# Patient Record
Sex: Female | Born: 1945 | Race: White | Hispanic: No | Marital: Married | Smoking: Former smoker
Health system: Southern US, Community
[De-identification: ages and names within clinical notes are randomized; demographics above are authoritative.]

## PROBLEM LIST (undated history)

## (undated) DIAGNOSIS — C719 Malignant neoplasm of brain, unspecified: Secondary | ICD-10-CM

## (undated) DIAGNOSIS — I10 Essential (primary) hypertension: Secondary | ICD-10-CM

## (undated) HISTORY — DX: Malignant neoplasm of brain, unspecified: C71.9

---

## 2011-09-11 DIAGNOSIS — E785 Hyperlipidemia, unspecified: Secondary | ICD-10-CM | POA: Diagnosis not present

## 2011-09-11 DIAGNOSIS — I10 Essential (primary) hypertension: Secondary | ICD-10-CM | POA: Diagnosis not present

## 2012-03-11 DIAGNOSIS — F17200 Nicotine dependence, unspecified, uncomplicated: Secondary | ICD-10-CM | POA: Diagnosis not present

## 2012-03-11 DIAGNOSIS — I10 Essential (primary) hypertension: Secondary | ICD-10-CM | POA: Diagnosis not present

## 2012-03-11 DIAGNOSIS — E785 Hyperlipidemia, unspecified: Secondary | ICD-10-CM | POA: Diagnosis not present

## 2012-04-17 DIAGNOSIS — H52229 Regular astigmatism, unspecified eye: Secondary | ICD-10-CM | POA: Diagnosis not present

## 2012-04-17 DIAGNOSIS — H5200 Hypermetropia, unspecified eye: Secondary | ICD-10-CM | POA: Diagnosis not present

## 2012-04-17 DIAGNOSIS — H524 Presbyopia: Secondary | ICD-10-CM | POA: Diagnosis not present

## 2012-04-17 DIAGNOSIS — H40019 Open angle with borderline findings, low risk, unspecified eye: Secondary | ICD-10-CM | POA: Diagnosis not present

## 2012-04-28 DIAGNOSIS — H524 Presbyopia: Secondary | ICD-10-CM | POA: Diagnosis not present

## 2012-04-28 DIAGNOSIS — H40019 Open angle with borderline findings, low risk, unspecified eye: Secondary | ICD-10-CM | POA: Diagnosis not present

## 2014-04-08 DIAGNOSIS — E785 Hyperlipidemia, unspecified: Secondary | ICD-10-CM | POA: Diagnosis not present

## 2014-04-08 DIAGNOSIS — I10 Essential (primary) hypertension: Secondary | ICD-10-CM | POA: Diagnosis not present

## 2014-04-11 DIAGNOSIS — L299 Pruritus, unspecified: Secondary | ICD-10-CM | POA: Diagnosis not present

## 2014-04-11 DIAGNOSIS — E785 Hyperlipidemia, unspecified: Secondary | ICD-10-CM | POA: Diagnosis not present

## 2014-04-11 DIAGNOSIS — I10 Essential (primary) hypertension: Secondary | ICD-10-CM | POA: Diagnosis not present

## 2014-06-07 DIAGNOSIS — I10 Essential (primary) hypertension: Secondary | ICD-10-CM | POA: Diagnosis not present

## 2014-10-31 DIAGNOSIS — E782 Mixed hyperlipidemia: Secondary | ICD-10-CM | POA: Diagnosis not present

## 2014-10-31 DIAGNOSIS — R7301 Impaired fasting glucose: Secondary | ICD-10-CM | POA: Diagnosis not present

## 2014-10-31 DIAGNOSIS — I10 Essential (primary) hypertension: Secondary | ICD-10-CM | POA: Diagnosis not present

## 2014-11-02 DIAGNOSIS — I10 Essential (primary) hypertension: Secondary | ICD-10-CM | POA: Diagnosis not present

## 2014-11-02 DIAGNOSIS — E782 Mixed hyperlipidemia: Secondary | ICD-10-CM | POA: Diagnosis not present

## 2015-05-11 DIAGNOSIS — I10 Essential (primary) hypertension: Secondary | ICD-10-CM | POA: Diagnosis not present

## 2015-05-11 DIAGNOSIS — E782 Mixed hyperlipidemia: Secondary | ICD-10-CM | POA: Diagnosis not present

## 2015-05-16 DIAGNOSIS — E782 Mixed hyperlipidemia: Secondary | ICD-10-CM | POA: Diagnosis not present

## 2015-05-16 DIAGNOSIS — R7301 Impaired fasting glucose: Secondary | ICD-10-CM | POA: Diagnosis not present

## 2015-05-16 DIAGNOSIS — I10 Essential (primary) hypertension: Secondary | ICD-10-CM | POA: Diagnosis not present

## 2015-08-10 DIAGNOSIS — H02834 Dermatochalasis of left upper eyelid: Secondary | ICD-10-CM | POA: Diagnosis not present

## 2015-11-17 DIAGNOSIS — I10 Essential (primary) hypertension: Secondary | ICD-10-CM | POA: Diagnosis not present

## 2015-11-17 DIAGNOSIS — R7301 Impaired fasting glucose: Secondary | ICD-10-CM | POA: Diagnosis not present

## 2015-11-17 DIAGNOSIS — E782 Mixed hyperlipidemia: Secondary | ICD-10-CM | POA: Diagnosis not present

## 2015-11-21 DIAGNOSIS — E782 Mixed hyperlipidemia: Secondary | ICD-10-CM | POA: Diagnosis not present

## 2015-11-21 DIAGNOSIS — I10 Essential (primary) hypertension: Secondary | ICD-10-CM | POA: Diagnosis not present

## 2016-05-16 DIAGNOSIS — E782 Mixed hyperlipidemia: Secondary | ICD-10-CM | POA: Diagnosis not present

## 2016-05-16 DIAGNOSIS — I10 Essential (primary) hypertension: Secondary | ICD-10-CM | POA: Diagnosis not present

## 2016-05-16 DIAGNOSIS — R7301 Impaired fasting glucose: Secondary | ICD-10-CM | POA: Diagnosis not present

## 2016-05-20 DIAGNOSIS — E663 Overweight: Secondary | ICD-10-CM | POA: Diagnosis not present

## 2016-05-20 DIAGNOSIS — R7301 Impaired fasting glucose: Secondary | ICD-10-CM | POA: Diagnosis not present

## 2016-05-20 DIAGNOSIS — Z87891 Personal history of nicotine dependence: Secondary | ICD-10-CM | POA: Diagnosis not present

## 2016-05-20 DIAGNOSIS — E782 Mixed hyperlipidemia: Secondary | ICD-10-CM | POA: Diagnosis not present

## 2016-05-20 DIAGNOSIS — I10 Essential (primary) hypertension: Secondary | ICD-10-CM | POA: Diagnosis not present

## 2016-09-10 DIAGNOSIS — H02411 Mechanical ptosis of right eyelid: Secondary | ICD-10-CM | POA: Diagnosis not present

## 2016-11-04 DIAGNOSIS — H02831 Dermatochalasis of right upper eyelid: Secondary | ICD-10-CM | POA: Diagnosis not present

## 2016-11-04 DIAGNOSIS — H02411 Mechanical ptosis of right eyelid: Secondary | ICD-10-CM | POA: Diagnosis not present

## 2016-11-04 DIAGNOSIS — H02412 Mechanical ptosis of left eyelid: Secondary | ICD-10-CM | POA: Diagnosis not present

## 2016-11-04 DIAGNOSIS — H02413 Mechanical ptosis of bilateral eyelids: Secondary | ICD-10-CM | POA: Diagnosis not present

## 2016-11-04 DIAGNOSIS — H02834 Dermatochalasis of left upper eyelid: Secondary | ICD-10-CM | POA: Diagnosis not present

## 2016-11-14 DIAGNOSIS — R7301 Impaired fasting glucose: Secondary | ICD-10-CM | POA: Diagnosis not present

## 2016-11-14 DIAGNOSIS — E782 Mixed hyperlipidemia: Secondary | ICD-10-CM | POA: Diagnosis not present

## 2016-11-18 DIAGNOSIS — R7301 Impaired fasting glucose: Secondary | ICD-10-CM | POA: Diagnosis not present

## 2016-11-18 DIAGNOSIS — I10 Essential (primary) hypertension: Secondary | ICD-10-CM | POA: Diagnosis not present

## 2016-11-18 DIAGNOSIS — Z6824 Body mass index (BMI) 24.0-24.9, adult: Secondary | ICD-10-CM | POA: Diagnosis not present

## 2016-11-18 DIAGNOSIS — E663 Overweight: Secondary | ICD-10-CM | POA: Diagnosis not present

## 2016-11-18 DIAGNOSIS — E782 Mixed hyperlipidemia: Secondary | ICD-10-CM | POA: Diagnosis not present

## 2017-01-22 DIAGNOSIS — Z0000 Encounter for general adult medical examination without abnormal findings: Secondary | ICD-10-CM | POA: Diagnosis not present

## 2017-03-31 ENCOUNTER — Other Ambulatory Visit: Payer: Self-pay

## 2017-05-19 DIAGNOSIS — R7301 Impaired fasting glucose: Secondary | ICD-10-CM | POA: Diagnosis not present

## 2017-05-19 DIAGNOSIS — I10 Essential (primary) hypertension: Secondary | ICD-10-CM | POA: Diagnosis not present

## 2017-07-02 DIAGNOSIS — Z23 Encounter for immunization: Secondary | ICD-10-CM | POA: Diagnosis not present

## 2017-07-02 DIAGNOSIS — M545 Low back pain: Secondary | ICD-10-CM | POA: Diagnosis not present

## 2017-07-21 DIAGNOSIS — Z6828 Body mass index (BMI) 28.0-28.9, adult: Secondary | ICD-10-CM | POA: Diagnosis not present

## 2017-07-21 DIAGNOSIS — M545 Low back pain: Secondary | ICD-10-CM | POA: Diagnosis not present

## 2017-09-24 DIAGNOSIS — Z6828 Body mass index (BMI) 28.0-28.9, adult: Secondary | ICD-10-CM | POA: Diagnosis not present

## 2017-09-24 DIAGNOSIS — H9313 Tinnitus, bilateral: Secondary | ICD-10-CM | POA: Diagnosis not present

## 2017-10-03 DIAGNOSIS — H9312 Tinnitus, left ear: Secondary | ICD-10-CM | POA: Diagnosis not present

## 2017-10-03 DIAGNOSIS — H903 Sensorineural hearing loss, bilateral: Secondary | ICD-10-CM | POA: Diagnosis not present

## 2017-11-17 DIAGNOSIS — Z23 Encounter for immunization: Secondary | ICD-10-CM | POA: Diagnosis not present

## 2017-11-17 DIAGNOSIS — H9313 Tinnitus, bilateral: Secondary | ICD-10-CM | POA: Diagnosis not present

## 2017-11-17 DIAGNOSIS — M545 Low back pain: Secondary | ICD-10-CM | POA: Diagnosis not present

## 2017-11-17 DIAGNOSIS — R7301 Impaired fasting glucose: Secondary | ICD-10-CM | POA: Diagnosis not present

## 2017-11-17 DIAGNOSIS — Z6828 Body mass index (BMI) 28.0-28.9, adult: Secondary | ICD-10-CM | POA: Diagnosis not present

## 2017-11-17 DIAGNOSIS — E782 Mixed hyperlipidemia: Secondary | ICD-10-CM | POA: Diagnosis not present

## 2017-11-19 DIAGNOSIS — M545 Low back pain: Secondary | ICD-10-CM | POA: Diagnosis not present

## 2017-11-19 DIAGNOSIS — R7301 Impaired fasting glucose: Secondary | ICD-10-CM | POA: Diagnosis not present

## 2017-11-19 DIAGNOSIS — I10 Essential (primary) hypertension: Secondary | ICD-10-CM | POA: Diagnosis not present

## 2017-11-19 DIAGNOSIS — Z6828 Body mass index (BMI) 28.0-28.9, adult: Secondary | ICD-10-CM | POA: Diagnosis not present

## 2017-11-19 DIAGNOSIS — L2089 Other atopic dermatitis: Secondary | ICD-10-CM | POA: Diagnosis not present

## 2017-11-19 DIAGNOSIS — E782 Mixed hyperlipidemia: Secondary | ICD-10-CM | POA: Diagnosis not present

## 2017-11-19 DIAGNOSIS — H9313 Tinnitus, bilateral: Secondary | ICD-10-CM | POA: Diagnosis not present

## 2018-05-19 DIAGNOSIS — I10 Essential (primary) hypertension: Secondary | ICD-10-CM | POA: Diagnosis not present

## 2018-05-19 DIAGNOSIS — Z23 Encounter for immunization: Secondary | ICD-10-CM | POA: Diagnosis not present

## 2018-05-19 DIAGNOSIS — R7301 Impaired fasting glucose: Secondary | ICD-10-CM | POA: Diagnosis not present

## 2018-05-19 DIAGNOSIS — H9313 Tinnitus, bilateral: Secondary | ICD-10-CM | POA: Diagnosis not present

## 2018-05-19 DIAGNOSIS — L2089 Other atopic dermatitis: Secondary | ICD-10-CM | POA: Diagnosis not present

## 2018-05-19 DIAGNOSIS — E782 Mixed hyperlipidemia: Secondary | ICD-10-CM | POA: Diagnosis not present

## 2018-05-19 DIAGNOSIS — M545 Low back pain: Secondary | ICD-10-CM | POA: Diagnosis not present

## 2018-05-19 DIAGNOSIS — Z6828 Body mass index (BMI) 28.0-28.9, adult: Secondary | ICD-10-CM | POA: Diagnosis not present

## 2018-05-22 DIAGNOSIS — L408 Other psoriasis: Secondary | ICD-10-CM | POA: Diagnosis not present

## 2018-05-22 DIAGNOSIS — Z23 Encounter for immunization: Secondary | ICD-10-CM | POA: Diagnosis not present

## 2018-05-22 DIAGNOSIS — I10 Essential (primary) hypertension: Secondary | ICD-10-CM | POA: Diagnosis not present

## 2018-05-22 DIAGNOSIS — E782 Mixed hyperlipidemia: Secondary | ICD-10-CM | POA: Diagnosis not present

## 2018-05-22 DIAGNOSIS — Z6828 Body mass index (BMI) 28.0-28.9, adult: Secondary | ICD-10-CM | POA: Diagnosis not present

## 2018-05-22 DIAGNOSIS — R7301 Impaired fasting glucose: Secondary | ICD-10-CM | POA: Diagnosis not present

## 2018-05-22 DIAGNOSIS — H9313 Tinnitus, bilateral: Secondary | ICD-10-CM | POA: Diagnosis not present

## 2018-05-22 DIAGNOSIS — M545 Low back pain: Secondary | ICD-10-CM | POA: Diagnosis not present

## 2018-11-30 DIAGNOSIS — L259 Unspecified contact dermatitis, unspecified cause: Secondary | ICD-10-CM | POA: Diagnosis not present

## 2018-11-30 DIAGNOSIS — I10 Essential (primary) hypertension: Secondary | ICD-10-CM | POA: Diagnosis not present

## 2019-01-22 DIAGNOSIS — M5430 Sciatica, unspecified side: Secondary | ICD-10-CM | POA: Diagnosis not present

## 2019-01-28 DIAGNOSIS — Z0000 Encounter for general adult medical examination without abnormal findings: Secondary | ICD-10-CM | POA: Diagnosis not present

## 2019-02-23 DIAGNOSIS — E782 Mixed hyperlipidemia: Secondary | ICD-10-CM | POA: Diagnosis not present

## 2019-02-23 DIAGNOSIS — R7301 Impaired fasting glucose: Secondary | ICD-10-CM | POA: Diagnosis not present

## 2019-02-23 DIAGNOSIS — I10 Essential (primary) hypertension: Secondary | ICD-10-CM | POA: Diagnosis not present

## 2019-03-02 DIAGNOSIS — I10 Essential (primary) hypertension: Secondary | ICD-10-CM | POA: Diagnosis not present

## 2019-03-02 DIAGNOSIS — E663 Overweight: Secondary | ICD-10-CM | POA: Diagnosis not present

## 2019-03-02 DIAGNOSIS — Z6827 Body mass index (BMI) 27.0-27.9, adult: Secondary | ICD-10-CM | POA: Diagnosis not present

## 2019-03-02 DIAGNOSIS — R21 Rash and other nonspecific skin eruption: Secondary | ICD-10-CM | POA: Diagnosis not present

## 2019-03-02 DIAGNOSIS — R7303 Prediabetes: Secondary | ICD-10-CM | POA: Diagnosis not present

## 2019-03-02 DIAGNOSIS — E782 Mixed hyperlipidemia: Secondary | ICD-10-CM | POA: Diagnosis not present

## 2019-03-02 DIAGNOSIS — R7301 Impaired fasting glucose: Secondary | ICD-10-CM | POA: Diagnosis not present

## 2019-04-02 ENCOUNTER — Other Ambulatory Visit: Payer: Self-pay

## 2019-06-04 DIAGNOSIS — Z23 Encounter for immunization: Secondary | ICD-10-CM | POA: Diagnosis not present

## 2019-06-11 DIAGNOSIS — M5432 Sciatica, left side: Secondary | ICD-10-CM | POA: Diagnosis not present

## 2019-10-07 DIAGNOSIS — Z23 Encounter for immunization: Secondary | ICD-10-CM | POA: Diagnosis not present

## 2019-11-05 DIAGNOSIS — Z23 Encounter for immunization: Secondary | ICD-10-CM | POA: Diagnosis not present

## 2020-02-02 DIAGNOSIS — H52203 Unspecified astigmatism, bilateral: Secondary | ICD-10-CM | POA: Diagnosis not present

## 2020-02-02 DIAGNOSIS — H5203 Hypermetropia, bilateral: Secondary | ICD-10-CM | POA: Diagnosis not present

## 2020-02-02 DIAGNOSIS — H2513 Age-related nuclear cataract, bilateral: Secondary | ICD-10-CM | POA: Diagnosis not present

## 2020-02-24 ENCOUNTER — Other Ambulatory Visit (HOSPITAL_COMMUNITY): Payer: Self-pay | Admitting: Internal Medicine

## 2020-02-24 DIAGNOSIS — Z13820 Encounter for screening for osteoporosis: Secondary | ICD-10-CM

## 2020-03-08 ENCOUNTER — Encounter (HOSPITAL_COMMUNITY): Payer: Self-pay

## 2020-03-08 ENCOUNTER — Ambulatory Visit (HOSPITAL_COMMUNITY): Payer: Medicare Other

## 2020-06-08 DIAGNOSIS — Z23 Encounter for immunization: Secondary | ICD-10-CM | POA: Diagnosis not present

## 2020-06-23 DIAGNOSIS — Z23 Encounter for immunization: Secondary | ICD-10-CM | POA: Diagnosis not present

## 2020-08-22 DIAGNOSIS — R7301 Impaired fasting glucose: Secondary | ICD-10-CM | POA: Diagnosis not present

## 2020-08-22 DIAGNOSIS — Z6827 Body mass index (BMI) 27.0-27.9, adult: Secondary | ICD-10-CM | POA: Diagnosis not present

## 2020-08-22 DIAGNOSIS — R21 Rash and other nonspecific skin eruption: Secondary | ICD-10-CM | POA: Diagnosis not present

## 2020-08-22 DIAGNOSIS — M5432 Sciatica, left side: Secondary | ICD-10-CM | POA: Diagnosis not present

## 2020-08-22 DIAGNOSIS — Z6828 Body mass index (BMI) 28.0-28.9, adult: Secondary | ICD-10-CM | POA: Diagnosis not present

## 2020-08-22 DIAGNOSIS — I10 Essential (primary) hypertension: Secondary | ICD-10-CM | POA: Diagnosis not present

## 2020-08-22 DIAGNOSIS — E663 Overweight: Secondary | ICD-10-CM | POA: Diagnosis not present

## 2020-08-22 DIAGNOSIS — L408 Other psoriasis: Secondary | ICD-10-CM | POA: Diagnosis not present

## 2020-08-22 DIAGNOSIS — E782 Mixed hyperlipidemia: Secondary | ICD-10-CM | POA: Diagnosis not present

## 2020-08-22 DIAGNOSIS — R7303 Prediabetes: Secondary | ICD-10-CM | POA: Diagnosis not present

## 2020-08-22 DIAGNOSIS — Z6829 Body mass index (BMI) 29.0-29.9, adult: Secondary | ICD-10-CM | POA: Diagnosis not present

## 2020-08-22 DIAGNOSIS — R944 Abnormal results of kidney function studies: Secondary | ICD-10-CM | POA: Diagnosis not present

## 2020-08-29 DIAGNOSIS — R944 Abnormal results of kidney function studies: Secondary | ICD-10-CM | POA: Diagnosis not present

## 2020-08-29 DIAGNOSIS — E663 Overweight: Secondary | ICD-10-CM | POA: Diagnosis not present

## 2020-08-29 DIAGNOSIS — E559 Vitamin D deficiency, unspecified: Secondary | ICD-10-CM | POA: Diagnosis not present

## 2020-08-29 DIAGNOSIS — M549 Dorsalgia, unspecified: Secondary | ICD-10-CM | POA: Diagnosis not present

## 2020-08-29 DIAGNOSIS — R7303 Prediabetes: Secondary | ICD-10-CM | POA: Diagnosis not present

## 2020-08-29 DIAGNOSIS — E782 Mixed hyperlipidemia: Secondary | ICD-10-CM | POA: Diagnosis not present

## 2020-08-29 DIAGNOSIS — Z6829 Body mass index (BMI) 29.0-29.9, adult: Secondary | ICD-10-CM | POA: Diagnosis not present

## 2020-08-29 DIAGNOSIS — I10 Essential (primary) hypertension: Secondary | ICD-10-CM | POA: Diagnosis not present

## 2020-12-28 ENCOUNTER — Encounter (INDEPENDENT_AMBULATORY_CARE_PROVIDER_SITE_OTHER): Payer: Self-pay | Admitting: *Deleted

## 2020-12-29 DIAGNOSIS — Z23 Encounter for immunization: Secondary | ICD-10-CM | POA: Diagnosis not present

## 2021-02-22 DIAGNOSIS — E782 Mixed hyperlipidemia: Secondary | ICD-10-CM | POA: Diagnosis not present

## 2021-02-22 DIAGNOSIS — R7302 Impaired glucose tolerance (oral): Secondary | ICD-10-CM | POA: Diagnosis not present

## 2021-02-22 DIAGNOSIS — E559 Vitamin D deficiency, unspecified: Secondary | ICD-10-CM | POA: Diagnosis not present

## 2021-02-22 DIAGNOSIS — I10 Essential (primary) hypertension: Secondary | ICD-10-CM | POA: Diagnosis not present

## 2021-02-27 DIAGNOSIS — R110 Nausea: Secondary | ICD-10-CM | POA: Diagnosis not present

## 2021-02-27 DIAGNOSIS — E663 Overweight: Secondary | ICD-10-CM | POA: Diagnosis not present

## 2021-02-27 DIAGNOSIS — R7303 Prediabetes: Secondary | ICD-10-CM | POA: Diagnosis not present

## 2021-02-27 DIAGNOSIS — I10 Essential (primary) hypertension: Secondary | ICD-10-CM | POA: Diagnosis not present

## 2021-02-27 DIAGNOSIS — E782 Mixed hyperlipidemia: Secondary | ICD-10-CM | POA: Diagnosis not present

## 2021-02-27 DIAGNOSIS — E559 Vitamin D deficiency, unspecified: Secondary | ICD-10-CM | POA: Diagnosis not present

## 2021-02-27 DIAGNOSIS — M5451 Vertebrogenic low back pain: Secondary | ICD-10-CM | POA: Diagnosis not present

## 2021-02-27 DIAGNOSIS — F419 Anxiety disorder, unspecified: Secondary | ICD-10-CM | POA: Diagnosis not present

## 2021-02-27 DIAGNOSIS — R944 Abnormal results of kidney function studies: Secondary | ICD-10-CM | POA: Diagnosis not present

## 2021-03-06 ENCOUNTER — Other Ambulatory Visit (HOSPITAL_COMMUNITY): Payer: Self-pay

## 2021-03-06 ENCOUNTER — Emergency Department (HOSPITAL_COMMUNITY): Payer: Medicare Other

## 2021-03-06 ENCOUNTER — Inpatient Hospital Stay (HOSPITAL_COMMUNITY): Payer: Medicare Other

## 2021-03-06 ENCOUNTER — Inpatient Hospital Stay (HOSPITAL_COMMUNITY)
Admission: EM | Admit: 2021-03-06 | Discharge: 2021-03-17 | DRG: 023 | Disposition: A | Discharge status: Home Health | Payer: Medicare Other | Attending: Internal Medicine | Admitting: Internal Medicine

## 2021-03-06 DIAGNOSIS — R569 Unspecified convulsions: Secondary | ICD-10-CM | POA: Diagnosis not present

## 2021-03-06 DIAGNOSIS — Z87891 Personal history of nicotine dependence: Secondary | ICD-10-CM | POA: Diagnosis not present

## 2021-03-06 DIAGNOSIS — R404 Transient alteration of awareness: Secondary | ICD-10-CM | POA: Diagnosis not present

## 2021-03-06 DIAGNOSIS — R4189 Other symptoms and signs involving cognitive functions and awareness: Secondary | ICD-10-CM

## 2021-03-06 DIAGNOSIS — J9601 Acute respiratory failure with hypoxia: Secondary | ICD-10-CM | POA: Insufficient documentation

## 2021-03-06 DIAGNOSIS — R4020 Unspecified coma: Secondary | ICD-10-CM | POA: Diagnosis not present

## 2021-03-06 DIAGNOSIS — K7689 Other specified diseases of liver: Secondary | ICD-10-CM | POA: Diagnosis not present

## 2021-03-06 DIAGNOSIS — B9689 Other specified bacterial agents as the cause of diseases classified elsewhere: Secondary | ICD-10-CM | POA: Diagnosis present

## 2021-03-06 DIAGNOSIS — D496 Neoplasm of unspecified behavior of brain: Secondary | ICD-10-CM | POA: Diagnosis not present

## 2021-03-06 DIAGNOSIS — E876 Hypokalemia: Secondary | ICD-10-CM | POA: Diagnosis not present

## 2021-03-06 DIAGNOSIS — C719 Malignant neoplasm of brain, unspecified: Secondary | ICD-10-CM

## 2021-03-06 DIAGNOSIS — D72829 Elevated white blood cell count, unspecified: Secondary | ICD-10-CM | POA: Diagnosis not present

## 2021-03-06 DIAGNOSIS — I959 Hypotension, unspecified: Secondary | ICD-10-CM | POA: Diagnosis not present

## 2021-03-06 DIAGNOSIS — R930 Abnormal findings on diagnostic imaging of skull and head, not elsewhere classified: Secondary | ICD-10-CM | POA: Diagnosis not present

## 2021-03-06 DIAGNOSIS — R0689 Other abnormalities of breathing: Secondary | ICD-10-CM | POA: Diagnosis not present

## 2021-03-06 DIAGNOSIS — Z01818 Encounter for other preprocedural examination: Secondary | ICD-10-CM | POA: Diagnosis not present

## 2021-03-06 DIAGNOSIS — G40901 Epilepsy, unspecified, not intractable, with status epilepticus: Secondary | ICD-10-CM

## 2021-03-06 DIAGNOSIS — R739 Hyperglycemia, unspecified: Secondary | ICD-10-CM | POA: Diagnosis present

## 2021-03-06 DIAGNOSIS — J9811 Atelectasis: Secondary | ICD-10-CM | POA: Diagnosis not present

## 2021-03-06 DIAGNOSIS — M47812 Spondylosis without myelopathy or radiculopathy, cervical region: Secondary | ICD-10-CM | POA: Diagnosis present

## 2021-03-06 DIAGNOSIS — G9340 Encephalopathy, unspecified: Secondary | ICD-10-CM | POA: Diagnosis present

## 2021-03-06 DIAGNOSIS — J432 Centrilobular emphysema: Secondary | ICD-10-CM | POA: Diagnosis present

## 2021-03-06 DIAGNOSIS — H5347 Heteronymous bilateral field defects: Secondary | ICD-10-CM | POA: Diagnosis present

## 2021-03-06 DIAGNOSIS — Z20822 Contact with and (suspected) exposure to covid-19: Secondary | ICD-10-CM | POA: Diagnosis present

## 2021-03-06 DIAGNOSIS — G4089 Other seizures: Secondary | ICD-10-CM | POA: Diagnosis not present

## 2021-03-06 DIAGNOSIS — E042 Nontoxic multinodular goiter: Secondary | ICD-10-CM | POA: Diagnosis present

## 2021-03-06 DIAGNOSIS — R0902 Hypoxemia: Secondary | ICD-10-CM | POA: Diagnosis not present

## 2021-03-06 DIAGNOSIS — R937 Abnormal findings on diagnostic imaging of other parts of musculoskeletal system: Secondary | ICD-10-CM | POA: Diagnosis not present

## 2021-03-06 DIAGNOSIS — D432 Neoplasm of uncertain behavior of brain, unspecified: Secondary | ICD-10-CM | POA: Diagnosis not present

## 2021-03-06 DIAGNOSIS — R0603 Acute respiratory distress: Secondary | ICD-10-CM | POA: Diagnosis not present

## 2021-03-06 DIAGNOSIS — I6523 Occlusion and stenosis of bilateral carotid arteries: Secondary | ICD-10-CM | POA: Diagnosis not present

## 2021-03-06 DIAGNOSIS — G060 Intracranial abscess and granuloma: Secondary | ICD-10-CM | POA: Diagnosis not present

## 2021-03-06 DIAGNOSIS — R112 Nausea with vomiting, unspecified: Secondary | ICD-10-CM | POA: Diagnosis not present

## 2021-03-06 DIAGNOSIS — Z6829 Body mass index (BMI) 29.0-29.9, adult: Secondary | ICD-10-CM

## 2021-03-06 DIAGNOSIS — R7881 Bacteremia: Secondary | ICD-10-CM | POA: Diagnosis present

## 2021-03-06 DIAGNOSIS — I639 Cerebral infarction, unspecified: Secondary | ICD-10-CM | POA: Diagnosis not present

## 2021-03-06 DIAGNOSIS — I10 Essential (primary) hypertension: Secondary | ICD-10-CM | POA: Diagnosis present

## 2021-03-06 DIAGNOSIS — G9389 Other specified disorders of brain: Secondary | ICD-10-CM | POA: Diagnosis not present

## 2021-03-06 DIAGNOSIS — G936 Cerebral edema: Secondary | ICD-10-CM | POA: Diagnosis present

## 2021-03-06 DIAGNOSIS — I160 Hypertensive urgency: Secondary | ICD-10-CM | POA: Diagnosis present

## 2021-03-06 DIAGNOSIS — J9690 Respiratory failure, unspecified, unspecified whether with hypoxia or hypercapnia: Secondary | ICD-10-CM | POA: Diagnosis not present

## 2021-03-06 DIAGNOSIS — N281 Cyst of kidney, acquired: Secondary | ICD-10-CM | POA: Diagnosis not present

## 2021-03-06 DIAGNOSIS — R000 Tachycardia, unspecified: Secondary | ICD-10-CM | POA: Diagnosis not present

## 2021-03-06 DIAGNOSIS — J90 Pleural effusion, not elsewhere classified: Secondary | ICD-10-CM | POA: Diagnosis not present

## 2021-03-06 DIAGNOSIS — I517 Cardiomegaly: Secondary | ICD-10-CM | POA: Diagnosis not present

## 2021-03-06 DIAGNOSIS — G939 Disorder of brain, unspecified: Secondary | ICD-10-CM

## 2021-03-06 HISTORY — DX: Essential (primary) hypertension: I10

## 2021-03-06 LAB — CBG MONITORING, ED: Glucose-Capillary: 217 mg/dL — ABNORMAL HIGH (ref 70–99)

## 2021-03-06 LAB — CBC
HCT: 44.4 % (ref 36.0–46.0)
Hemoglobin: 13.9 g/dL (ref 12.0–15.0)
MCH: 32.1 pg (ref 26.0–34.0)
MCHC: 31.3 g/dL (ref 30.0–36.0)
MCV: 102.5 fL — ABNORMAL HIGH (ref 80.0–100.0)
Platelets: 380 10*3/uL (ref 150–400)
RBC: 4.33 MIL/uL (ref 3.87–5.11)
RDW: 13.2 % (ref 11.5–15.5)
WBC: 20.2 10*3/uL — ABNORMAL HIGH (ref 4.0–10.5)
nRBC: 0 % (ref 0.0–0.2)

## 2021-03-06 LAB — DIFFERENTIAL
Abs Immature Granulocytes: 0.12 10*3/uL — ABNORMAL HIGH (ref 0.00–0.07)
Basophils Absolute: 0.1 10*3/uL (ref 0.0–0.1)
Basophils Relative: 1 %
Eosinophils Absolute: 0.1 10*3/uL (ref 0.0–0.5)
Eosinophils Relative: 0 %
Immature Granulocytes: 1 %
Lymphocytes Relative: 32 %
Lymphs Abs: 6.4 10*3/uL — ABNORMAL HIGH (ref 0.7–4.0)
Monocytes Absolute: 1.1 10*3/uL — ABNORMAL HIGH (ref 0.1–1.0)
Monocytes Relative: 6 %
Neutro Abs: 12.5 10*3/uL — ABNORMAL HIGH (ref 1.7–7.7)
Neutrophils Relative %: 60 %

## 2021-03-06 LAB — I-STAT CHEM 8, ED
BUN: 26 mg/dL — ABNORMAL HIGH (ref 8–23)
Calcium, Ion: 1.1 mmol/L — ABNORMAL LOW (ref 1.15–1.40)
Chloride: 103 mmol/L (ref 98–111)
Creatinine, Ser: 0.9 mg/dL (ref 0.44–1.00)
Glucose, Bld: 213 mg/dL — ABNORMAL HIGH (ref 70–99)
HCT: 45 % (ref 36.0–46.0)
Hemoglobin: 15.3 g/dL — ABNORMAL HIGH (ref 12.0–15.0)
Potassium: 3 mmol/L — ABNORMAL LOW (ref 3.5–5.1)
Sodium: 137 mmol/L (ref 135–145)
TCO2: 14 mmol/L — ABNORMAL LOW (ref 22–32)

## 2021-03-06 LAB — COMPREHENSIVE METABOLIC PANEL
ALT: 22 U/L (ref 0–44)
AST: 37 U/L (ref 15–41)
Albumin: 3.9 g/dL (ref 3.5–5.0)
Alkaline Phosphatase: 59 U/L (ref 38–126)
Anion gap: 29 — ABNORMAL HIGH (ref 5–15)
BUN: 19 mg/dL (ref 8–23)
CO2: 9 mmol/L — ABNORMAL LOW (ref 22–32)
Calcium: 9.4 mg/dL (ref 8.9–10.3)
Chloride: 99 mmol/L (ref 98–111)
Creatinine, Ser: 1.22 mg/dL — ABNORMAL HIGH (ref 0.44–1.00)
GFR, Estimated: 46 mL/min — ABNORMAL LOW (ref >60)
Glucose, Bld: 219 mg/dL — ABNORMAL HIGH (ref 70–99)
Potassium: 2.9 mmol/L — ABNORMAL LOW (ref 3.5–5.1)
Sodium: 137 mmol/L (ref 135–145)
Total Bilirubin: 0.7 mg/dL (ref 0.3–1.2)
Total Protein: 7.3 g/dL (ref 6.5–8.1)

## 2021-03-06 LAB — I-STAT ARTERIAL BLOOD GAS, ED
Acid-base deficit: 6 mmol/L — ABNORMAL HIGH (ref 0.0–2.0)
Bicarbonate: 21.7 mmol/L (ref 20.0–28.0)
Calcium, Ion: 1.19 mmol/L (ref 1.15–1.40)
HCT: 39 % (ref 36.0–46.0)
Hemoglobin: 13.3 g/dL (ref 12.0–15.0)
O2 Saturation: 100 %
Patient temperature: 96.4
Potassium: 2.8 mmol/L — ABNORMAL LOW (ref 3.5–5.1)
Sodium: 135 mmol/L (ref 135–145)
TCO2: 23 mmol/L (ref 22–32)
pCO2 arterial: 46.9 mmHg (ref 32.0–48.0)
pH, Arterial: 7.268 — ABNORMAL LOW (ref 7.350–7.450)
pO2, Arterial: 477 mmHg — ABNORMAL HIGH (ref 83.0–108.0)

## 2021-03-06 LAB — HEMOGLOBIN A1C
Hgb A1c MFr Bld: 6 % — ABNORMAL HIGH (ref 4.8–5.6)
Mean Plasma Glucose: 125.5 mg/dL

## 2021-03-06 LAB — BASIC METABOLIC PANEL
Anion gap: 10 (ref 5–15)
BUN: 16 mg/dL (ref 8–23)
CO2: 22 mmol/L (ref 22–32)
Calcium: 8.5 mg/dL — ABNORMAL LOW (ref 8.9–10.3)
Chloride: 103 mmol/L (ref 98–111)
Creatinine, Ser: 0.99 mg/dL (ref 0.44–1.00)
GFR, Estimated: 59 mL/min — ABNORMAL LOW (ref >60)
Glucose, Bld: 156 mg/dL — ABNORMAL HIGH (ref 70–99)
Potassium: 2.9 mmol/L — ABNORMAL LOW (ref 3.5–5.1)
Sodium: 135 mmol/L (ref 135–145)

## 2021-03-06 LAB — PROTIME-INR
INR: 1.1 (ref 0.8–1.2)
Prothrombin Time: 14.1 seconds (ref 11.4–15.2)

## 2021-03-06 LAB — MAGNESIUM: Magnesium: 2.2 mg/dL (ref 1.7–2.4)

## 2021-03-06 LAB — APTT: aPTT: 22 seconds — ABNORMAL LOW (ref 24–36)

## 2021-03-06 LAB — GLUCOSE, CAPILLARY: Glucose-Capillary: 107 mg/dL — ABNORMAL HIGH (ref 70–99)

## 2021-03-06 IMAGING — CT CT HEAD CODE STROKE
4 series · 16 of 47 positions shown, 18 images · non-contrast
Comparison: No pertinent prior exams available for comparison.

CLINICAL DATA: Code stroke. Neuro deficit, acute, stroke suspected.
Additional history provided: Stroke-like symptoms.

EXAM:
CT HEAD WITHOUT CONTRAST
TECHNIQUE: Contiguous axial images were obtained from the base of the skull
through the vertex without intravenous contrast.

[Series 2: head wo · axial · 0.46mm/px · z∈[+1464,+1584]mm · 7 of 33 slices shown, 9 images]
[im 5/33  brain]
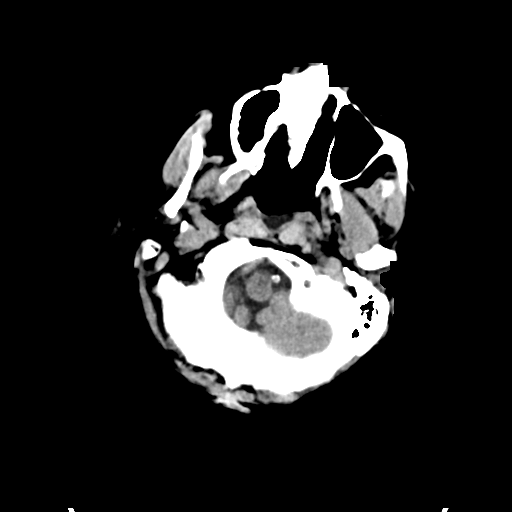
[im 5/33  bone]
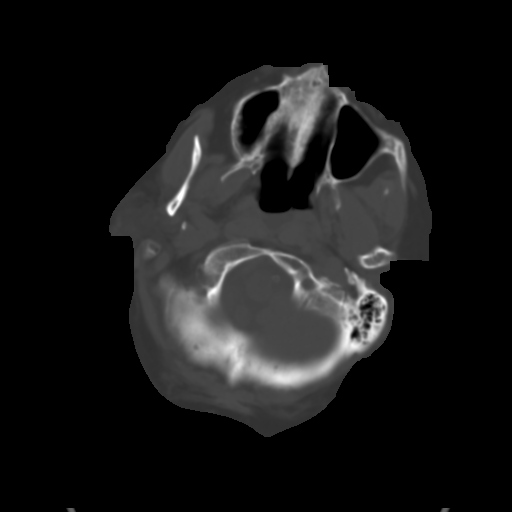
[im 9/33  brain]
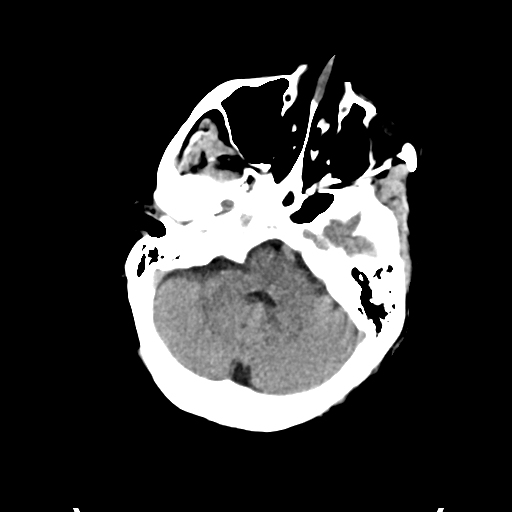
[im 13/33  brain]
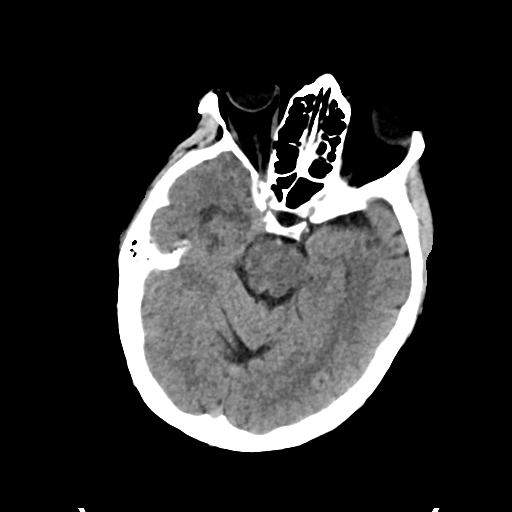
[im 17/33  brain]
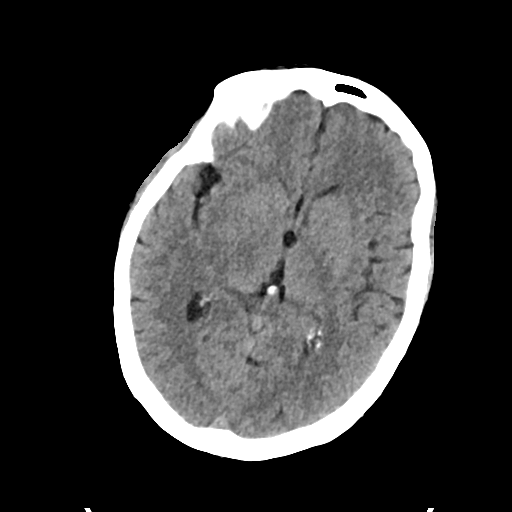
[im 21/33  brain]
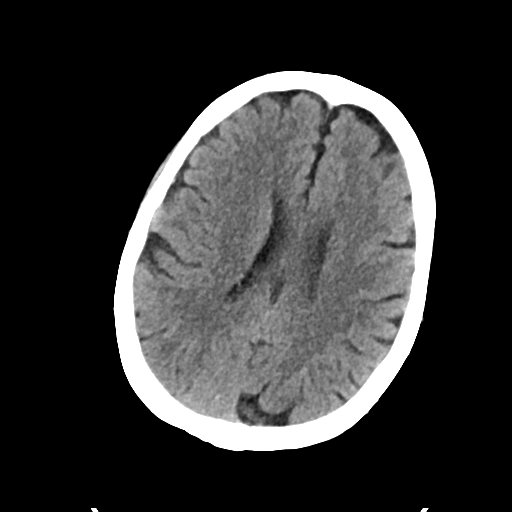
[im 21/33  bone]
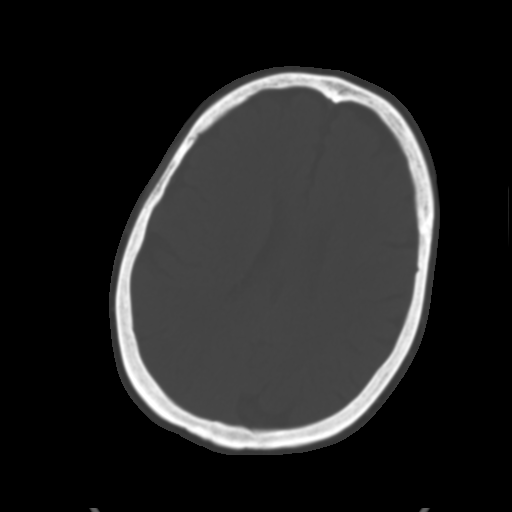
[im 25/33  brain]
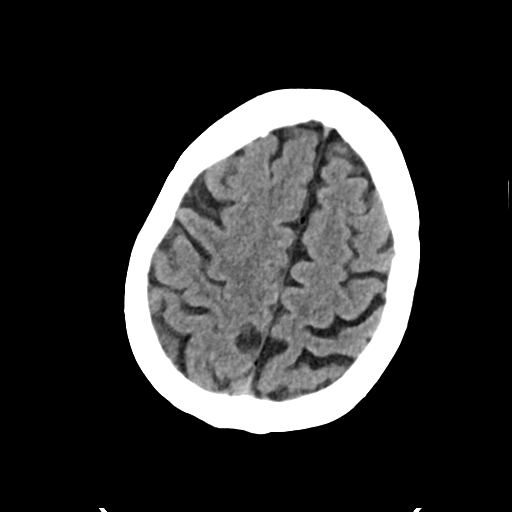
[im 29/33  brain]
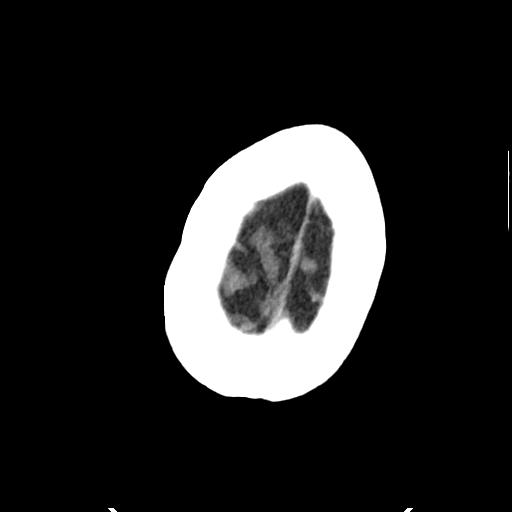

[Series 3: head bone · axial · 0.46mm/px · z∈[+1460,+1492]mm · 3 of 82 slices shown]
[im 9/82  bone]
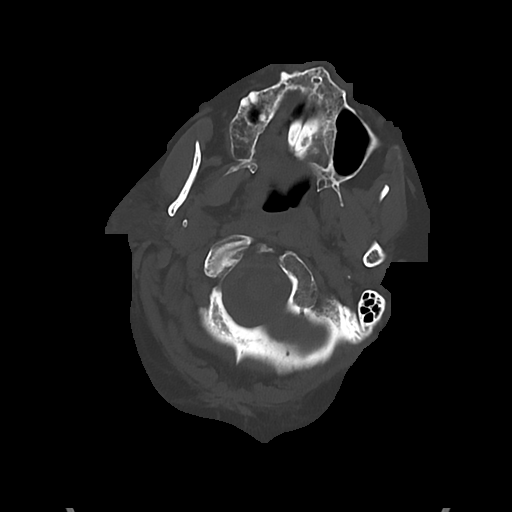
[im 17/82  bone]
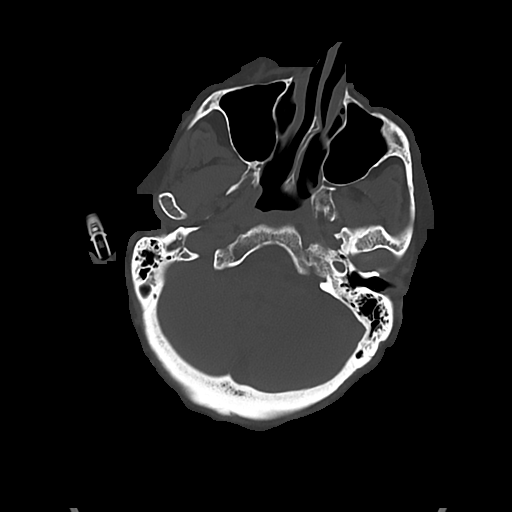
[im 25/82  bone]
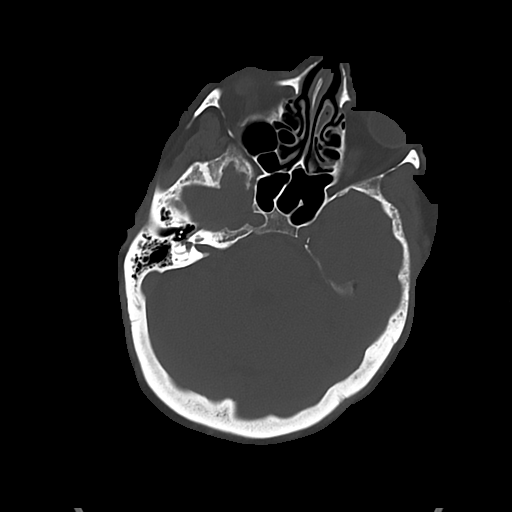

[Series 5: cor soft · coronal · 0.34mm/px · 3 of 70 slices shown]
[im 24/70  brain]
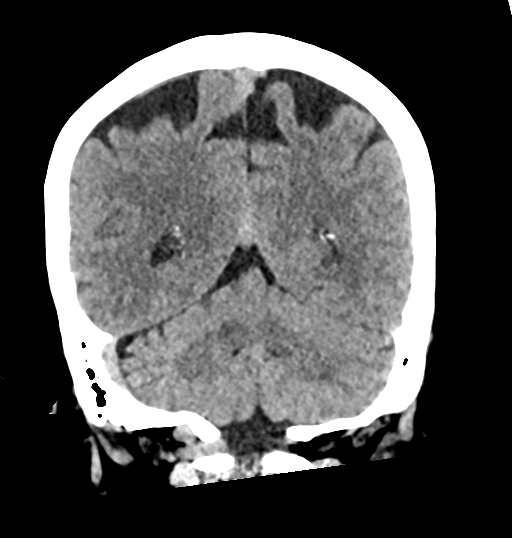
[im 31/70  brain]
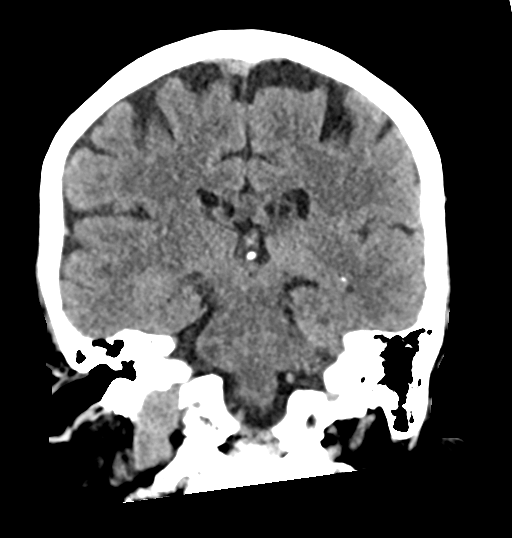
[im 39/70  brain]
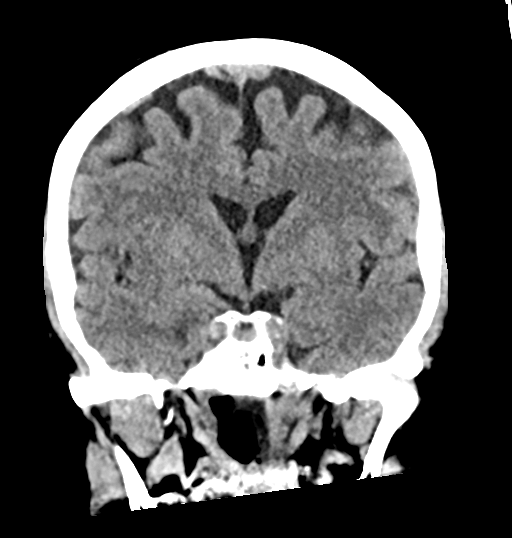

[Series 6: sag soft · sagittal · 0.37mm/px · 3 of 59 slices shown]
[im 22/59  brain]
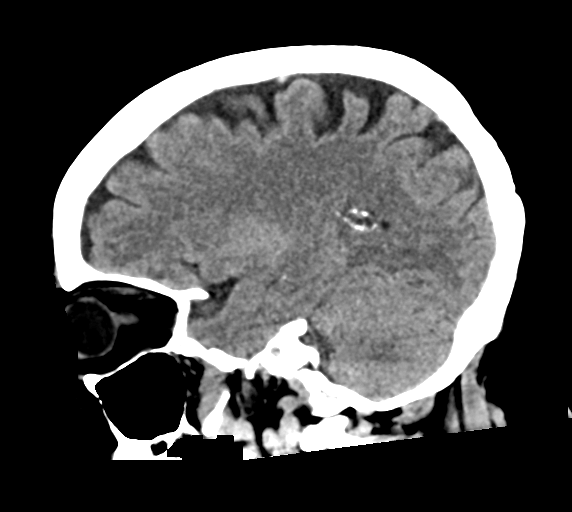
[im 30/59  brain]
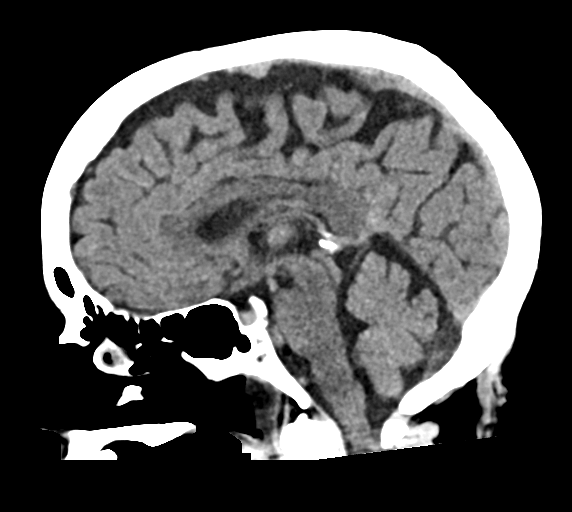
[im 38/59  brain]
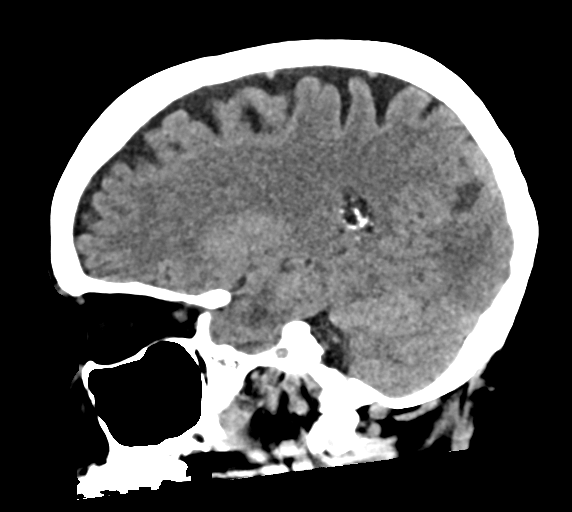

[16 of 47 positions shown; findings below may reference images not displayed]

FINDINGS: Brain:

Mild generalized cerebral atrophy.

Apparent abnormal hypodensity within the anteromedial right temporal
lobe/right hippocampus (for instance as seen on series 5, image 33).
This may reflect an acute right PCA territory infarct. However,
alternative considerations such as seizure related edema,
encephalitis or an underlying mass cannot be excluded.

An additional small focus of cortical hypodensity is questioned
within the lateral right occipital lobe (series 2, image 15). This
may reflect a small right MCA/PCA watershed territory infarct.

No evidence of acute intracranial hemorrhage.

No extra-axial fluid collection.

No evidence of an intracranial mass.

No midline shift.

Vascular: No hyperdense vessel.

Skull: Normal. Negative for fracture or focal lesion.

Sinuses/Orbits: Visualized orbits show no acute finding. Trace
bilateral ethmoid sinus mucosal thickening.

ASPECTS (Alberta Stroke Program Early CT Score)

- Ganglionic level infarction (caudate, lentiform nuclei, internal
capsule, insula, M1-M3 cortex): Five

- Supraganglionic infarction (M4-M6 cortex): 3

Total score (0-10 with 10 being normal): 9

These results were called by telephone at the time of interpretation
on [DATE] at [DATE] to provider PREISNER, who verbally
acknowledged these results.
IMPRESSION: Apparent abnormal hypodensity within the anteromedial right temporal
lobe/right hippocampus. This may reflect an acute right PCA
territory infarct. However, alternative considerations such as
seizure related edema, encephalitis or an underlying mass cannot be
excluded. A contrast-enhanced brain MRI is recommended for further
evaluation.

An additional small focus of cortical hypodensity is questioned
within the lateral right occipital lobe, which may reflect an acute
right MCA/PCA territory watershed infarct.

ASPECTS is 8.

Mild generalized cerebral atrophy.

## 2021-03-06 IMAGING — DX DG CHEST 1V PORT
1 series · 1 of 1 positions shown · non-contrast
Comparison: Portable exam [OJ] hours without priors for comparison

CLINICAL DATA: Post intubation, code stroke

EXAM:
PORTABLE CHEST 1 VIEW

[chest ap]
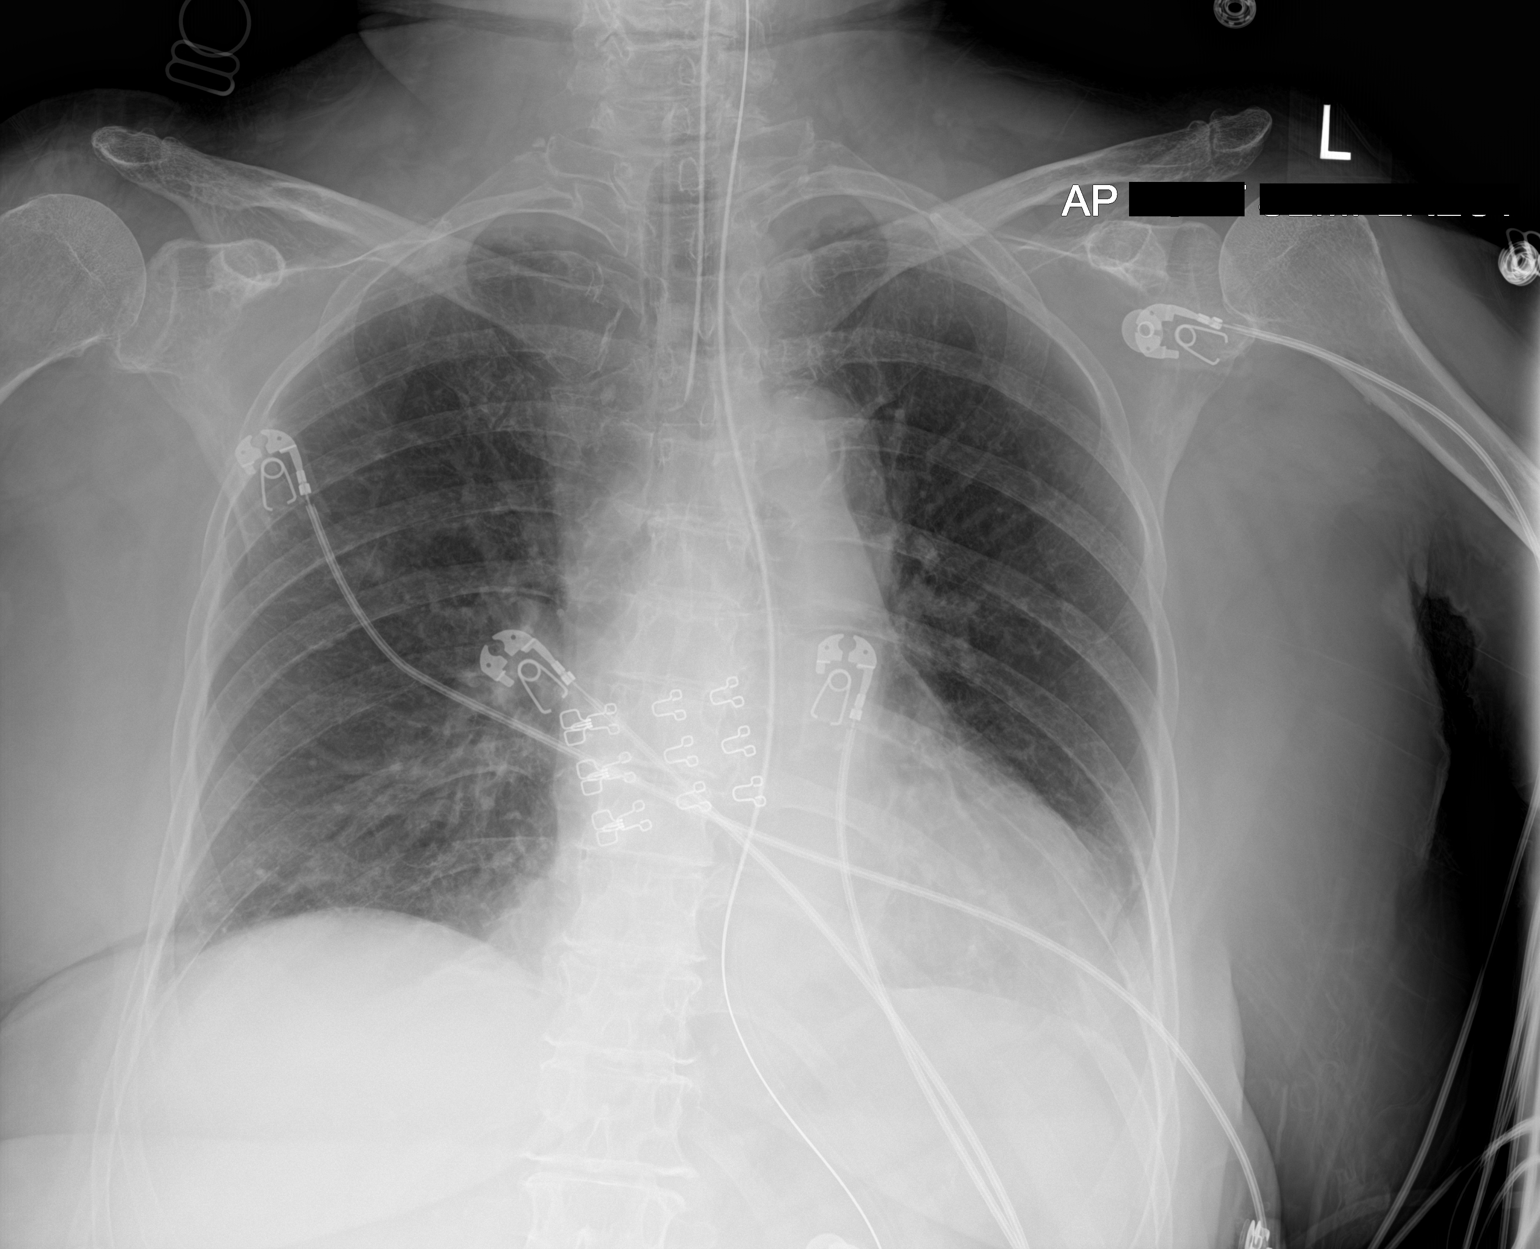

[1 of 1 positions shown; findings below may reference images not displayed]

FINDINGS: Tip of endotracheal tube projects 3.7 cm above carina.

Nasogastric tube extends into stomach.

Normal heart size, mediastinal contours, and pulmonary vascularity.

Atherosclerotic calcification aorta.

Minimal bibasilar atelectasis.

Lungs otherwise clear.

No pulmonary infiltrate, pleural effusion, or pneumothorax.

Bones demineralized.
IMPRESSION: Minimal bibasilar atelectasis.

Aortic Atherosclerosis ([OJ]-[OJ]).

## 2021-03-06 MED ORDER — LEVETIRACETAM IN NACL 1000 MG/100ML IV SOLN
1000.0000 mg | Freq: Two times a day (BID) | INTRAVENOUS | Status: DC
  Administered 2021-03-06 – 2021-03-08 (×4): 1000 mg via INTRAVENOUS
  Filled 2021-03-06 (×4): qty 100

## 2021-03-06 MED ORDER — INSULIN ASPART 100 UNIT/ML IJ SOLN
0.0000 [IU] | INTRAMUSCULAR | Status: DC
  Administered 2021-03-06: 5 [IU] via SUBCUTANEOUS
  Administered 2021-03-08 – 2021-03-09 (×2): 3 [IU] via SUBCUTANEOUS
  Administered 2021-03-09: 2 [IU] via SUBCUTANEOUS
  Administered 2021-03-10 – 2021-03-11 (×4): 3 [IU] via SUBCUTANEOUS

## 2021-03-06 MED ORDER — PROPOFOL 1000 MG/100ML IV EMUL
5.0000 ug/kg/min | INTRAVENOUS | Status: DC
Start: 2021-03-06 — End: 2021-03-08
  Administered 2021-03-06: 5 ug/kg/min via INTRAVENOUS
  Administered 2021-03-07: 15 ug/kg/min via INTRAVENOUS
  Filled 2021-03-06 (×2): qty 100

## 2021-03-06 MED ORDER — DEXTROSE 5 % IV SOLN
10.0000 mg/kg | Freq: Two times a day (BID) | INTRAVENOUS | Status: DC
  Administered 2021-03-06 – 2021-03-07 (×2): 840 mg via INTRAVENOUS
  Filled 2021-03-06 (×3): qty 16.8

## 2021-03-06 MED ORDER — ROCURONIUM BROMIDE 50 MG/5ML IV SOLN
INTRAVENOUS | Status: AC | PRN
  Administered 2021-03-06: 80 mg via INTRAVENOUS

## 2021-03-06 MED ORDER — ETOMIDATE 2 MG/ML IV SOLN
INTRAVENOUS | Status: AC | PRN
  Administered 2021-03-06: 20 mg via INTRAVENOUS

## 2021-03-06 MED ORDER — FENTANYL CITRATE (PF) 100 MCG/2ML IJ SOLN
25.0000 ug | INTRAMUSCULAR | Status: DC | PRN
Start: 2021-03-06 — End: 2021-03-08

## 2021-03-06 MED ORDER — SODIUM CHLORIDE 0.9 % IV SOLN: INTRAVENOUS | Status: DC

## 2021-03-06 MED ORDER — IOHEXOL 350 MG/ML SOLN
100.0000 mL | Freq: Once | INTRAVENOUS | Status: AC | PRN
  Administered 2021-03-06: 100 mL via INTRAVENOUS

## 2021-03-06 MED ORDER — DOCUSATE SODIUM 100 MG PO CAPS
100.0000 mg | ORAL_CAPSULE | Freq: Two times a day (BID) | ORAL | Status: DC | PRN
  Administered 2021-03-08: 100 mg via ORAL
  Filled 2021-03-06: qty 1

## 2021-03-06 MED ORDER — SODIUM CHLORIDE 0.9% FLUSH
3.0000 mL | Freq: Once | INTRAVENOUS | Status: DC
Start: 2021-03-06 — End: 2021-03-17

## 2021-03-06 MED ORDER — PROPOFOL 1000 MG/100ML IV EMUL: 0.0000 ug/kg/min | INTRAVENOUS | Status: DC

## 2021-03-06 MED ORDER — LEVETIRACETAM IN NACL 1000 MG/100ML IV SOLN
1000.0000 mg | Freq: Once | INTRAVENOUS | Status: AC
  Administered 2021-03-06: 1000 mg via INTRAVENOUS

## 2021-03-06 MED ORDER — ORAL CARE MOUTH RINSE
15.0000 mL | OROMUCOSAL | Status: DC
  Administered 2021-03-06 – 2021-03-07 (×6): 15 mL via OROMUCOSAL

## 2021-03-06 MED ORDER — POTASSIUM CHLORIDE 20 MEQ PO PACK
80.0000 meq | PACK | Freq: Every day | ORAL | Status: DC
  Administered 2021-03-06 – 2021-03-07 (×2): 80 meq
  Filled 2021-03-06: qty 4

## 2021-03-06 MED ORDER — POLYETHYLENE GLYCOL 3350 17 G PO PACK
17.0000 g | PACK | Freq: Every day | ORAL | Status: DC
  Administered 2021-03-07 – 2021-03-10 (×2): 17 g
  Filled 2021-03-06 (×4): qty 1

## 2021-03-06 MED ORDER — NICARDIPINE HCL IN NACL 20-0.86 MG/200ML-% IV SOLN
3.0000 mg/h | INTRAVENOUS | Status: DC
  Administered 2021-03-06: 5 mg/h via INTRAVENOUS
  Filled 2021-03-06: qty 200

## 2021-03-06 MED ORDER — SODIUM CHLORIDE 0.9 % IV SOLN: 2000.0000 mg | INTRAVENOUS | Status: DC

## 2021-03-06 MED ORDER — PROPOFOL 1000 MG/100ML IV EMUL
5.0000 ug/kg/min | INTRAVENOUS | Status: DC
  Administered 2021-03-06: 20 ug/kg/min via INTRAVENOUS

## 2021-03-06 MED ORDER — CHLORHEXIDINE GLUCONATE CLOTH 2 % EX PADS
6.0000 | MEDICATED_PAD | Freq: Every day | CUTANEOUS | Status: DC
  Administered 2021-03-07 – 2021-03-17 (×10): 6 via TOPICAL

## 2021-03-06 MED ORDER — DOCUSATE SODIUM 50 MG/5ML PO LIQD
100.0000 mg | Freq: Two times a day (BID) | ORAL | Status: DC
  Administered 2021-03-06 – 2021-03-07 (×3): 100 mg
  Filled 2021-03-06 (×3): qty 10

## 2021-03-06 MED ORDER — PANTOPRAZOLE SODIUM 40 MG IV SOLR
40.0000 mg | Freq: Every day | INTRAVENOUS | Status: DC
  Administered 2021-03-06: 40 mg via INTRAVENOUS
  Filled 2021-03-06: qty 40

## 2021-03-06 MED ORDER — SODIUM CHLORIDE 0.9 % IV BOLUS
500.0000 mL | Freq: Once | INTRAVENOUS | Status: AC
  Administered 2021-03-06: 500 mL via INTRAVENOUS

## 2021-03-06 MED ORDER — HEPARIN SODIUM (PORCINE) 5000 UNIT/ML IJ SOLN
5000.0000 [IU] | Freq: Three times a day (TID) | INTRAMUSCULAR | Status: DC
  Administered 2021-03-06 – 2021-03-07 (×2): 5000 [IU] via SUBCUTANEOUS
  Filled 2021-03-06 (×2): qty 1

## 2021-03-06 MED ORDER — POLYETHYLENE GLYCOL 3350 17 G PO PACK
17.0000 g | PACK | Freq: Every day | ORAL | Status: DC | PRN
  Administered 2021-03-14: 17 g via ORAL

## 2021-03-06 MED ORDER — FENTANYL CITRATE (PF) 100 MCG/2ML IJ SOLN
25.0000 ug | INTRAMUSCULAR | Status: DC | PRN
Start: 2021-03-06 — End: 2021-03-08
  Administered 2021-03-06 – 2021-03-07 (×6): 100 ug via INTRAVENOUS
  Administered 2021-03-07: 50 ug via INTRAVENOUS
  Filled 2021-03-06 (×8): qty 2

## 2021-03-06 MED ORDER — CHLORHEXIDINE GLUCONATE 0.12% ORAL RINSE (MEDLINE KIT)
15.0000 mL | Freq: Two times a day (BID) | OROMUCOSAL | Status: DC
  Administered 2021-03-06 – 2021-03-16 (×18): 15 mL via OROMUCOSAL

## 2021-03-06 NOTE — ED Notes (Signed)
Pt becoming agitated and trying to pull at her lines. Pt placed in soft restraints and provider contacted to put the order in.

## 2021-03-06 NOTE — ED Notes (Signed)
Patient transported to CT 

## 2021-03-06 NOTE — ED Notes (Signed)
Report called to RN receiving pt in [id number].

## 2021-03-06 NOTE — H&P (Addendum)
NAME:  Andrea [name], MRN:  [id number], DOB:  14-Oct-1945, LOS: 0 ADMISSION DATE:  03/06/2021, CONSULTATION DATE:  03/06/21 REFERRING MD:  Dina Rich CHIEF COMPLAINT:  AMS, Seizures   History of Present Illness:  Andrea [name] is a 75 y.o. female who [place]  Highway patrol was called and they witnessed 3 seizures on arrival.  She received 5mg  Versed en route to hospital by EMS. Upon arrival to [facility] ED, she was sonorous and minimally responsive and was therefore intubated.  CT head showed abnormal hypodensity within the anteromedial right temporal lobe/right hippocampus felt to be 2/2 acute right PCA territory infarct vs seizure related edema vs encephalitis vs underlying mass.  Brain MRI is pending. Neurology is on board and has started pt on Acyclovir. She was also started on Cardene gtt due to hypertension and tachycardia.  PCCM asked to admit to neuro ICU.  Per daughter, pt has a long standing smoking hx and quit 4 - 5 years ago (at least some 50 pack years or so history).  Recently she has been having mild memory issues but nothing too concerning.  No prior hx of seizures.  No hx malignancy.  She is UTD on mammograms.   Pertinent  Medical History:  does not have a problem list on file.  Significant Hospital Events: Including procedures, antibiotic start and stop dates in addition to other pertinent events   7/5 > admit.  Interim History / Subjective:  On vent, opens eyes to voice.  Objective:  Blood pressure (!) 214/120, pulse (!) 135, temperature (!) 96.4 F (35.8 C), temperature source Temporal, resp. rate 18, height 5' 6.5" (1.689 m), weight 83.9 kg, SpO2 100 %.    Vent Mode: PRVC FiO2 (%):  [100 %] 100 % Set Rate:  [18 bmp] 18 bmp Vt Set:  [480 mL] 480 mL PEEP:  [5 cmH20] 5 cmH20  No intake or output data in the 24 hours ending 03/06/21 1906 Filed Weights   03/06/21 1834  Weight: 83.9 kg    Examination: General: Adult female, resting  in bed, in NAD. Neuro: Sedated but opens eyes to voice, moves all extremities. HEENT: [place]/AT. Sclerae anicteric. ETT In place. Cardiovascular: Tachy, regular, no M/R/G.  Lungs: Respirations even and unlabored.  CTA bilaterally, No W/R/R.  Abdomen: BS x 4, soft, NT/ND.  Musculoskeletal: No gross deformities, no edema.  Skin: Intact, warm, no rashes.  Labs/imaging personally reviewed:  CT head 7/5 > abnormal hypodensity within the anteromedial right temporal lobe/right hippocampus felt to be 2/2 acute right PCA territory infarct vs seizure related edema vs encephalitis vs underlying mass.   MRI brain 7/5 >  EEG 7/5 >    Resolved Hospital Problem list:   Assessment & Plan:   Respiratory insufficiency - due to inability to protect the airway in the setting of seizures. - Full vent support. - Assess ABG. - Wean as able. - Daily SBT. - Bronchial hygiene. - Follow CXR.  Seizures. - AED's per neuro. - EEG per neuro. - F/u on MRI brain.  Abnormal hypodensity anteromedial right temporal lobe. - F/u on brain MRI. - Empiric Acyclovir per neuro for now. - Might need LP pending MRI. - If MRI shows mass, would need chest imaging given her hx of smoking and concern for primary lung CA.  Hypertensive urgency. - Continue Cardene gtt, goal SBP 160.  Hypokalemia. - 80 mEq K per tube. - Follow BMP.  Hyperglycemia. - SSI. -  Assess Hgb A1c.   Best practice (evaluated daily):  Diet/type: NPO DVT prophylaxis: prophylactic heparin  GI prophylaxis: PPI Lines: N/A Foley:  N/A Code Status:  full code Last date of multidisciplinary goals of care discussion: None yet.  Labs   CBC: Recent Labs  Lab 03/06/21 1802 03/06/21 1806 03/06/21 1851  WBC 20.2*  --   --   NEUTROABS 12.5*  --   --   HGB 13.9 15.3* 13.3  HCT 44.4 45.0 39.0  MCV 102.5*  --   --   PLT 380  --   --     Basic Metabolic Panel: Recent Labs  Lab 03/06/21 1802 03/06/21 1806 03/06/21 1851  NA PENDING 137 135   K PENDING 3.0* 2.8*  CL PENDING 103  --   CO2 PENDING  --   --   GLUCOSE 219* 213*  --   BUN 19 26*  --   CREATININE 1.22* 0.90  --   CALCIUM 9.4  --   --    GFR: Estimated Creatinine Clearance: 59.6 mL/min (by C-G formula based on SCr of 0.9 mg/dL). Recent Labs  Lab 03/06/21 1802  WBC 20.2*    Liver Function Tests: Recent Labs  Lab 03/06/21 1802  AST 37  ALT PENDING  ALKPHOS 59  BILITOT 0.7  PROT 7.3  ALBUMIN 3.9   No results for input(s): LIPASE, AMYLASE in the last 168 hours. No results for input(s): AMMONIA in the last 168 hours.  ABG    Component Value Date/Time   PHART 7.268 (L) 03/06/2021 1851   PCO2ART 46.9 03/06/2021 1851   PO2ART 477 (H) 03/06/2021 1851   HCO3 21.7 03/06/2021 1851   TCO2 23 03/06/2021 1851   ACIDBASEDEF 6.0 (H) 03/06/2021 1851   O2SAT 100.0 03/06/2021 1851     Coagulation Profile: Recent Labs  Lab 03/06/21 1802  INR 1.1    Cardiac Enzymes: No results for input(s): CKTOTAL, CKMB, CKMBINDEX, TROPONINI in the last 168 hours.  HbA1C: No results found for: HGBA1C  CBG: No results for input(s): GLUCAP in the last 168 hours.  Review of Systems:   Unable to obtain as pt is encephalopathic.  Past Medical History:  She,  has no past medical history on file.   Surgical History:  Not available.  Social History:      Family History:  Her family history is not on file.   Allergies Not on File   Home Medications  Prior to Admission medications   Not on File     Critical care time: 45 min.   Montey Hora, [facility] For pager details, please see AMION or use Epic chat  After 1900, please call [facility] for cross coverage needs 03/06/2021, 7:06 PM   Pulmonary critical care attending:  This is a 75 year old female, past medical history of tobacco abuse, 50+ years, hypertension.  Patient was found unresponsive in her vehicle.  There was 3 witnessed seizures upon arrival.  She was  given Versed in route.  Patient was intubated placed on ventilator.  Patient's initial head CT showed anterior medial right temporal lobe and right hippocampus defect concerning for a possible edema, encephalitis, or underlying mass.  BP 106/79   Pulse 100   Temp (!) 96.4 F (35.8 C) (Temporal)   Resp 19   Ht 5' 6.5" (1.689 m)   Wt 83.9 kg   SpO2 100%   BMI 29.41 kg/m   Gen: elderly FM, HENT: tracking appropriately,  ETT in place, NCAT  Lungs: BL vented breaths  Heart: RRR, s1 s2  Abd: soft nt nd  Labs reviewed  A: AHRF on MV  Acute Seizure  Abnormal hypodensity anteromedial right temporal lobe  - if concern for mass, but if Hypertensive urgency  Hypokalemia  P: Full vent support  AEDS per neuro EEG pending  MRI pending  Acyclovir per neuro  Pending MRI results if mass would consider CT chest to rule out primary  Replete K   This patient is critically ill with multiple organ system failure; which, requires frequent high complexity decision making, assessment, support, evaluation, and titration of therapies. This was completed through the application of advanced monitoring technologies and extensive interpretation of multiple databases. During this encounter critical care time was devoted to patient care services described in this note for 48 minutes.  Garner Nash, DO [facility] 03/06/2021 9:10 PM

## 2021-03-06 NOTE — ED Provider Notes (Signed)
[facility] EMERGENCY DEPARTMENT Provider Note   CSN: [id number] Arrival date & time: 03/06/21  1752     History No chief complaint on file.   Brihanna Devenport is a 75 y.o. female.  HPI  75 year old female with unknown past medical history presents the emergency department unresponsive.  Report from EMS is that the patient was found unresponsive sitting in her car in the middle of the highway.  When EMS got there she had 2 witnessed seizures, given Versed in route.  Had concern for not moving the left side of the body.  On arrival patient is unresponsive, sonorous respirations, tachycardic and hypertensive.  Intubated on arrival, neurology team at bedside for potential code stroke.  No past medical history on file.  There are no problems to display for this patient.   The histories are not reviewed yet. Please review them in the "History" navigator section and refresh this [place].   OB History   No obstetric history on file.     No family history on file.     Home Medications Prior to Admission medications   Not on File    Allergies    Patient has no allergy information on record.  Review of Systems   Review of Systems  Unable to perform ROS: Intubated   Physical Exam Updated Vital Signs BP (!) 202/106   Pulse (!) 130   Temp (!) 96.4 F (35.8 C) (Temporal)   Resp 17   Ht 5' 6.5" (1.689 m)   SpO2 100%   Physical Exam Vitals and nursing note reviewed.  Constitutional:      Comments: Unresponsive  HENT:     Head: Normocephalic and atraumatic.     Mouth/Throat:     Mouth: Mucous membranes are moist.  Eyes:     Pupils: Pupils are equal, round, and reactive to light.  Cardiovascular:     Rate and Rhythm: Normal rate.  Pulmonary:     Comments: Sonorous respirations, not protecting her airway Abdominal:     Palpations: Abdomen is soft.  Musculoskeletal:        General: No deformity.  Skin:    General: Skin is warm.  Neurological:      Comments: Unresponsive, at times appears to be posturing    ED Results / Procedures / Treatments   Labs (all labs ordered are listed, but only abnormal results are displayed) Labs Reviewed  CBC - Abnormal; Notable for the following components:      Result Value   WBC 20.2 (*)    MCV 102.5 (*)    All other components within normal limits  DIFFERENTIAL - Abnormal; Notable for the following components:   Neutro Abs 12.5 (*)    Lymphs Abs 6.4 (*)    Monocytes Absolute 1.1 (*)    Abs Immature Granulocytes 0.12 (*)    All other components within normal limits  I-STAT CHEM 8, ED - Abnormal; Notable for the following components:   Potassium 3.0 (*)    BUN 26 (*)    Glucose, Bld 213 (*)    Calcium, Ion 1.10 (*)    TCO2 14 (*)    Hemoglobin 15.3 (*)    All other components within normal limits  PROTIME-INR  APTT  COMPREHENSIVE METABOLIC PANEL  CBG MONITORING, ED    EKG None  Radiology No results found.  Procedures Procedure Name: Intubation Date/Time: 03/06/2021 6:24 PM Performed by: Lorelle Gibbs, DO Pre-anesthesia Checklist: Patient identified, Patient being monitored, Emergency Drugs  available, Timeout performed and Suction available Oxygen Delivery Method: Non-rebreather mask Preoxygenation: Pre-oxygenation with 100% oxygen Induction Type: Rapid sequence Ventilation: Mask ventilation without difficulty Laryngoscope Size: Glidescope and 4 Grade View: Grade IV Tube size: 7.5 mm Number of attempts: 1 Airway Equipment and Method: Video-laryngoscopy Placement Confirmation: ETT inserted through vocal cords under direct vision, CO2 detector, Breath sounds checked- equal and bilateral and Positive ETCO2 Tube secured with: ETT holder Difficulty Due To: Difficult Airway- due to anterior larynx    .Critical Care  Date/Time: 03/06/2021 7:45 PM Performed by: Lorelle Gibbs, DO Authorized by: Lorelle Gibbs, DO   Critical care provider statement:    Critical care  time (minutes):  45   Critical care was necessary to treat or prevent imminent or life-threatening deterioration of the following conditions:  CNS failure or compromise and respiratory failure   Critical care was time spent personally by me on the following activities:  Discussions with consultants, evaluation of patient's response to treatment, examination of patient, ordering and performing treatments and interventions, ordering and review of laboratory studies, ordering and review of radiographic studies, pulse oximetry, re-evaluation of patient's condition, obtaining history from patient or surrogate and review of old charts   I assumed direction of critical care for this patient from another provider in my specialty: no     Care discussed with: admitting provider     Medications Ordered in ED Medications  sodium chloride flush (NS) 0.9 % injection 3 mL (has no administration in time range)  propofol (DIPRIVAN) 1000 MG/100ML infusion (20 mcg/kg/min Intravenous New Bag/Given 03/06/21 1808)  etomidate (AMIDATE) injection (20 mg Intravenous Given 03/06/21 1800)  rocuronium (ZEMURON) injection (80 mg Intravenous Given 03/06/21 1800)    ED Course  I have reviewed the triage vital signs and the nursing notes.  Pertinent labs & imaging results that were available during my care of the patient were reviewed by me and considered in my medical decision making (see chart for details).    MDM Rules/Calculators/A&P                          75 year old female presents the emergency department after being found unresponsive.  Seizure activity reported prior to arrival with a dose of Versed.  She is unresponsive on arrival, sonorous respirations.  Intubated for airway protection.  She is noted to be tachycardic and hypertensive.  Initial CT imaging shows no bleed.  Perfusion study does not show an acute stroke.  There is concern from this imaging of a possible brain mass versus herpes findings.  Plan for ICU  admission, MRI to rule out stroke, EEG for seizure activity, will cover with acyclovir in the meantime.  Decision to perform lumbar puncture is pending the other studies.  Patient continued to be tachycardic and hypertensive, placed on a Cardene drip for further control.  Admitted to the ICU, husband at bedside has been made aware.  EEG is currently in session, patients evaluation and results requires admission for further treatment and care. Patient agrees with admission plan, offers no new complaints and is stable/unchanged at time of admit.  Final Clinical Impression(s) / ED Diagnoses Final diagnoses:  None    Rx / DC Orders ED Discharge Orders     None        Lorelle Gibbs, DO 03/06/21 1946

## 2021-03-06 NOTE — Progress Notes (Signed)
EEG complete - results pending 

## 2021-03-06 NOTE — Progress Notes (Signed)
Pharmacy Antibiotic Note  Andrea [name] is a 75 y.o. female admitted on 03/06/2021 presented as code stroke, seizures reported by EMS, concern for meningitis.  Pharmacy has been consulted for acyclovir dosing.  SCr 1.22, I-stat 0.9, will use CMP as more accurate for renal fxn eval, likely CrCl < 50  Plan: Acyclovir 10mg /kg (840mg ) IV q 12h Add IVF NS @125  Monitor renal function, HSV encephalitis workup and LOT  Height: 5' 6.5" (168.9 cm) Weight: 83.9 kg (185 lb) IBW/kg (Calculated) : 60.45  Temp (24hrs), Avg:96.4 F (35.8 C), Min:96.4 F (35.8 C), Max:96.4 F (35.8 C)  Recent Labs  Lab 03/06/21 1802 03/06/21 1806  WBC 20.2*  --   CREATININE  --  0.90    Estimated Creatinine Clearance: 59.6 mL/min (by C-G formula based on SCr of 0.9 mg/dL).    Not on File  Bertis Ruddy, PharmD Clinical Pharmacist ED Pharmacist Phone # [phone] 03/06/2021 6:47 PM

## 2021-03-06 NOTE — ED Triage Notes (Addendum)
Patient arrived by [facility] after being found unresponsive sitting in car [place] of [place]. Patient has had 3 seizures and received versed 5 pta. Patient arrived on NRBM with snoring respirations. EMS reports initially flaccid on left side but patient moving left side on arrival, appears some posturing with right hand

## 2021-03-06 NOTE — Consult Note (Addendum)
Neurology Consultation Reason for Consult: Seizure Referring Physician: Horton, C  CC: Seizures  History is obtained from:EMS  HPI: Cornesha Radziewicz is a 75 y.o. female with a history of hypertension, though other history is unknown who presents with new onset seizures.  She was driving erratically and therefore was pulled over by [employer] who witnessed seizures.  She was given Versed by EMS after further seizure activity was seen en route.  After this, it was noted that she was not moving her left side and therefore code stroke was activated.  On [place] the bridge, she was hemiplegic on the left with purposeful movements on the right.  She was not protecting her airway and therefore was intubated prior to going to CT.  CT/CTA/CTP did not reveal any evidence of a lesion that would be amenable to intervention.  There was some hypodensity in the mesial temporal lobe as well as right occipital lobe, of unclear etiology.   LKW: Noon tpa given?: no, outside of window   ROS:  Unable to obtain due to altered mental status.   PMH: limited by AMS Hypertension   FHx: Unable to assess secondary to patient's altered mental status.    Social History: Unable to assess secondary to patient's altered mental status.   Exam: Current vital signs: BP (!) 202/106   Pulse (!) 130   Temp (!) 96.4 F (35.8 C) (Temporal)   Resp 17   Ht 5' 6.5" (1.689 m)   SpO2 100%  Vital signs in last 24 hours: Temp:  [96.4 F (35.8 C)] 96.4 F (35.8 C) (07/05 1811) Pulse Rate:  [128-130] 130 (07/05 1806) Resp:  [17-25] 17 (07/05 1806) BP: (200-202)/(97-106) 202/106 (07/05 1806) SpO2:  [100 %] 100 % (07/05 1806)   Physical Exam  Constitutional: Appears well-developed and well-nourished.  Psych: Affect appropriate to situation Eyes: No scleral injection HENT: No OP obstruction MSK: no joint deformities.  Cardiovascular: Normal rate and regular rhythm.  Respiratory: Effort normal, non-labored  breathing GI: Soft.  No distension. There is no tenderness.  Skin: WDI  Neuro: Mental Status: Patient is obtunded, she does not follow commands, eyes are partially open. Cranial Nerves: II: Unclear blink to threat. Pupils are  reactive, right is 5 mm, left is 3 mm III,IV, VI: She crosses midline in both directions V: She blinks to eyelid stimulation bilaterally VII: Facial movement with mild left facial weakness Motor: Tone is normal. Bulk is normal. 5/5 strength was present on the right, on the left she has minimal movement of her left hand, no movement of her left leg. Sensory: She response to noxious stimulation on the right but not on the left Cerebellar: She does not perform  I have reviewed labs in epic and the results pertinent to this consultation are: Sodium 137 Creatinine 1.22 Glucose 213 Calcium 9.4 WBC 20.2  I have reviewed the images obtained: CT head-hypodensity in the mesial temporal lobe on the right, no vascular lesions  Impression: 75 year old female with new onset seizures in the setting of abnormal head CT.  The multiple frequent seizures without return to baseline are consistent with status epilepticus. Though my suspicion for an infectious etiology is actually low, given the findings on CT I think that starting empiric acyclovir pending an MRI, or LP would be prudent.  More urgent, however, would be ruling out ongoing status epilepticus with an EEG and therefore I think this takes a top priority.  I would favor holding off on LP until after imaging,  and we can use empiric acyclovir until this is done.  I have very low suspicion for bacterial etiologies.  Other possibilities include pres, tumor, subacute infarct.  Recommendations: 1) stat EEG 2) MRI brain with and without contrast 3) Keppra 2 g x 1 followed by 1 g twice daily 4) Acyclovir pending MRI and/or LP 5) neurology will continue to follow  This patient is critically ill and at significant risk of  neurological worsening, death and care requires constant monitoring of vital signs, hemodynamics,respiratory and cardiac monitoring, neurological assessment, discussion with family, other specialists and medical decision making of high complexity. I spent 65 minutes of neurocritical care time  in the care of  this patient. This was time spent independent of any time provided by nurse practitioner or PA.  Roland Rack, MD [facility] [phone]  If 7pm- 7am, please page neurology on call as listed in [place]. 03/06/2021  7:16 PM

## 2021-03-06 NOTE — Progress Notes (Signed)
LTM EEG hooked up and running - no initial skin breakdown - push button tested - neuro notified. [facility] monitoring.  

## 2021-03-06 NOTE — Code Documentation (Signed)
Stroke Response Nurse Documentation Code Documentation  Andrea [name] is a 75 y.o. female arriving to [place] ED via [facility] on 03/06/2021 with past medical hx of HTN.. Code stroke was activated by EMS. Patient from home where she was LKW at noon. She was found in her car on the highway, highway patrol was called.  She had 3 witness seizures by highway patrol and EMS,  EMS gave 5mg  Versed enroute to hospital . On No antithrombotic. Stroke team at the bedside on patient arrival. Labs drawn and patient cleared for CT by Dr. Dina Rich.  Patient arrived with snoring respirations and was urgently intubated prior to CT.  Patient to CT with team. NIHSS 20, see documentation for details and code stroke times. Patient with decreased LOC, disoriented, not following commands, left facial droop, left arm weakness, left leg weakness, left decreased sensation, Global aphasia , and dysarthria  on exam. The following imaging was completed: CT, CTA head and neck, CTP. Patient is not a candidate for tPA due to LKW outside TPA window  RSI for intubation Propofol at 80mcg Keppra 2 gm given IV Cardene gtt started  Care/Plan. Bedside handoff with ED RN Kathlee Nations.    Raliegh Ip  Stroke Response RN

## 2021-03-06 NOTE — Progress Notes (Signed)
eLink Physician-Brief Progress Note Patient Name: Anisa Leanos DOB: 1946-01-19 MRN: [id number]   Date of Service  03/06/2021  HPI/Events of Note  Patient with hypotension after Propofol gtt was started.  eICU Interventions  Normal Saline 500 ml iv bolus x 1 ordered.        Kerry Kass Clotilda Hafer 03/06/2021, 11:25 PM

## 2021-03-07 ENCOUNTER — Inpatient Hospital Stay (HOSPITAL_COMMUNITY): Payer: Medicare Other

## 2021-03-07 DIAGNOSIS — R937 Abnormal findings on diagnostic imaging of other parts of musculoskeletal system: Secondary | ICD-10-CM

## 2021-03-07 DIAGNOSIS — E876 Hypokalemia: Secondary | ICD-10-CM

## 2021-03-07 DIAGNOSIS — R930 Abnormal findings on diagnostic imaging of skull and head, not elsewhere classified: Secondary | ICD-10-CM

## 2021-03-07 DIAGNOSIS — R4189 Other symptoms and signs involving cognitive functions and awareness: Secondary | ICD-10-CM

## 2021-03-07 DIAGNOSIS — R569 Unspecified convulsions: Secondary | ICD-10-CM | POA: Diagnosis not present

## 2021-03-07 LAB — BASIC METABOLIC PANEL
Anion gap: 7 (ref 5–15)
BUN: 15 mg/dL (ref 8–23)
CO2: 22 mmol/L (ref 22–32)
Calcium: 8 mg/dL — ABNORMAL LOW (ref 8.9–10.3)
Chloride: 109 mmol/L (ref 98–111)
Creatinine, Ser: 0.77 mg/dL (ref 0.44–1.00)
GFR, Estimated: >60 mL/min (ref >60)
Glucose, Bld: 81 mg/dL (ref 70–99)
Potassium: 3.3 mmol/L — ABNORMAL LOW (ref 3.5–5.1)
Sodium: 138 mmol/L (ref 135–145)

## 2021-03-07 LAB — POCT I-STAT 7, (LYTES, BLD GAS, ICA,H+H)
Acid-base deficit: 2 mmol/L (ref 0.0–2.0)
Bicarbonate: 23.6 mmol/L (ref 20.0–28.0)
Calcium, Ion: 1.21 mmol/L (ref 1.15–1.40)
HCT: 31 % — ABNORMAL LOW (ref 36.0–46.0)
Hemoglobin: 10.5 g/dL — ABNORMAL LOW (ref 12.0–15.0)
O2 Saturation: 99 %
Patient temperature: 37.1
Potassium: 3.2 mmol/L — ABNORMAL LOW (ref 3.5–5.1)
Sodium: 140 mmol/L (ref 135–145)
TCO2: 25 mmol/L (ref 22–32)
pCO2 arterial: 42.3 mmHg (ref 32.0–48.0)
pH, Arterial: 7.355 (ref 7.350–7.450)
pO2, Arterial: 129 mmHg — ABNORMAL HIGH (ref 83.0–108.0)

## 2021-03-07 LAB — GLUCOSE, CAPILLARY
Glucose-Capillary: 85 mg/dL (ref 70–99)
Glucose-Capillary: 84 mg/dL (ref 70–99)
Glucose-Capillary: 89 mg/dL (ref 70–99)
Glucose-Capillary: 109 mg/dL — ABNORMAL HIGH (ref 70–99)
Glucose-Capillary: 107 mg/dL — ABNORMAL HIGH (ref 70–99)
Glucose-Capillary: 107 mg/dL — ABNORMAL HIGH (ref 70–99)

## 2021-03-07 LAB — CSF CELL COUNT WITH DIFFERENTIAL
RBC Count, CSF: 23 /mm3 — ABNORMAL HIGH
RBC Count, CSF: 27 /mm3 — ABNORMAL HIGH
Tube #: 1
Tube #: 4
WBC, CSF: 1 /mm3 (ref 0–5)
WBC, CSF: 1 /mm3 (ref 0–5)

## 2021-03-07 LAB — CBC WITH DIFFERENTIAL/PLATELET
Abs Immature Granulocytes: 0.04 10*3/uL (ref 0.00–0.07)
Basophils Absolute: 0 10*3/uL (ref 0.0–0.1)
Basophils Relative: 0 %
Eosinophils Absolute: 0 10*3/uL (ref 0.0–0.5)
Eosinophils Relative: 0 %
HCT: 37.2 % (ref 36.0–46.0)
Hemoglobin: 12 g/dL (ref 12.0–15.0)
Immature Granulocytes: 0 %
Lymphocytes Relative: 17 %
Lymphs Abs: 2.4 10*3/uL (ref 0.7–4.0)
MCH: 31.8 pg (ref 26.0–34.0)
MCHC: 32.3 g/dL (ref 30.0–36.0)
MCV: 98.7 fL (ref 80.0–100.0)
Monocytes Absolute: 0.9 10*3/uL (ref 0.1–1.0)
Monocytes Relative: 6 %
Neutro Abs: 10.6 10*3/uL — ABNORMAL HIGH (ref 1.7–7.7)
Neutrophils Relative %: 77 %
Platelets: 206 10*3/uL (ref 150–400)
RBC: 3.77 MIL/uL — ABNORMAL LOW (ref 3.87–5.11)
RDW: 13.8 % (ref 11.5–15.5)
WBC: 14 10*3/uL — ABNORMAL HIGH (ref 4.0–10.5)
nRBC: 0 % (ref 0.0–0.2)

## 2021-03-07 LAB — CBC
HCT: 34 % — ABNORMAL LOW (ref 36.0–46.0)
Hemoglobin: 11.3 g/dL — ABNORMAL LOW (ref 12.0–15.0)
MCH: 32.3 pg (ref 26.0–34.0)
MCHC: 33.2 g/dL (ref 30.0–36.0)
MCV: 97.1 fL (ref 80.0–100.0)
Platelets: 259 10*3/uL (ref 150–400)
RBC: 3.5 MIL/uL — ABNORMAL LOW (ref 3.87–5.11)
RDW: 13.8 % (ref 11.5–15.5)
WBC: 16.7 10*3/uL — ABNORMAL HIGH (ref 4.0–10.5)
nRBC: 0 % (ref 0.0–0.2)

## 2021-03-07 LAB — COMPREHENSIVE METABOLIC PANEL
ALT: 24 U/L (ref 0–44)
AST: 35 U/L (ref 15–41)
Albumin: 3.3 g/dL — ABNORMAL LOW (ref 3.5–5.0)
Alkaline Phosphatase: 57 U/L (ref 38–126)
Anion gap: 7 (ref 5–15)
BUN: 12 mg/dL (ref 8–23)
CO2: 21 mmol/L — ABNORMAL LOW (ref 22–32)
Calcium: 8.4 mg/dL — ABNORMAL LOW (ref 8.9–10.3)
Chloride: 108 mmol/L (ref 98–111)
Creatinine, Ser: 0.67 mg/dL (ref 0.44–1.00)
GFR, Estimated: >60 mL/min (ref >60)
Glucose, Bld: 97 mg/dL (ref 70–99)
Potassium: 4.3 mmol/L (ref 3.5–5.1)
Sodium: 136 mmol/L (ref 135–145)
Total Bilirubin: 0.9 mg/dL (ref 0.3–1.2)
Total Protein: 6.4 g/dL — ABNORMAL LOW (ref 6.5–8.1)

## 2021-03-07 LAB — PROTEIN AND GLUCOSE, CSF
Glucose, CSF: 65 mg/dL (ref 40–70)
Total  Protein, CSF: 58 mg/dL — ABNORMAL HIGH (ref 15–45)

## 2021-03-07 LAB — HIV ANTIBODY (ROUTINE TESTING W REFLEX): HIV Screen 4th Generation wRfx: NONREACTIVE

## 2021-03-07 LAB — PHOSPHORUS: Phosphorus: 2.1 mg/dL — ABNORMAL LOW (ref 2.5–4.6)

## 2021-03-07 LAB — MRSA NEXT GEN BY PCR, NASAL: MRSA by PCR Next Gen: NOT DETECTED

## 2021-03-07 LAB — MAGNESIUM: Magnesium: 2.4 mg/dL (ref 1.7–2.4)

## 2021-03-07 LAB — TRIGLYCERIDES: Triglycerides: 151 mg/dL — ABNORMAL HIGH (ref <150)

## 2021-03-07 LAB — SARS CORONAVIRUS 2 (TAT 6-24 HRS): SARS Coronavirus 2: NEGATIVE

## 2021-03-07 IMAGING — MR MR HEAD WO/W CM
18 of 20 series · 42 of 48 positions shown · IV contrast (gadavist)
Comparison: None.

CLINICAL DATA: Seizure

EXAM:
MRI HEAD WITHOUT AND WITH CONTRAST
TECHNIQUE: Multiplanar, multiecho pulse sequences of the brain and surrounding
structures were obtained without and with intravenous contrast.
CONTRAST:  8mL GADAVIST GADOBUTROL 1 MMOL/ML IV SOLN

[Series 5: DWI · axial · 3.0mm · 0.88mm/px · z∈[-104,+52]mm · 5 of 106 slices shown (1 of 4)]
[im 1/106]
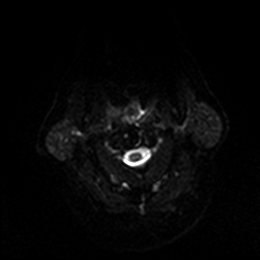
[im 27/106]
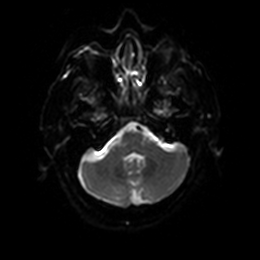
[im 53/106]
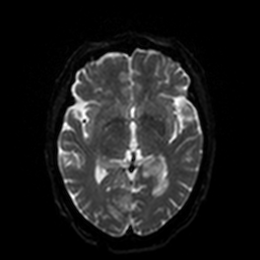
[im 79/106]
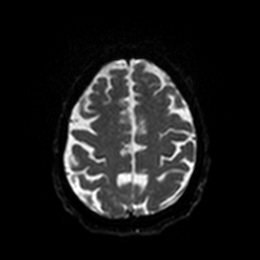
[im 106/106]
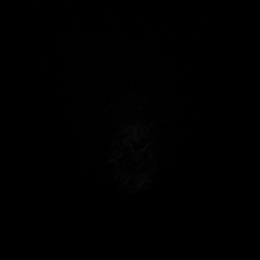

[Series 6: DWI · axial · 3.0mm · 0.88mm/px · z∈[-104,+52]mm · 3 of 53 slices shown (2 of 4)]
[im 1/53]
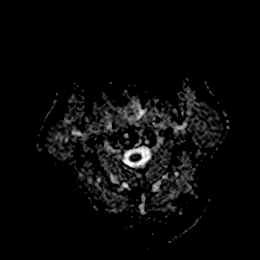
[im 27/53]
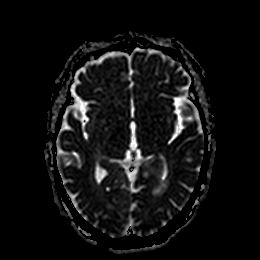
[im 53/53]
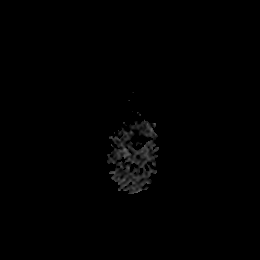

[Series 7: DWI · coronal · 4.0mm · 0.88mm/px · 4 of 70 slices shown (3 of 4)]
[im 1/70]
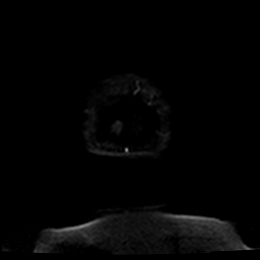
[im 24/70]
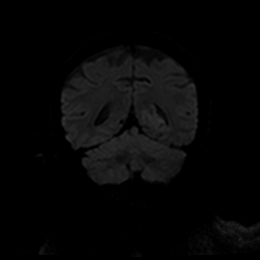
[im 47/70]
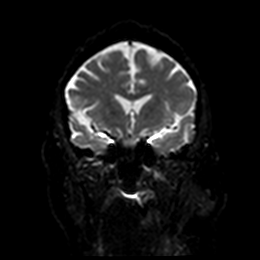
[im 70/70]
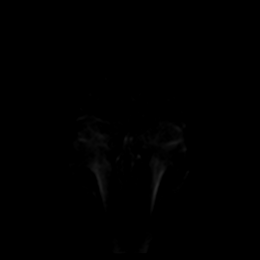

[Series 8: DWI · coronal · 4.0mm · 0.88mm/px · 2 of 35 slices shown (4 of 4)]
[im 1/35]
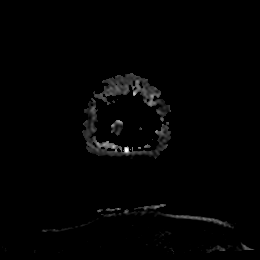
[im 35/35]
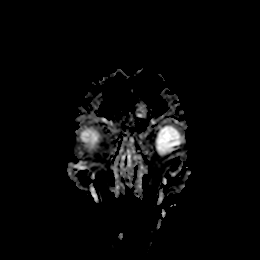

[Series 9: T1 · sagittal · 5.0mm · 0.75mm/px · 1 of 25 slices shown]
[im 1/25]
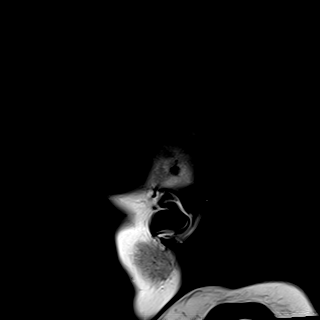

[Series 10: T2 · axial · 5.0mm · 0.72mm/px · 1 of 26 slices shown (1 of 2)]
[im 1/26]
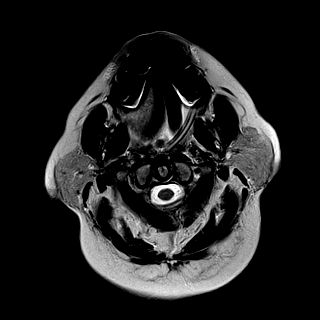

[Series 11: FLAIR · axial · 5.0mm · 0.45mm/px · 1 of 26 slices shown (1 of 2)]
[im 1/26]
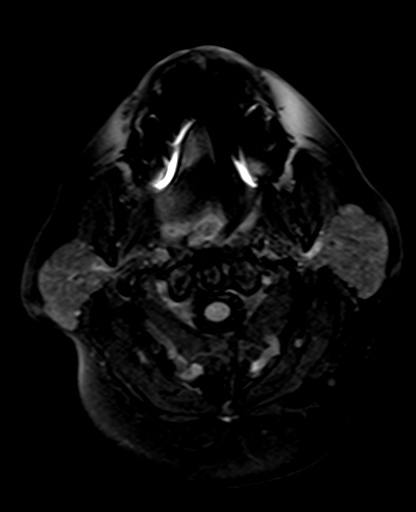

[Series 12: mag_images · axial · 3.0mm · 0.90mm/px · z∈[-109,+55]mm · 2 of 56 slices shown]
[im 1/56]
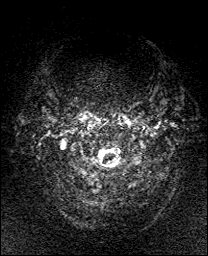
[im 56/56]
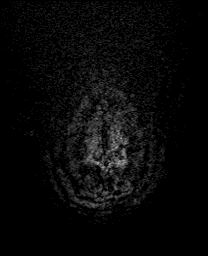

[Series 13: pha_images · axial · 3.0mm · 0.90mm/px · z∈[-109,+55]mm · 2 of 56 slices shown]
[im 1/56]
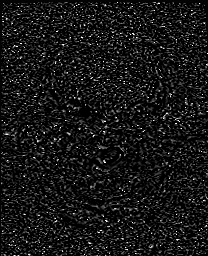
[im 56/56]
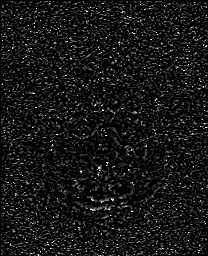

[Series 14: swi_images · axial · 3.0mm · 0.90mm/px · z∈[-109,+55]mm · 2 of 56 slices shown]
[im 1/56]
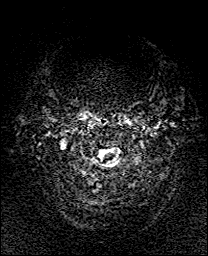
[im 56/56]
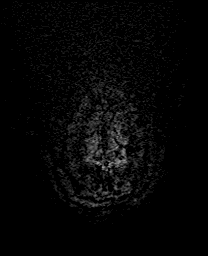

[Series 15: mip_images(sw) · axial · 24.0mm · 0.90mm/px · z∈[-98,+45]mm · 2 of 49 slices shown]
[im 1/49]
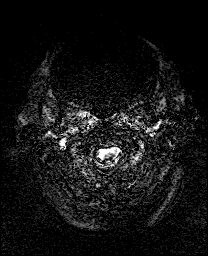
[im 49/49]
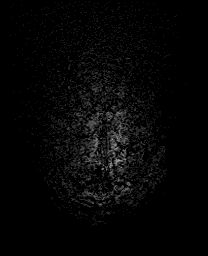

[Series 17: t1_mprage_tra_p2_iso · axial · 1.0mm · 0.98mm/px · z∈[-122,+53]mm · 7 of 176 slices shown]
[im 1/176]
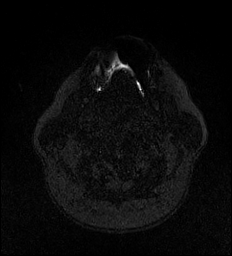
[im 30/176]
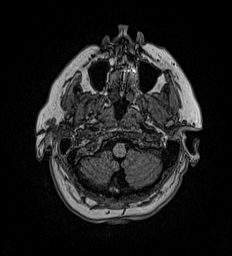
[im 59/176]
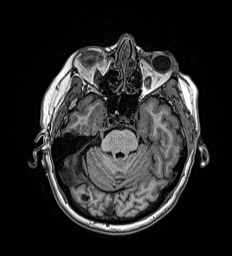
[im 88/176]
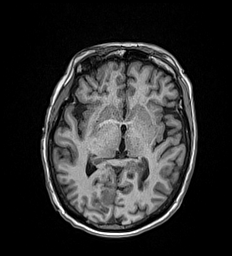
[im 117/176]
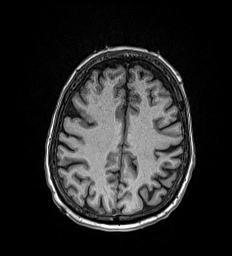
[im 146/176]
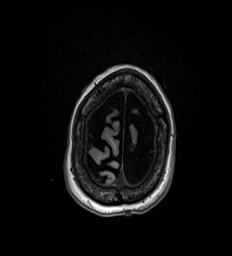
[im 176/176]
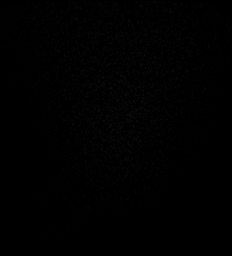

[Series 18: t1_mprage_tra_p2_iso_mpr_coronal · coronal · 1.0mm · 0.45mm/px · 5 of 172 slices shown]
[im 1/172]
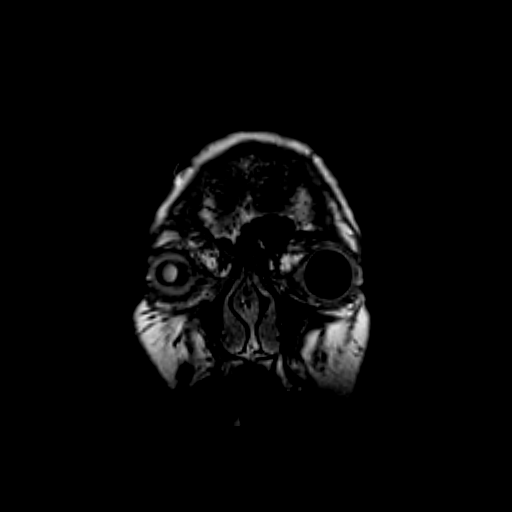
[im 29/172]
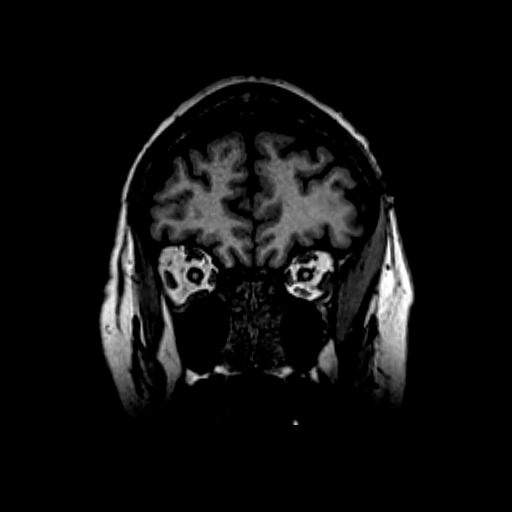
[im 58/172]
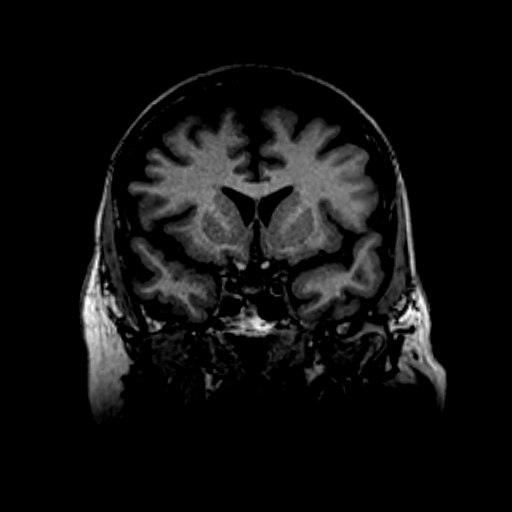
[im 86/172]
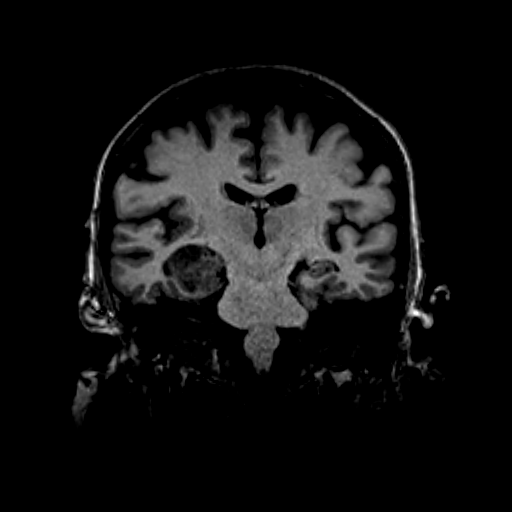
[im 115/172]
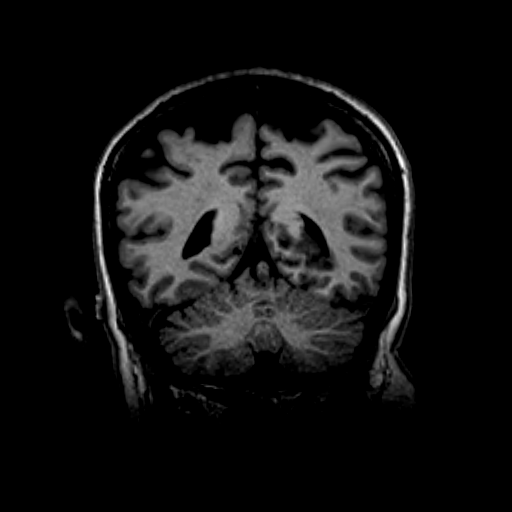

[Series 19: T2 · coronal · 3.0mm · 0.27mm/px · 1 of 32 slices shown (2 of 2)]
[im 1/32]
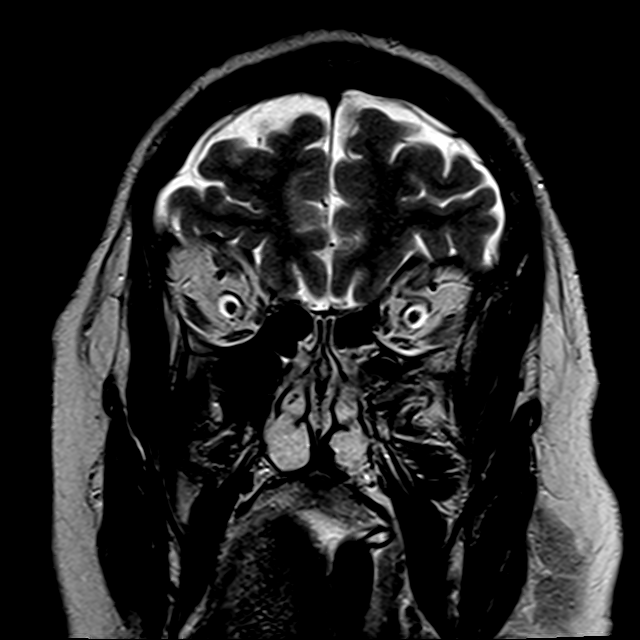

[Series 20: FLAIR · coronal · 3.0mm · 0.56mm/px · 1 of 31 slices shown (2 of 2)]
[im 1/31]
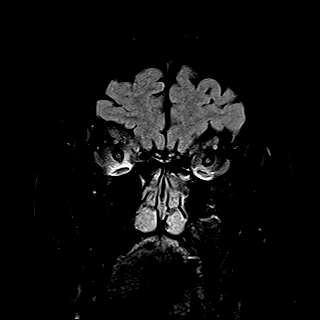

[Series 21: T2 post-contrast · coronal · 5.0mm · 0.72mm/px · 1 of 28 slices shown]
[im 1/28]
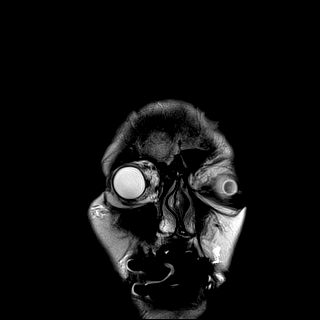

[Series 23: T1 post-contrast · coronal · 5.0mm · 0.34mm/px · 1 of 31 slices shown (1 of 2)]
[im 1/31]
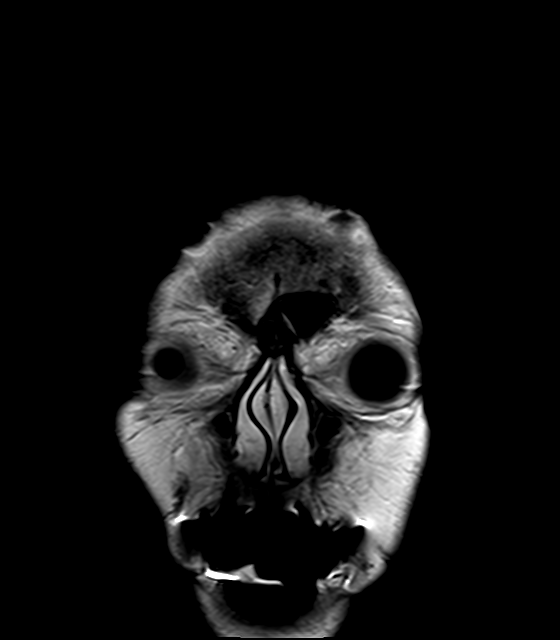

[Series 24: T1 post-contrast · sagittal · 5.0mm · 0.72mm/px · 1 of 25 slices shown (2 of 2)]
[im 1/25]
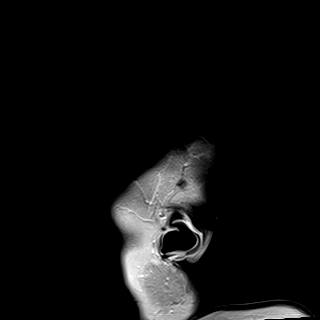

[42 of 48 positions shown; findings below may reference images not displayed]

FINDINGS: Brain: There are numerous contrast-enhancing lesions bilaterally
within the temporal and occipital lobes. The lesions are worst
within both medial temporal lobes. The most confluent area of
contrast enhancement measures 3.8 x 1.4 cm. Many of the lesions are
ring-enhancing. The ring-enhancing lesions do not demonstrate
central diffusion restriction. There is petechial hemorrhage
associated with the right occipital lesion. There is mild vasogenic
edema at the lesion sites without herniation or other significant
mass effect.

Vascular: Major flow voids are preserved.

Skull and upper cervical spine: Normal calvarium and skull base.
Visualized upper cervical spine and soft tissues are normal.

Sinuses/Orbits:No paranasal sinus fluid levels or advanced mucosal
thickening. No mastoid or middle ear effusion. Normal orbits.
IMPRESSION: 1. Numerous contrast-enhancing lesions bilaterally within the
temporal and occipital lobes. Primary considerations include severe
cerebritis versus a neoplastic process, such as multifocal glioma or
metastatic disease. However, the distribution of lesions is atypical
for metastatic disease. CSF sampling may be helpful.

## 2021-03-07 IMAGING — DX DG CHEST 1V PORT
1 series · 1 of 1 positions shown · non-contrast
Comparison: Chest x-ray [DATE].

CLINICAL DATA: Respiratory failure.

EXAM:
PORTABLE CHEST 1 VIEW

[chest ap]
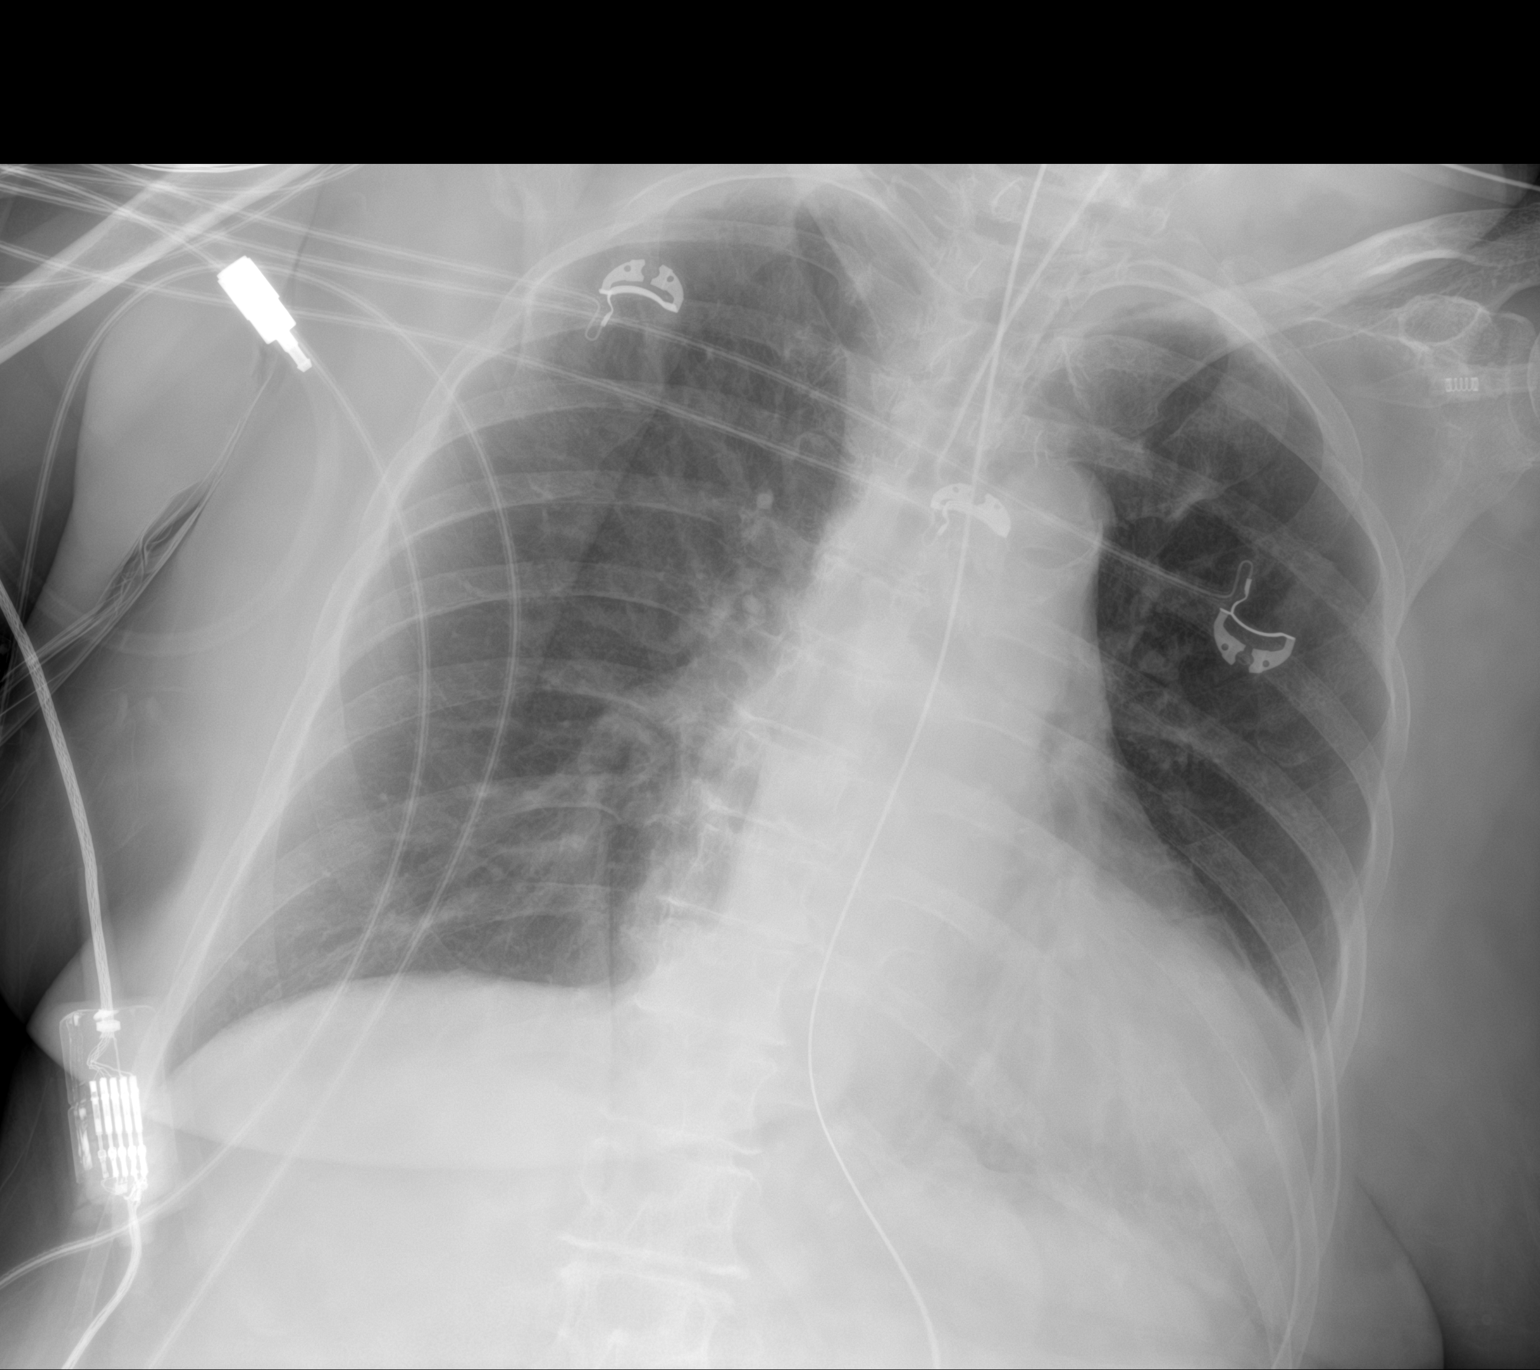

[1 of 1 positions shown; findings below may reference images not displayed]

FINDINGS: Patient rotated to the left. Endotracheal tube NG tube in stable
position. Stable cardiomegaly. Mild left base
atelectasis/infiltrate. Small left pleural effusion. No
pneumothorax.
IMPRESSION: 1. Endotracheal tube and NG tube in stable position. 2. Stable
cardiomegaly. 3. Mild left base atelectasis/infiltrate. Small left
pleural effusion.

## 2021-03-07 MED ORDER — GADOBUTROL 1 MMOL/ML IV SOLN
8.0000 mL | Freq: Once | INTRAVENOUS | Status: AC | PRN
  Administered 2021-03-07: 8 mL via INTRAVENOUS

## 2021-03-07 MED ORDER — ONDANSETRON HCL 4 MG/2ML IJ SOLN
4.0000 mg | Freq: Four times a day (QID) | INTRAMUSCULAR | Status: DC | PRN
  Administered 2021-03-07: 4 mg via INTRAVENOUS

## 2021-03-07 MED ORDER — ORAL CARE MOUTH RINSE
15.0000 mL | Freq: Two times a day (BID) | OROMUCOSAL | Status: DC
  Administered 2021-03-07 – 2021-03-17 (×20): 15 mL via OROMUCOSAL

## 2021-03-07 MED ORDER — ONDANSETRON HCL 4 MG/2ML IJ SOLN
INTRAMUSCULAR | Status: AC
  Filled 2021-03-07: qty 2

## 2021-03-07 MED ORDER — POTASSIUM CHLORIDE 10 MEQ/100ML IV SOLN
10.0000 meq | INTRAVENOUS | Status: AC
  Administered 2021-03-07 (×4): 10 meq via INTRAVENOUS
  Filled 2021-03-07 (×4): qty 100

## 2021-03-07 MED ORDER — HEPARIN SODIUM (PORCINE) 5000 UNIT/ML IJ SOLN
5000.0000 [IU] | Freq: Three times a day (TID) | INTRAMUSCULAR | Status: DC
  Administered 2021-03-07 – 2021-03-12 (×15): 5000 [IU] via SUBCUTANEOUS
  Filled 2021-03-07 (×14): qty 1

## 2021-03-07 MED ORDER — LACTATED RINGERS IV SOLN: INTRAVENOUS | Status: DC

## 2021-03-07 MED ORDER — POTASSIUM CHLORIDE 10 MEQ/100ML IV SOLN: 10.0000 meq | INTRAVENOUS | Status: DC

## 2021-03-07 MED ORDER — POTASSIUM CHLORIDE 20 MEQ PO PACK: 40.0000 meq | PACK | Freq: Once | ORAL | Status: DC

## 2021-03-07 MED ORDER — PANTOPRAZOLE SODIUM 40 MG PO PACK
40.0000 mg | PACK | Freq: Every day | ORAL | Status: DC
  Administered 2021-03-07: 40 mg
  Filled 2021-03-07: qty 20

## 2021-03-07 NOTE — Procedures (Addendum)
Patient Name: Andrea [name]  MRN: [id number]  Epilepsy Attending: Lora Havens  Referring Physician/Provider: Dr Roland Rack Duration: 03/06/2021 2146 to 03/07/2021 1412   Patient history: 75 year old female with new onset seizures.  EEG to evaluate for seizures.   Level of alertness:  lethargic   AEDs during EEG study: Keppra, Propofol   Technical aspects: This EEG study was done with scalp electrodes positioned according to the 10-20 International system of electrode placement. Electrical activity was acquired at a sampling rate of 500Hz  and reviewed with a high frequency filter of 70Hz  and a low frequency filter of 1Hz . EEG data were recorded continuously and digitally stored.   Description: EEG initially showed continuous generalized and lateralized right hemisphere 3 to 5 Hz theta -delta slowing admixed with overriding 15 to 18 Hz beta activity. After propofol was discontinued, EEG gradually improved and showed posterior dominant rhythm of 8-9 Hz activity of moderate voltage (25-35 uV) seen predominantly in posterior head regions, symmetric and reactive to eye opening and eye closing. Sleep was characterized by vertex waves, sleep spindles (12 to 14 Hz), maximal frontocentral region. EEG continued to show right hemispheric 3-5hz  theta-delta slowing. Hyperventilation and photic stimulation were not performed.      ABNORMALITY - Continuous slow, generalized and lateralized right hemisphere - Excessive beta, generalized   IMPRESSION: This study is suggestive of cortical dysfunction in right hemisphere likely secondary to underlying structural abnormality as well as moderate diffuse encephalopathy, nonspecific etiology but could be secondary to sedation. After propofol was discontinued, encephalopathy improved. No seizures or epileptiform discharges were seen throughout the recording.   Andrea [name]

## 2021-03-07 NOTE — Progress Notes (Signed)
Subjective: She is much more interactive, not moving the left side well  Exam: Vitals:   03/07/21 0930 03/07/21 0945  BP: 130/68 114/62  Pulse: 79 73  Resp: 18 18  Temp:    SpO2: 97% 98%   Gen: In bed, intubated Resp: non-labored breathing, no acute distress Abd: soft, nt  Neuro: MS: Awake, alert, able to follow commands readily CN: Pupils reactive bilaterally, EOMI, I suspect that she may have some left field cut, but difficult to be certain. Motor: She moves all extremities well and relatively symmetrically Sensory: She endorses sensation to light touch bilaterally  Pertinent Labs: Sodium 138 creatinine 0.77 Calcium 8.0   Impression: 75 year old female with new brain lesions of unclear etiology.  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  Her imaging is not consistent with herpes encephalitis and therefore we can discontinue acyclovir.  Recommendations: 1) agree with LP for cell count, protein, glucose, culture, cytology.  If there is a significant pleocytosis, could add additional studies. 2) discontinue acyclovir 3) continue Keppra 1 g twice daily 4) can discontinue EEG monitoring  Roland Rack, MD [facility] [phone]  If 7pm- 7am, please page neurology on call as listed in [place].

## 2021-03-07 NOTE — Progress Notes (Signed)
Transported pt to MRI and back to 4N Rm 17. No complications noted

## 2021-03-07 NOTE — Progress Notes (Signed)
EEG complete. No skin breakdown seen

## 2021-03-07 NOTE — Progress Notes (Signed)
[place] Progress Note Patient Name: Andrea [name] DOB: 1946/09/02 MRN: [id number]   Date of Service  03/07/2021  HPI/Events of Note  K+ 2.9.  eICU Interventions  K+ replaced per E-Link adult electrolyte replacement protocol.        Kerry Kass Darris Staiger 03/07/2021, 12:07 AM

## 2021-03-07 NOTE — Progress Notes (Signed)
[place] Progress Note Patient Name: Andrea [name] DOB: 16-Mar-1946 MRN: [id number]   Date of Service  03/07/2021  HPI/Events of Note  K+ 3.2  eICU Interventions  KCL 10 meq iv Q 1 hour x 4 hours ordered.        Kerry Kass Etienne Millward 03/07/2021, 4:57 AM

## 2021-03-07 NOTE — Procedures (Signed)
Lumbar Puncture Procedure Note  Andrea [name]  [id number]  [birth year]  Date:03/07/21  Time:10:22 AM   Provider Performing:Lyndi Holbein   Procedure: Lumbar Puncture (22583)  Indication(s) Rule out meningitis  Consent Risks of the procedure as well as the alternatives and risks of each were explained to the patient and/or caregiver.  Consent for the procedure was obtained and is signed in the bedside chart  Anesthesia Topical only with 1% lidocaine    Time Out Verified patient identification, verified procedure, site/side was marked, verified correct patient position, special equipment/implants available, medications/allergies/relevant history reviewed, required imaging and test results available.   Sterile Technique Maximal sterile technique including sterile barrier drape, hand hygiene, sterile gown, sterile gloves, mask, hair covering.    Procedure Description Using palpation, approximate location of L3-L4 space identified.   Lidocaine used to anesthetize skin and subcutaneous tissue overlying this area.  A 20g spinal needle was then used to access the subarachnoid space. Opening pressure: 15 cm H2O. Closing pressure:Not obtained. 15 cc clear CSF obtained.  Complications/Tolerance None; patient tolerated the procedure well.   EBL Minimal   Specimen(s) CSF

## 2021-03-07 NOTE — Procedures (Signed)
Patient Name: Destin Kittler  MRN: [id number]  Epilepsy Attending: Lora Havens  Referring Physician/Provider: Dr Roland Rack Date: 03/07/2021 Duration: 21.59 mins  Patient history: 75 year old female with new onset seizures.  EEG to evaluate for seizures.  Level of alertness:  lethargic   AEDs during EEG study: Keppra, Propofol  Technical aspects: This EEG study was done with scalp electrodes positioned according to the 10-20 International system of electrode placement. Electrical activity was acquired at a sampling rate of 500Hz  and reviewed with a high frequency filter of 70Hz  and a low frequency filter of 1Hz . EEG data were recorded continuously and digitally stored.   Description: EEG showed continuous generalized 3 to 5 Hz theta -delta slowing admixed with overriding 15 to 18 Hz beta activity. Hyperventilation and photic stimulation were not performed.     ABNORMALITY - Continuous slow, generalized - Excessive beta, generalized  IMPRESSION: This study is suggestive of moderate diffuse encephalopathy, nonspecific etiology but could be secondary to sedation. No seizures or epileptiform discharges were seen throughout the recording.   Nova Evett Barbra Sarks

## 2021-03-07 NOTE — Progress Notes (Signed)
EEG maintenance complete, No skin breakdown seen at electrode site Fp1 Ground and O1. Continue to monitor

## 2021-03-07 NOTE — Progress Notes (Signed)
eLink Physician-Brief Progress Note Patient Name: Andrea [name] DOB: 25-Jun-1946 MRN: [id number]   Date of Service  03/07/2021  HPI/Events of Note  Bedside RN called to inform me that patient received 80 meq of K+ via NG tube at 10 PM last night.  eICU Interventions  K+ replacement order  placed by me canceled and a BMP ordered for 2 AM.        Kerry Kass Karston Hyland 03/07/2021, 12:33 AM

## 2021-03-07 NOTE — H&P (Signed)
NAME:  Andrea [name], MRN:  [id number], DOB:  1946/02/26, LOS: 1 ADMISSION DATE:  03/06/2021, CONSULTATION DATE:  03/06/21 REFERRING MD:  Dina Rich CHIEF COMPLAINT:  AMS, Seizures   History of Present Illness:  Andrea [name] is a 75 y.o. female who [place] the middle [place].  Highway patrol was called and they witnessed 3 seizures on arrival.  She received 5mg  Versed en route to hospital by EMS. Upon arrival to [facility] ED, she was sonorous and minimally responsive and was therefore intubated.  CT head showed abnormal hypodensity within the anteromedial right temporal lobe/right hippocampus felt to be 2/2 acute right PCA territory infarct vs seizure related edema vs encephalitis vs underlying mass.  Brain MRI is pending. Neurology is on board and has started pt on Acyclovir. She was also started on Cardene gtt due to hypertension and tachycardia.  PCCM asked to admit to neuro ICU.  Per daughter, pt has a long standing smoking hx and quit 4 - 5 years ago (at least some 50 pack years or so history).  Recently she has been having mild memory issues but nothing too concerning.  No prior hx of seizures.  No hx malignancy.  She is UTD on mammograms.  Significant Hospital Events: Including procedures, antibiotic start and stop dates in addition to other pertinent events   7/5 > admit.  Interim History / Subjective:  MRI was done which showed multifocal bilateral ring-enhancing brain lesions Patient is awake, following commands on ventilator, moving all 4 extremities  Objective:  Blood pressure (!) 152/102, pulse 87, temperature 99 F (37.2 C), temperature source Axillary, resp. rate 16, height 5\' 6"  (1.676 m), weight 83.5 kg, SpO2 100 %.    Vent Mode: PRVC FiO2 (%):  [30 %-100 %] 30 % Set Rate:  [18 bmp] 18 bmp Vt Set:  [480 mL] 480 mL PEEP:  [5 cmH20] 5 cmH20 Plateau Pressure:  [14 cmH20-18 cmH20] 14 cmH20   Intake/Output Summary (Last 24 hours) at 03/07/2021 0914 Last data  filed at 03/07/2021 0800 Gross per 24 hour  Intake 1880.57 ml  Output --  Net 1880.57 ml   Filed Weights   03/06/21 1834 03/07/21 0403  Weight: 83.9 kg 83.5 kg    Examination: General: Acutely ill looking elderly Caucasian female, lying in the bed, orally intubated. Neuro: Awake, following commands, moving all 4 extremities HEENT: [place]/AT. Sclerae anicteric. ETT In place. Cardiovascular: Regular rate and rhythm, no murmur appreciated Lungs: Respirations even and unlabored.  CTA bilaterally, No W/R/R.  Abdomen: BS x 4, soft, NT/ND.  Musculoskeletal: No gross deformities, no edema.  Skin: Intact, warm, no rashes.  Labs/imaging personally reviewed:  CT head 7/5 > abnormal hypodensity within the anteromedial right temporal lobe/right hippocampus felt to be 2/2 acute right PCA territory infarct vs seizure related edema vs encephalitis vs underlying mass.   MRI brain 7/5 > 1. Numerous contrast-enhancing lesions bilaterally within the temporal and occipital lobes. Primary considerations include severe cerebritis versus a neoplastic process, such as multifocal glioma or metastatic disease. However, the distribution of lesions is atypical for metastatic disease EEG 7/5 > showing diffuse encephalopathy, no active seizures   Resolved Hospital Problem list:    Assessment & Plan:   Acute respiratory insufficiency - due to inability to protect the airway in the setting of seizures. Continue lung protective ventilation Post LP we will try to keep her on spontaneous breathing trial and see if she can tolerate and extubated Aggressive Bronchial hygiene.  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 Blood pressure is improved Closely monitor  Hypokalemia. Continue supplement electrolyte and monitor   Best practice (evaluated daily):  Diet/type: NPO DVT prophylaxis: prophylactic heparin  GI prophylaxis: PPI Lines: N/A Foley:  N/A Code Status:  full code Last date of multidisciplinary goals of care discussion: Patient's husband and her daughter were updated at bedside on 7/6  Labs   CBC: Recent Labs  Lab 03/06/21 1802 03/06/21 1806 03/06/21 1851 03/07/21 0328 03/07/21 0435  WBC 20.2*  --   --  16.7*  --   NEUTROABS 12.5*  --   --   --   --   HGB 13.9 15.3* 13.3 11.3* 10.5*  HCT 44.4 45.0 39.0 34.0* 31.0*  MCV 102.5*  --   --  97.1  --   PLT 380  --   --  259  --     Basic Metabolic Panel: Recent Labs  Lab 03/06/21 1802 03/06/21 1806 03/06/21 1851 03/06/21 2237 03/07/21 0328 03/07/21 0435  NA 137 137 135 135 138 140  K 2.9* 3.0* 2.8* 2.9* 3.3* 3.2*  CL 99 103  --  103 109  --   CO2 9*  --   --  22 22  --   GLUCOSE 219* 213*  --  156* 81  --   BUN 19 26*  --  16 15  --   CREATININE 1.22* 0.90  --  0.99 0.77  --   CALCIUM 9.4  --   --  8.5* 8.0*  --   MG  --   --   --  2.2 2.4  --   PHOS  --   --   --   --  2.1*  --    GFR: Estimated Creatinine Clearance: 66.2 mL/min (by C-G formula based on SCr of 0.77 mg/dL). Recent Labs  Lab 03/06/21 1802 03/07/21 0328  WBC 20.2* 16.7*    Liver Function Tests: Recent Labs  Lab 03/06/21 1802  AST 37  ALT 22  ALKPHOS 59  BILITOT 0.7  PROT 7.3  ALBUMIN 3.9   No results for input(s): LIPASE, AMYLASE in the last 168 hours. No results for input(s): AMMONIA in the last 168 hours.  ABG    Component Value Date/Time   PHART 7.355 03/07/2021 0435   PCO2ART 42.3 03/07/2021 0435   PO2ART 129 (H) 03/07/2021 0435   HCO3 23.6 03/07/2021 0435   TCO2 25 03/07/2021 0435   ACIDBASEDEF 2.0 03/07/2021 0435   O2SAT 99.0 03/07/2021 0435      Coagulation Profile: Recent Labs  Lab 03/06/21 1802  INR 1.1    Cardiac Enzymes: No results for input(s): CKTOTAL, CKMB, CKMBINDEX, TROPONINI in the last 168 hours.  HbA1C: Hgb A1c MFr Bld  Date/Time Value Ref Range Status  03/06/2021 10:36 PM 6.0 (H) 4.8 - 5.6 % Final    Comment:    (NOTE) Pre diabetes:          5.7%-6.4%  Diabetes:              >6.4%  Glycemic control for   <7.0% adults with diabetes     CBG: Recent Labs  Lab 03/06/21  1921 03/06/21 2316 03/07/21 0331 03/07/21 0742  GLUCAP 217* 107* 85 84    Total critical care time: 42 minutes  Performed by: [place] care time was exclusive of separately billable procedures and treating other patients.   Critical care was necessary to treat or prevent imminent or life-threatening deterioration.   Critical care was time spent personally by me on the following activities: development of treatment plan with patient and/or surrogate as well as nursing, discussions with consultants, evaluation of patient's response to treatment, examination of patient, obtaining history from patient or surrogate, ordering and performing treatments and interventions, ordering and review of laboratory studies, ordering and review of radiographic studies, pulse oximetry and re-evaluation of patient's condition.   Jacky Kindle MD [facility] See Amion for pager If no response to pager, please call [phone] until 7pm After 7pm, Please call E-link [phone]

## 2021-03-07 NOTE — Procedures (Signed)
Extubation Procedure Note  Patient Details:   Name: Andrea [name] DOB: 04-Dec-1945 MRN: [id number]   Airway Documentation:    Vent end date: 03/07/21 Vent end time: 1239   Evaluation  O2 sats: stable throughout Complications: No apparent complications Patient did tolerate procedure well. Bilateral Breath Sounds: Clear, Diminished   Yes Placed on 4L/min [place] Incentive spirometer  Revonda Standard 03/07/2021, 12:39 PM

## 2021-03-08 ENCOUNTER — Inpatient Hospital Stay (HOSPITAL_COMMUNITY): Payer: Medicare Other

## 2021-03-08 DIAGNOSIS — R569 Unspecified convulsions: Secondary | ICD-10-CM | POA: Diagnosis not present

## 2021-03-08 LAB — GLUCOSE, CAPILLARY
Glucose-Capillary: 107 mg/dL — ABNORMAL HIGH (ref 70–99)
Glucose-Capillary: 104 mg/dL — ABNORMAL HIGH (ref 70–99)
Glucose-Capillary: 107 mg/dL — ABNORMAL HIGH (ref 70–99)
Glucose-Capillary: 168 mg/dL — ABNORMAL HIGH (ref 70–99)

## 2021-03-08 LAB — URINE CULTURE: Culture: NO GROWTH

## 2021-03-08 LAB — CYTOLOGY - NON PAP

## 2021-03-08 IMAGING — US US ABDOMEN COMPLETE
1 series · 13 of 25 positions shown · non-contrast
Comparison: No prior.

CLINICAL DATA: Nausea vomiting.

EXAM:
ABDOMEN ULTRASOUND COMPLETE

[Series 1: us abdomen complete · 13 of 122 slices shown]
[im 1/122]
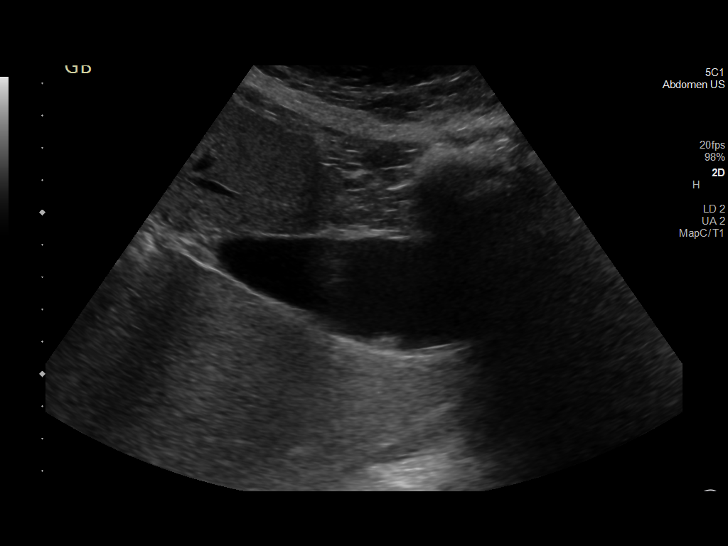
[im 11/122]
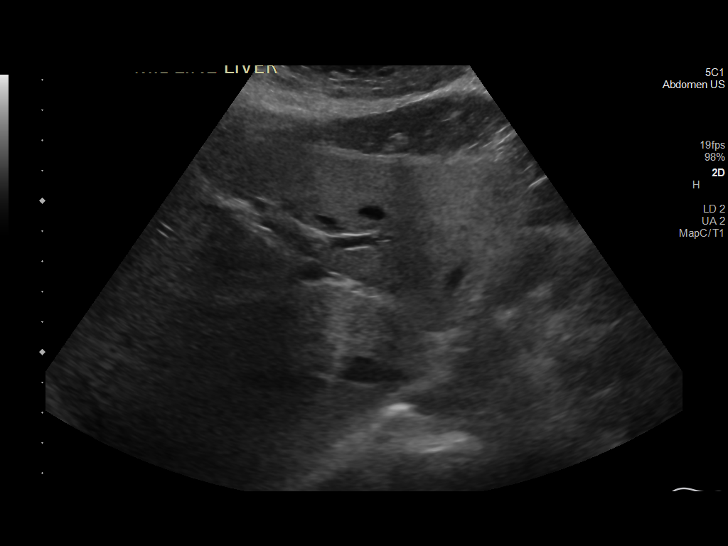
[im 21/122]
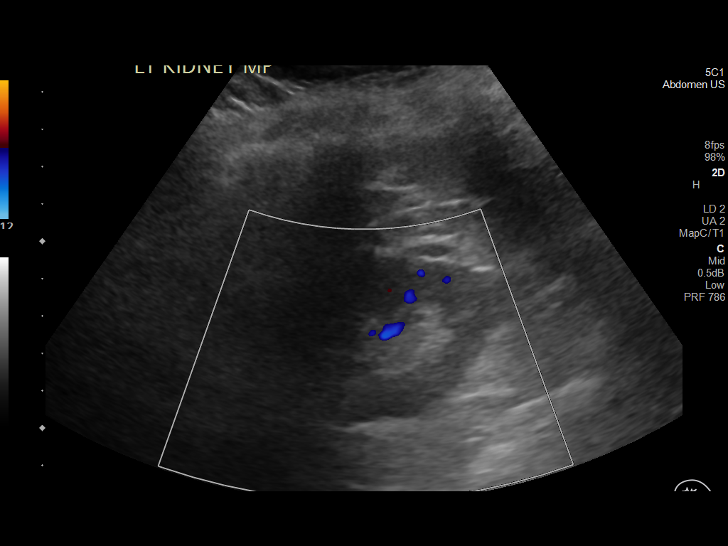
[im 31/122]
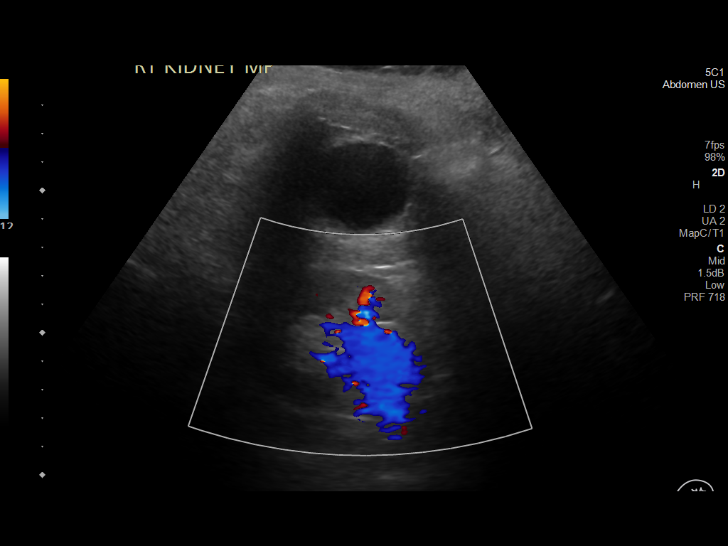
[im 41/122]
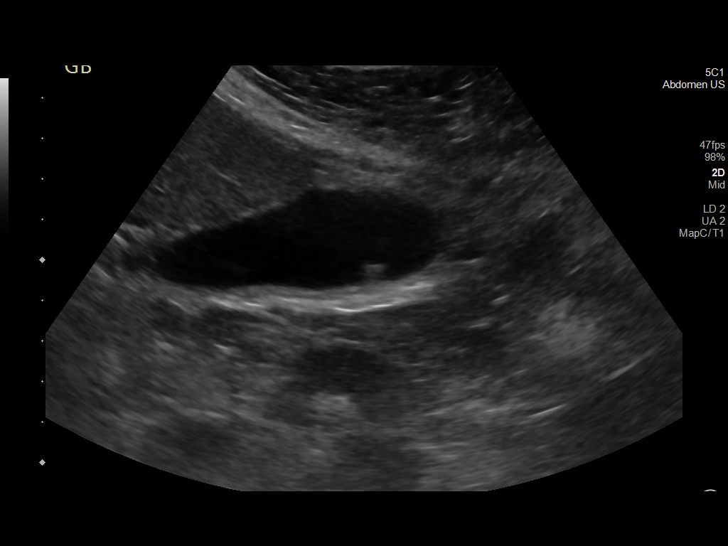
[im 51/122]
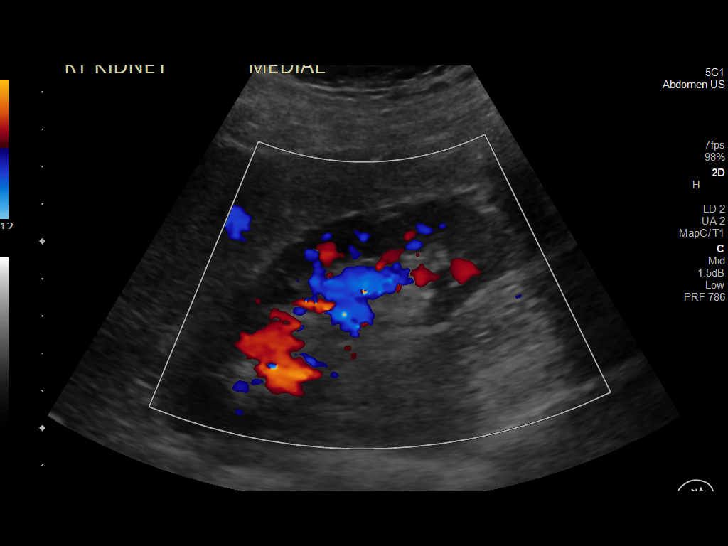
[im 61/122]
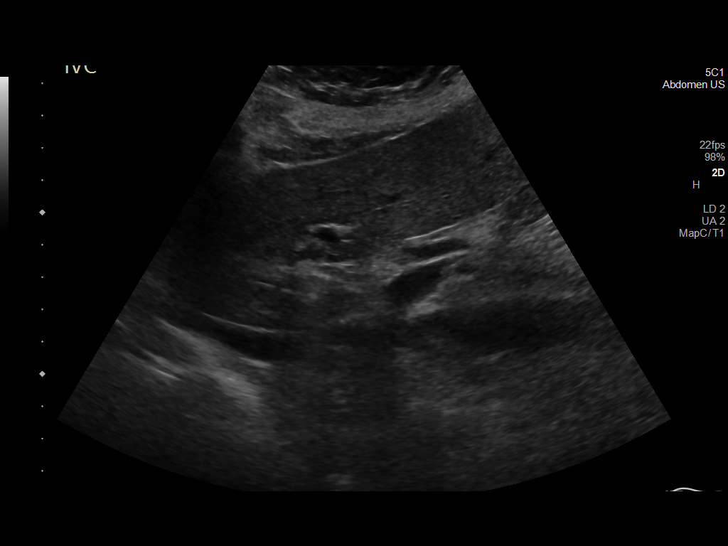
[im 71/122]
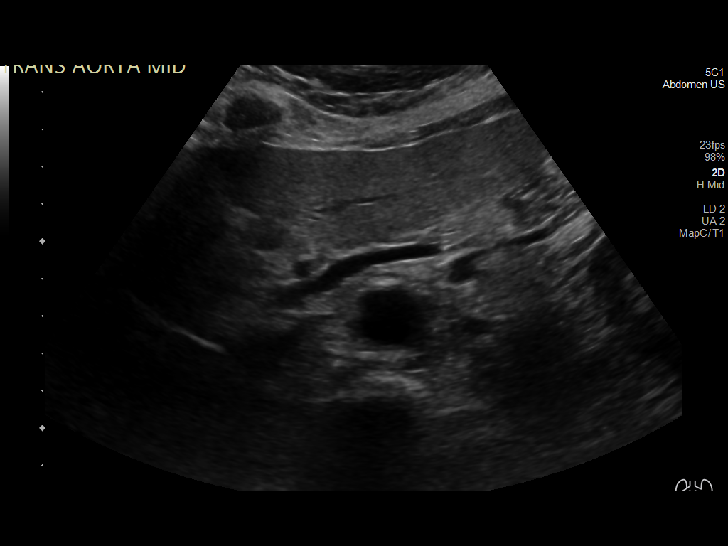
[im 81/122]
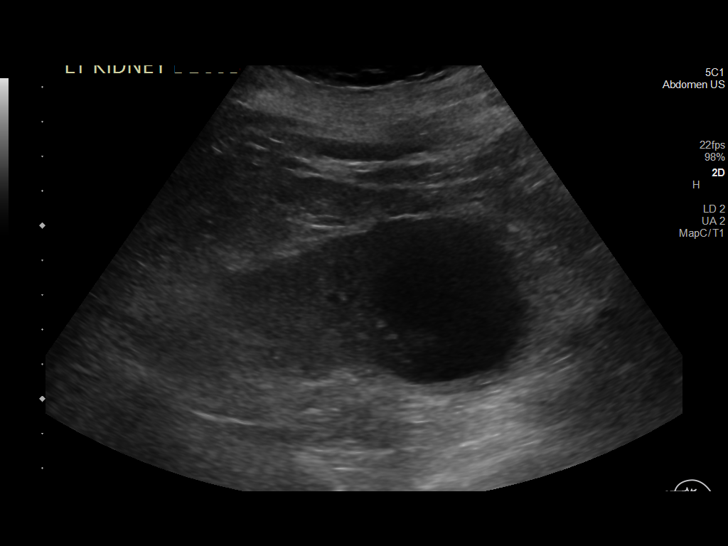
[im 91/122]
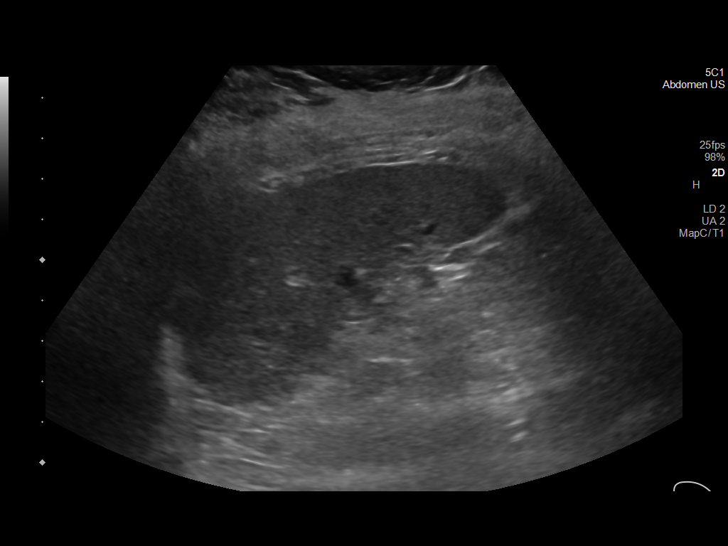
[im 101/122]
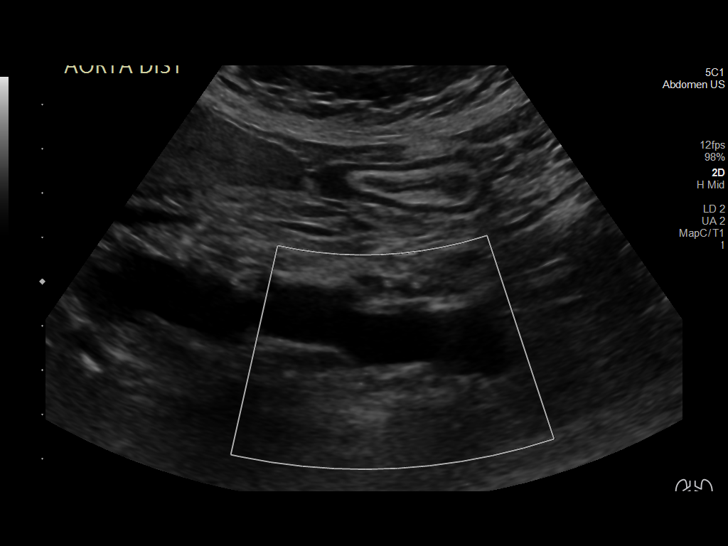
[im 111/122]
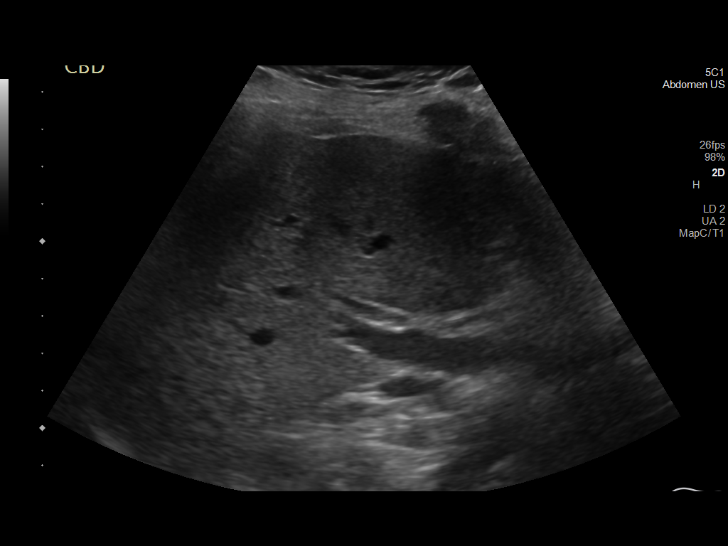
[im 122/122]
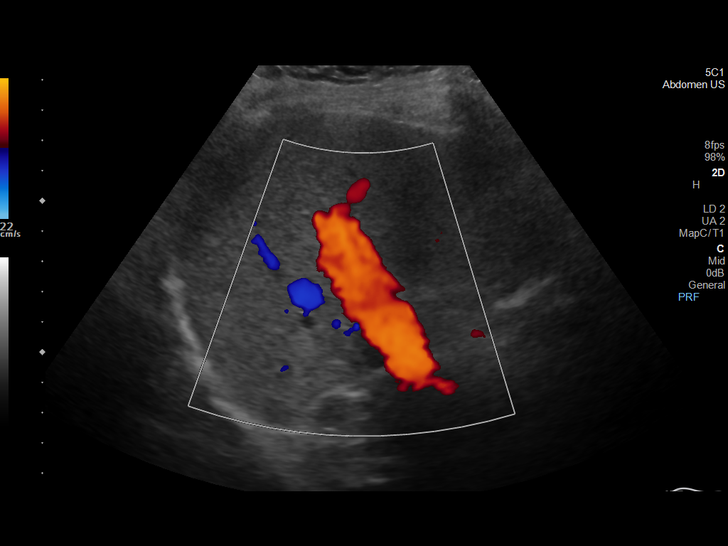

[13 of 25 positions shown; findings below may reference images not displayed]

FINDINGS: Gallbladder: Limited evaluation due to patient's body habitus. 6 mm
mobile nonshadowing echo density consistent with nonshadowing stone
or tumefactive sludge. Gallbladder wall thickness normal. Negative
Murphy sign.

Common bile duct: Diameter: 3 mm

Liver: Increased hepatic echogenicity consistent with fatty
infiltration and or hepatocellular disease. Slightly nodular contour
noted about the left hepatic lobe. Possibility of cirrhosis should
be considered. No focal hepatic abnormality identified. Portal vein
is patent on color Doppler imaging with normal direction of blood
flow towards the liver.

IVC: No abnormality visualized.

Pancreas: Visualized portion unremarkable.

Spleen: Size and appearance within normal limits.

Right Kidney: Length: 10.8 cm. Echogenicity within normal limits. No
mass or hydronephrosis visualized.

Left Kidney: Length: 11.3 cm. Echogenicity within normal limits.
cm simple cyst. No hydronephrosis visualized.

Abdominal aorta: No aneurysm visualized.

Other findings: Right pleural effusion incidentally noted.
IMPRESSION: 1. Limited evaluation due to patient's body habitus. 6 mm mobile
nonshadowing echo density consistent with nonshadowing gallstone or
tumefactive sludge noted within the gallbladder. No evidence of
cholecystitis. No biliary distention.

2. Increased hepatic echogenicity consistent with fatty infiltration
or hepatocellular disease. Slightly nodular hepatic contour
suggesting the possibility of cirrhosis. No focal hepatic
abnormality identified.

3.  4.5 cm simple left renal cyst.

4.  Right pleural effusion incidentally noted.

## 2021-03-08 MED ORDER — PANTOPRAZOLE SODIUM 40 MG PO PACK
40.0000 mg | PACK | Freq: Every day | ORAL | Status: DC
  Administered 2021-03-09 – 2021-03-13 (×5): 40 mg via ORAL
  Filled 2021-03-08 (×5): qty 20

## 2021-03-08 MED ORDER — DOCUSATE SODIUM 50 MG/5ML PO LIQD
100.0000 mg | Freq: Two times a day (BID) | ORAL | Status: DC
  Administered 2021-03-09 – 2021-03-15 (×6): 100 mg via ORAL
  Filled 2021-03-08 (×10): qty 10

## 2021-03-08 MED ORDER — LISINOPRIL 2.5 MG PO TABS
5.0000 mg | ORAL_TABLET | Freq: Every day | ORAL | Status: DC
  Administered 2021-03-08 – 2021-03-17 (×10): 5 mg via ORAL
  Filled 2021-03-08 (×2): qty 1
  Filled 2021-03-08 (×2): qty 2
  Filled 2021-03-08 (×2): qty 1
  Filled 2021-03-08: qty 2
  Filled 2021-03-08 (×3): qty 1

## 2021-03-08 MED ORDER — LEVETIRACETAM 500 MG PO TABS
1000.0000 mg | ORAL_TABLET | Freq: Two times a day (BID) | ORAL | Status: DC
  Administered 2021-03-08 – 2021-03-17 (×18): 1000 mg via ORAL
  Filled 2021-03-08 (×18): qty 2

## 2021-03-08 NOTE — Progress Notes (Signed)
NAME:  Andrea [name], MRN:  [id number], DOB:  1946-04-28, LOS: 2 ADMISSION DATE:  03/06/2021, CONSULTATION DATE:  03/06/21 REFERRING MD:  Dina Rich CHIEF COMPLAINT:  AMS, Seizures   Brief Narative:  Neela Zecca is a 75 y.o. female who [place]/5.  Upon arrival to [facility] ED, she was sonorous and minimally responsive and was therefore intubated.  CT head showed abnormal hypodensity within the anteromedial right temporal lobe/right hippocampus felt to be 2/2 acute right PCA territory infarct vs seizure related edema vs encephalitis vs underlying mass.    Significant Hospital Events:  7/5 > admit with acute onset seizures 7/6 > EEG negative for seizures, LP preformed at bedside. MRI was done which showed multifocal bilateral ring-enhancing brain lesions 7/7 >   Interim History / Subjective:  No acute issues overnight  States she feels well with no acute complaints   Reports she recently underwent evaluation for complaints of upper chest paraesthesia for which she had imagining (unable to find any additional imaging in system)  She also reports approximately 10 pound unintentional weight loss due to lack of appetite and N/V    Reports she has never had a colonoscopy   Objective:  Blood pressure (!) 152/77, pulse 72, temperature 98.3 F (36.8 C), temperature source Oral, resp. rate 18, height 5\' 6"  (1.676 m), weight 83.5 kg, SpO2 100 %.    Vent Mode: PSV;CPAP FiO2 (%):  [30 %] 30 % Set Rate:  [18 bmp] 18 bmp Vt Set:  [480 mL] 480 mL PEEP:  [5 cmH20] 5 cmH20 Pressure Support:  [5 cmH20] 5 cmH20 Plateau Pressure:  [14 cmH20] 14 cmH20   Intake/Output Summary (Last 24 hours) at 03/08/2021 0726 Last data filed at 03/08/2021 0600 Gross per 24 hour  Intake 3372.2 ml  Output 3050 ml  Net 322.2 ml    Filed Weights   03/06/21 1834 03/07/21 0403  Weight: 83.9 kg 83.5 kg    Examination: General: Acute on chronically ill appearing elderly female lying in bed  in NAD HEENT: [place]/AT, MM pink/moist, PERRL,  Neuro: Alert and oriented x3, non-focal, full strength in all extremities CV: s1s2 regular rate and rhythm, no murmur, rubs, or gallops,  PULM:  Clear to ascultation, no increased work of breathing, no added breath sounds GI: soft, bowel sounds active in all 4 quadrants, non-tender, non-distended Extremities: warm/dry, no edema  Skin: no rashes or lesions    Labs/imaging personally reviewed:  CT head 7/5 > abnormal hypodensity within the anteromedial right temporal lobe/right hippocampus felt to be 2/2 acute right PCA territory infarct vs seizure related edema vs encephalitis vs underlying mass.   MRI brain 7/5 > Numerous contrast-enhancing lesions bilaterally within the temporal and occipital lobes. Primary considerations include severe cerebritis versus a neoplastic process, such as multifocal glioma or metastatic disease. 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 -MRI brain with multifocal bilateral ring-enhancing brain lesions Bilateral, multiple ring-enhancing brain lesions P: Management per neurology  Consult NSGY to consider malignancy workup, may need brain biopsy  Maintain neuro protective measures Nutrition and bowel regiment  Seizure precautions  Continue Keppra  Aspirations precautions  Continue Empiric Acyclovir  LP with slightly elevated total protein but no other abnormalities Consider CT chest  ABD and Pelvis imaging to assess for malignancy  Obtain CEA  Hypertensive urgency -No known history of HTN and no mannitance medication at baseline  P: Required Nicardipine on admit this has been weaned off  Start oral Lisinopril  Continuous telemetry   Stable  for transfer out of ICU is NSGY plans on intervention  Best practice (evaluated daily):  Diet/type: Regular consistency (see orders) DVT prophylaxis: prophylactic heparin  GI prophylaxis: PPI Lines: N/A Foley:  N/A Code Status:  full code Last date of multidisciplinary goals of care discussion: Patient's husband and her daughter were updated at bedside on 7/6  Signature:  Merica Prell D. Kenton Kingfisher, NP-C  Pulmonary & Critical Care Personal contact information can be found on Amion  03/08/2021, 7:41 AM

## 2021-03-08 NOTE — Consult Note (Signed)
Reason for Consult: Evaluation for brain biopsy  Referring Physician: Jacky Kindle, MD   HPI: Andrea [name] is a 75 y.o. female who [place] the [place]. [place] patrol was notified and they witnessed 3 seizures on their arrival. She received 5mg  Versed en route to hospital by EMS. 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  She was not protecting her airway and therefore was intubated prior to going to CT.  CT/CTA/CTP did not reveal any evidence of a lesion that would be amenable to intervention. CT head showed abnormal hypodensity within the anteromedial right temporal lobe/right hippocampus felt to be 2/2 acute right PCA territory infarct vs seizure related edema vs encephalitis vs underlying mass.  CCM admitted patient to the ICU and neurology was consulted. Neurology started pt on empiric Acyclovir. She was also started on Cardene gtt due to hypertension and tachycardia. Neurology latter discontinued the Acyclovir due to her imaging not being consistent with herpes encephalitis. EEG revealed cortical dysfunction in right hemisphere likely secondary to underlying structural abnormality as well as moderate diffuse encephalopathy, nonspecific etiology but could be secondary to sedation. After propofol was discontinued, encephalopathy improved. No seizures or epileptiform discharges were seen throughout the recording. The patient continued to improve and she was extubated on 7/6.     Per daughter, the patient was a heavy smoker and and quit 4 - 5 years ago (at least a 50 pack year history).  Recently she has been having mild memory issues but nothing too concerning.  No prior hx of seizures.  No hx malignancy.    No past medical history on file.   No family history on file.  Social History:  has no history on file for tobacco use, alcohol use, and drug use.  Allergies:  Allergies  Allergen Reactions   Penicillins  Rash    Medications: I have reviewed the patient's current medications.  Results for orders placed or performed during the hospital encounter of 03/06/21 (from the past 48 hour(s))  Protime-INR     Status: None   Collection Time: 03/06/21  6:02 PM  Result Value Ref Range   Prothrombin Time 14.1 11.4 - 15.2 seconds   INR 1.1 0.8 - 1.2    Comment: (NOTE) INR goal varies based on device and disease states. Performed at [facility], [place] [address], [place], [place] [address]   APTT     Status: Abnormal   Collection Time: 03/06/21  6:02 PM  Result Value Ref Range   aPTT 22 (L) 24 - 36 seconds    Comment: Performed at [facility] [address], [place], [place] [address]  CBC     Status: Abnormal   Collection Time: 03/06/21  6:02 PM  Result Value Ref Range   WBC 20.2 (H) 4.0 - 10.5 K/uL   RBC 4.33 3.87 - 5.11 MIL/uL   Hemoglobin 13.9 12.0 - 15.0 g/dL   HCT 44.4 36.0 - 46.0 %   MCV 102.5 (H) 80.0 - 100.0 fL   MCH 32.1 26.0 - 34.0 pg   MCHC 31.3 30.0 - 36.0 g/dL   RDW 13.2 11.5 - 15.5 %   Platelets 380 150 - 400 K/uL   nRBC 0.0 0.0 - 0.2 %    Comment: Performed at [facility], [place] [address] [place],  [address]  Differential     Status: Abnormal  Collection Time: 03/06/21  6:02 PM  Result Value Ref Range   Neutrophils Relative % 60 %   Neutro Abs 12.5 (H) 1.7 - 7.7 K/uL   Lymphocytes Relative 32 %   Lymphs Abs 6.4 (H) 0.7 - 4.0 K/uL   Monocytes Relative 6 %   Monocytes Absolute 1.1 (H) 0.1 - 1.0 K/uL   Eosinophils Relative 0 %   Eosinophils Absolute 0.1 0.0 - 0.5 K/uL   Basophils Relative 1 %   Basophils Absolute 0.1 0.0 - 0.1 K/uL   Immature Granulocytes 1 %   Abs Immature Granulocytes 0.12 (H) 0.00 - 0.07 K/uL    Comment: Performed at [facility] [address], [place], [place] [address]  Comprehensive metabolic panel     Status: Abnormal   Collection Time: 03/06/21  6:02 PM  Result Value Ref Range   Sodium 137 135 - 145 mmol/L    Potassium 2.9 (L) 3.5 - 5.1 mmol/L   Chloride 99 98 - 111 mmol/L   CO2 9 (L) 22 - 32 mmol/L   Glucose, Bld 219 (H) 70 - 99 mg/dL    Comment: Glucose reference range applies only to samples taken after fasting for at least 8 hours.   BUN 19 8 - 23 mg/dL   Creatinine, Ser 1.22 (H) 0.44 - 1.00 mg/dL   Calcium 9.4 8.9 - 10.3 mg/dL   Total Protein 7.3 6.5 - 8.1 g/dL   Albumin 3.9 3.5 - 5.0 g/dL   AST 37 15 - 41 U/L   ALT 22 0 - 44 U/L    Comment: RESULTS CONFIRMED BY MANUAL DILUTION CORRECTED RESULTS CALLED TOArdeth Perfect RN [phone] K FORSYTH CORRECTED ON 07/05 AT 1936: PREVIOUSLY REPORTED AS 11 RESULTS CONFIRMED BY MANUAL DILUTION    Alkaline Phosphatase 59 38 - 126 U/L   Total Bilirubin 0.7 0.3 - 1.2 mg/dL   GFR, Estimated 46 (L) >60 mL/min    Comment: (NOTE) Calculated using the CKD-EPI Creatinine Equation (2021)    Anion gap 29 (H) 5 - 15    Comment: Performed at [facility], [place] [address] [place],  [address] CORRECTED ON 07/05 AT 1936: PREVIOUSLY REPORTED AS 29 REPEATED TO VERIFY   I-stat chem 8, ED     Status: Abnormal   Collection Time: 03/06/21  6:06 PM  Result Value Ref Range   Sodium 137 135 - 145 mmol/L   Potassium 3.0 (L) 3.5 - 5.1 mmol/L   Chloride 103 98 - 111 mmol/L   BUN 26 (H) 8 - 23 mg/dL   Creatinine, Ser 0.90 0.44 - 1.00 mg/dL   Glucose, Bld 213 (H) 70 - 99 mg/dL    Comment: Glucose reference range applies only to samples taken after fasting for at least 8 hours.   Calcium, Ion 1.10 (L) 1.15 - 1.40 mmol/L   TCO2 14 (L) 22 - 32 mmol/L   Hemoglobin 15.3 (H) 12.0 - 15.0 g/dL   HCT 45.0 36.0 - 46.0 %  I-Stat arterial blood gas, ED     Status: Abnormal   Collection Time: 03/06/21  6:51 PM  Result Value Ref Range   pH, Arterial 7.268 (L) 7.350 - 7.450   pCO2 arterial 46.9 32.0 - 48.0 mmHg   pO2, Arterial 477 (H) 83.0 - 108.0 mmHg   Bicarbonate 21.7 20.0 - 28.0 mmol/L   TCO2 23 22 - 32 mmol/L   O2 Saturation 100.0 %   Acid-base deficit  6.0 (H) 0.0 - 2.0  mmol/L   Sodium 135 135 - 145 mmol/L   Potassium 2.8 (L) 3.5 - 5.1 mmol/L   Calcium, Ion 1.19 1.15 - 1.40 mmol/L   HCT 39.0 36.0 - 46.0 %   Hemoglobin 13.3 12.0 - 15.0 g/dL   Patient temperature 96.4 F    Collection site Radial    Drawn by RT    Sample type ARTERIAL   SARS CORONAVIRUS 2 (TAT 6-24 HRS) Nasopharyngeal Nasopharyngeal Swab     Status: None   Collection Time: 03/06/21  6:54 PM   Specimen: Nasopharyngeal Swab  Result Value Ref Range   SARS Coronavirus 2 NEGATIVE NEGATIVE    Comment: (NOTE) SARS-CoV-2 target nucleic acids are NOT DETECTED.  The SARS-CoV-2 RNA is generally detectable in upper and lower respiratory specimens during the acute phase of infection. Negative results do not preclude SARS-CoV-2 infection, do not rule out co-infections with other pathogens, and should not be used as the sole basis for treatment or other patient management decisions. Negative results must be combined with clinical observations, patient history, and epidemiological information. The expected result is Negative.  Fact Sheet for Patients: SugarRoll.be  Fact Sheet for Healthcare Providers: https://www.woods-mathews.com/  This test is not yet approved or cleared by the [place] FDA and  has been authorized for detection and/or diagnosis of SARS-CoV-2 by FDA under an Emergency Use Authorization (EUA). This EUA will remain  in effect (meaning this test can be used) for the duration of the COVID-19 declaration under Se ction 564(b)(1) of the Act, 21 U.S.C. section 360bbb-3(b)(1), unless the authorization is terminated or revoked sooner.  Performed at [facility], [place] [address], [place], [place] [address]   CBG monitoring, ED     Status: Abnormal   Collection Time: 03/06/21  7:21 PM  Result Value Ref Range   Glucose-Capillary 217 (H) 70 - 99 mg/dL    Comment: Glucose reference range applies only to samples  taken after fasting for at least 8 hours.  Urine culture     Status: None   Collection Time: 03/06/21  7:32 PM   Specimen: Urine, Random  Result Value Ref Range   Specimen Description URINE, RANDOM    Special Requests NONE    Culture      NO GROWTH Performed at [facility], [place] [address] [place], [place] [address]    Report Status 03/08/2021 FINAL   MRSA Next Gen by PCR, Nasal     Status: None   Collection Time: 03/06/21  9:50 PM   Specimen: Nasal Mucosa; Nasal Swab  Result Value Ref Range   MRSA by PCR Next Gen NOT DETECTED NOT DETECTED    Comment: (NOTE) The GeneXpert MRSA Assay (FDA approved for NASAL specimens only), is one component of a comprehensive MRSA colonization surveillance program. It is not intended to diagnose MRSA infection nor to guide or monitor treatment for MRSA infections. Test performance is not FDA approved in patients less than 17 years old. Performed at [facility], [place] [address] [place],  [address]   Culture, blood (routine x 2)     Status: None (Preliminary result)   Collection Time: 03/06/21 10:35 PM   Specimen: BLOOD LEFT WRIST  Result Value Ref Range   Specimen Description BLOOD LEFT WRIST    Special Requests      BOTTLES DRAWN AEROBIC ONLY Blood Culture results may not be optimal due to an inadequate volume of blood received in culture bottles   Culture  NO GROWTH 1 DAY Performed at [facility], [place] [address], [place], [place] [address]    Report Status PENDING   Hemoglobin A1c     Status: Abnormal   Collection Time: 03/06/21 10:36 PM  Result Value Ref Range   Hgb A1c MFr Bld 6.0 (H) 4.8 - 5.6 %    Comment: (NOTE) Pre diabetes:          5.7%-6.4%  Diabetes:              >6.4%  Glycemic control for   <7.0% adults with diabetes    Mean Plasma Glucose 125.5 mg/dL    Comment: Performed at [facility] [address], [place], [place] [address]  Basic metabolic panel     Status: Abnormal    Collection Time: 03/06/21 10:37 PM  Result Value Ref Range   Sodium 135 135 - 145 mmol/L   Potassium 2.9 (L) 3.5 - 5.1 mmol/L   Chloride 103 98 - 111 mmol/L   CO2 22 22 - 32 mmol/L   Glucose, Bld 156 (H) 70 - 99 mg/dL    Comment: Glucose reference range applies only to samples taken after fasting for at least 8 hours.   BUN 16 8 - 23 mg/dL   Creatinine, Ser 0.99 0.44 - 1.00 mg/dL   Calcium 8.5 (L) 8.9 - 10.3 mg/dL   GFR, Estimated 59 (L) >60 mL/min    Comment: (NOTE) Calculated using the CKD-EPI Creatinine Equation (2021)    Anion gap 10 5 - 15    Comment: Performed at [facility] [address] [place], [place] [address]  Magnesium     Status: None   Collection Time: 03/06/21 10:37 PM  Result Value Ref Range   Magnesium 2.2 1.7 - 2.4 mg/dL    Comment: Performed at [facility], [place] [address] [place], [place] [address]  Culture, blood (routine x 2)     Status: None (Preliminary result)   Collection Time: 03/06/21 10:40 PM   Specimen: BLOOD RIGHT WRIST  Result Value Ref Range   Specimen Description BLOOD RIGHT WRIST    Special Requests      BOTTLES DRAWN AEROBIC ONLY Blood Culture results may not be optimal due to an inadequate volume of blood received in culture bottles   Culture      NO GROWTH 1 DAY Performed at [facility], [place] [address], [place], [place] [address]    Report Status PENDING   Glucose, capillary     Status: Abnormal   Collection Time: 03/06/21 11:16 PM  Result Value Ref Range   Glucose-Capillary 107 (H) 70 - 99 mg/dL    Comment: Glucose reference range applies only to samples taken after fasting for at least 8 hours.  Triglycerides     Status: Abnormal   Collection Time: 03/07/21  3:28 AM  Result Value Ref Range   Triglycerides 151 (H) <150 mg/dL    Comment: Performed at [facility] [address], [place], [place] [address]  CBC     Status: Abnormal   Collection Time: 03/07/21  3:28 AM  Result Value Ref Range   WBC 16.7  (H) 4.0 - 10.5 K/uL   RBC 3.50 (L) 3.87 - 5.11 MIL/uL   Hemoglobin 11.3 (L) 12.0 - 15.0 g/dL   HCT 34.0 (L) 36.0 - 46.0 %   MCV 97.1 80.0 - 100.0 fL   MCH 32.3 26.0 - 34.0 pg  MCHC 33.2 30.0 - 36.0 g/dL   RDW 13.8 11.5 - 15.5 %   Platelets 259 150 - 400 K/uL   nRBC 0.0 0.0 - 0.2 %    Comment: Performed at [facility], [place] [address] [place], [place] [address]  Magnesium     Status: None   Collection Time: 03/07/21  3:28 AM  Result Value Ref Range   Magnesium 2.4 1.7 - 2.4 mg/dL    Comment: Performed at [facility] [address], [place], [place] [address]  Phosphorus     Status: Abnormal   Collection Time: 03/07/21  3:28 AM  Result Value Ref Range   Phosphorus 2.1 (L) 2.5 - 4.6 mg/dL    Comment: Performed at [facility] [address], [place], [place] [address]  Basic metabolic panel     Status: Abnormal   Collection Time: 03/07/21  3:28 AM  Result Value Ref Range   Sodium 138 135 - 145 mmol/L   Potassium 3.3 (L) 3.5 - 5.1 mmol/L   Chloride 109 98 - 111 mmol/L   CO2 22 22 - 32 mmol/L   Glucose, Bld 81 70 - 99 mg/dL    Comment: Glucose reference range applies only to samples taken after fasting for at least 8 hours.   BUN 15 8 - 23 mg/dL   Creatinine, Ser 0.77 0.44 - 1.00 mg/dL   Calcium 8.0 (L) 8.9 - 10.3 mg/dL   GFR, Estimated >60 >60 mL/min    Comment: (NOTE) Calculated using the CKD-EPI Creatinine Equation (2021)    Anion gap 7 5 - 15    Comment: Performed at [facility] [address], [place], [place] [address]  Glucose, capillary     Status: None   Collection Time: 03/07/21  3:31 AM  Result Value Ref Range   Glucose-Capillary 85 70 - 99 mg/dL    Comment: Glucose reference range applies only to samples taken after fasting for at least 8 hours.  I-STAT 7, (LYTES, BLD GAS, ICA, H+H)     Status: Abnormal   Collection Time: 03/07/21  4:35 AM  Result Value Ref Range   pH, Arterial 7.355 7.350 - 7.450   pCO2 arterial 42.3 32.0 - 48.0  mmHg   pO2, Arterial 129 (H) 83.0 - 108.0 mmHg   Bicarbonate 23.6 20.0 - 28.0 mmol/L   TCO2 25 22 - 32 mmol/L   O2 Saturation 99.0 %   Acid-base deficit 2.0 0.0 - 2.0 mmol/L   Sodium 140 135 - 145 mmol/L   Potassium 3.2 (L) 3.5 - 5.1 mmol/L   Calcium, Ion 1.21 1.15 - 1.40 mmol/L   HCT 31.0 (L) 36.0 - 46.0 %   Hemoglobin 10.5 (L) 12.0 - 15.0 g/dL   Patient temperature 37.1 C    Collection site Radial    Drawn by RT    Sample type ARTERIAL   Glucose, capillary     Status: None   Collection Time: 03/07/21  7:42 AM  Result Value Ref Range   Glucose-Capillary 84 70 - 99 mg/dL    Comment: Glucose reference range applies only to samples taken after fasting for at least 8 hours.   Comment 1 Notify RN    Comment 2 Document in Chart   CSF cell count with differential collection tube #: 1     Status: Abnormal   Collection Time: 03/07/21 10:13 AM  Result Value Ref Range   Tube # 1    Color,  CSF COLORLESS COLORLESS   Appearance, CSF CLEAR (A) CLEAR   Supernatant NOT INDICATED    RBC Count, CSF 23 (H) 0 /cu mm   WBC, CSF 1 0 - 5 /cu mm   Other Cells, CSF TOO FEW TO COUNT, SMEAR AVAILABLE FOR REVIEW     Comment:  RARE LYMPHS AND MONO/MACROPHAGES Performed at [facility] [address] [place], [place] [address]   CSF cell count with differential collection tube #: 4     Status: Abnormal   Collection Time: 03/07/21 10:13 AM  Result Value Ref Range   Tube # 4    Color, CSF COLORLESS COLORLESS   Appearance, CSF CLEAR (A) CLEAR   Supernatant NOT INDICATED    RBC Count, CSF 27 (H) 0 /cu mm   WBC, CSF 1 0 - 5 /cu mm   Other Cells, CSF TOO FEW TO COUNT, SMEAR AVAILABLE FOR REVIEW     Comment:  RARE NEUT, RARE LYMPH, RARE MONO/MACROPHAGES Performed at [facility] [address] [place], [place] [address]   Protein and glucose, CSF     Status: Abnormal   Collection Time: 03/07/21 10:13 AM  Result Value Ref Range   Glucose, CSF 65 40 - 70 mg/dL   Total  Protein, CSF 58  (H) 15 - 45 mg/dL    Comment: Performed at [facility] [address] [place], [place] [address]  Culture, blood (routine x 2)     Status: None (Preliminary result)   Collection Time: 03/07/21 10:18 AM   Specimen: BLOOD LEFT ARM  Result Value Ref Range   Specimen Description BLOOD LEFT ARM    Special Requests      BOTTLES DRAWN AEROBIC ONLY Blood Culture results may not be optimal due to an inadequate volume of blood received in culture bottles   Culture      NO GROWTH < 24 HOURS Performed at [facility], [place] [address], [place], [place] [address]    Report Status PENDING   CSF culture w Gram Stain     Status: None (Preliminary result)   Collection Time: 03/07/21 10:25 AM   Specimen: CSF; Cerebrospinal Fluid  Result Value Ref Range   Specimen Description CSF    Special Requests LUMBAR PUNCTURE    Gram Stain      WBC PRESENT, PREDOMINANTLY MONONUCLEAR NO ORGANISMS SEEN CYTOSPIN SMEAR Performed at [facility], [place] [address] [place], [place] [address]    Culture PENDING    Report Status PENDING   Culture, blood (routine x 2)     Status: None (Preliminary result)   Collection Time: 03/07/21 11:09 AM   Specimen: BLOOD LEFT WRIST  Result Value Ref Range   Specimen Description BLOOD LEFT WRIST    Special Requests      BOTTLES DRAWN AEROBIC ONLY Blood Culture results may not be optimal due to an inadequate volume of blood received in culture bottles   Culture      NO GROWTH < 24 HOURS Performed at [facility] [address] [place], [place] [address]    Report Status PENDING   Glucose, capillary     Status: None   Collection Time: 03/07/21 11:10 AM  Result Value Ref Range   Glucose-Capillary 89 70 - 99 mg/dL    Comment: Glucose reference range applies only to samples taken after fasting for at least 8 hours.   Comment 1 Notify RN  Comment 2 Document in Chart   Comprehensive metabolic panel     Status: Abnormal   Collection Time: 03/07/21  11:34 AM  Result Value Ref Range   Sodium 136 135 - 145 mmol/L   Potassium 4.3 3.5 - 5.1 mmol/L   Chloride 108 98 - 111 mmol/L   CO2 21 (L) 22 - 32 mmol/L   Glucose, Bld 97 70 - 99 mg/dL    Comment: Glucose reference range applies only to samples taken after fasting for at least 8 hours.   BUN 12 8 - 23 mg/dL   Creatinine, Ser 0.67 0.44 - 1.00 mg/dL   Calcium 8.4 (L) 8.9 - 10.3 mg/dL   Total Protein 6.4 (L) 6.5 - 8.1 g/dL   Albumin 3.3 (L) 3.5 - 5.0 g/dL   AST 35 15 - 41 U/L   ALT 24 0 - 44 U/L   Alkaline Phosphatase 57 38 - 126 U/L   Total Bilirubin 0.9 0.3 - 1.2 mg/dL   GFR, Estimated >60 >60 mL/min    Comment: (NOTE) Calculated using the CKD-EPI Creatinine Equation (2021)    Anion gap 7 5 - 15    Comment: Performed at [facility], [place] [address] [place], [place] [address]  CBC with Differential     Status: Abnormal   Collection Time: 03/07/21 11:34 AM  Result Value Ref Range   WBC 14.0 (H) 4.0 - 10.5 K/uL   RBC 3.77 (L) 3.87 - 5.11 MIL/uL   Hemoglobin 12.0 12.0 - 15.0 g/dL   HCT 37.2 36.0 - 46.0 %   MCV 98.7 80.0 - 100.0 fL   MCH 31.8 26.0 - 34.0 pg   MCHC 32.3 30.0 - 36.0 g/dL   RDW 13.8 11.5 - 15.5 %   Platelets 206 150 - 400 K/uL   nRBC 0.0 0.0 - 0.2 %   Neutrophils Relative % 77 %   Neutro Abs 10.6 (H) 1.7 - 7.7 K/uL   Lymphocytes Relative 17 %   Lymphs Abs 2.4 0.7 - 4.0 K/uL   Monocytes Relative 6 %   Monocytes Absolute 0.9 0.1 - 1.0 K/uL   Eosinophils Relative 0 %   Eosinophils Absolute 0.0 0.0 - 0.5 K/uL   Basophils Relative 0 %   Basophils Absolute 0.0 0.0 - 0.1 K/uL   Immature Granulocytes 0 %   Abs Immature Granulocytes 0.04 0.00 - 0.07 K/uL    Comment: Performed at [facility] [address] [place], [place] [address]  HIV Antibody (routine testing w rflx)     Status: None   Collection Time: 03/07/21 11:34 AM  Result Value Ref Range   HIV Screen 4th Generation wRfx Non Reactive Non Reactive    Comment: Performed at [facility], [place] [address] [place], [place] [address]  Glucose, capillary     Status: Abnormal   Collection Time: 03/07/21  3:09 PM  Result Value Ref Range   Glucose-Capillary 109 (H) 70 - 99 mg/dL    Comment: Glucose reference range applies only to samples taken after fasting for at least 8 hours.   Comment 1 Notify RN    Comment 2 Document in Chart   Glucose, capillary     Status: Abnormal   Collection Time: 03/07/21  7:29 PM  Result Value Ref Range   Glucose-Capillary 107 (H) 70 - 99 mg/dL    Comment: Glucose reference range applies only to samples taken after fasting for at least 8 hours.  Glucose, capillary  Status: Abnormal   Collection Time: 03/07/21 11:08 PM  Result Value Ref Range   Glucose-Capillary 107 (H) 70 - 99 mg/dL    Comment: Glucose reference range applies only to samples taken after fasting for at least 8 hours.  Glucose, capillary     Status: Abnormal   Collection Time: 03/08/21  3:18 AM  Result Value Ref Range   Glucose-Capillary 107 (H) 70 - 99 mg/dL    Comment: Glucose reference range applies only to samples taken after fasting for at least 8 hours.  Glucose, capillary     Status: Abnormal   Collection Time: 03/08/21  7:38 AM  Result Value Ref Range   Glucose-Capillary 104 (H) 70 - 99 mg/dL    Comment: Glucose reference range applies only to samples taken after fasting for at least 8 hours.    MR BRAIN W WO CONTRAST  Result Date: 03/07/2021 CLINICAL DATA:  Seizure EXAM: MRI HEAD WITHOUT AND WITH CONTRAST TECHNIQUE: Multiplanar, multiecho pulse sequences of the brain and surrounding structures were obtained without and with intravenous contrast. CONTRAST:  50mL GADAVIST GADOBUTROL 1 MMOL/ML IV SOLN COMPARISON:  None. FINDINGS: Brain: There are numerous contrast-enhancing lesions bilaterally within the temporal and occipital lobes. The lesions are worst within both medial temporal lobes. The most confluent area of contrast enhancement measures 3.8 x 1.4 cm. Many of  the lesions are ring-enhancing. The ring-enhancing lesions do not demonstrate central diffusion restriction. There is petechial hemorrhage associated with the right occipital lesion. There is mild vasogenic edema at the lesion sites without herniation or other significant mass effect. Vascular: Major flow voids are preserved. Skull and upper cervical spine: Normal calvarium and skull base. Visualized upper cervical spine and soft tissues are normal. Sinuses/Orbits:No paranasal sinus fluid levels or advanced mucosal thickening. No mastoid or middle ear effusion. Normal orbits. IMPRESSION: 1. Numerous contrast-enhancing lesions bilaterally within the temporal and occipital lobes. Primary considerations include severe cerebritis versus a neoplastic process, such as multifocal glioma or metastatic disease. However, the distribution of lesions is atypical for metastatic disease. CSF sampling may be helpful. Electronically Signed   By: Ulyses Jarred M.D.   On: 03/07/2021 03:31   DG Chest Port 1 View  Result Date: 03/07/2021 CLINICAL DATA:  Respiratory failure. EXAM: PORTABLE CHEST 1 VIEW COMPARISON:  Chest x-ray 03/06/2021. FINDINGS: Patient rotated to the left. Endotracheal tube NG tube in stable position. Stable cardiomegaly. Mild left base atelectasis/infiltrate. Small left pleural effusion. No pneumothorax. IMPRESSION: 1. Endotracheal tube and NG tube in stable position. 2. Stable cardiomegaly. 3. Mild left base atelectasis/infiltrate. Small left pleural effusion. Electronically Signed   By: Marcello Moores  Register   On: 03/07/2021 07:11   DG Chest Portable 1 View  Result Date: 03/06/2021 CLINICAL DATA:  Post intubation, code stroke EXAM: PORTABLE CHEST 1 VIEW COMPARISON:  Portable exam 1835 hours without priors for comparison FINDINGS: Tip of endotracheal tube projects 3.7 cm above carina. Nasogastric tube extends into stomach. Normal heart size, mediastinal contours, and pulmonary vascularity. Atherosclerotic  calcification aorta. Minimal bibasilar atelectasis. Lungs otherwise clear. No pulmonary infiltrate, pleural effusion, or pneumothorax. Bones demineralized. IMPRESSION: Minimal bibasilar atelectasis. Aortic Atherosclerosis (ICD10-I70.0). Electronically Signed   By: Lavonia Dana M.D.   On: 03/06/2021 18:47   EEG adult  Result Date: 03/07/2021 Lora Havens, MD     03/07/2021  8:52 AM Patient Name: Andrea [name] MRN: [id number] Epilepsy Attending: Lora Havens Referring Physician/Provider: Dr Roland Rack Date: 03/07/2021 Duration: 21.59 mins Patient history: 75 year old female with new  onset seizures.  EEG to evaluate for seizures. Level of alertness:  lethargic AEDs during EEG study: Keppra, Propofol Technical aspects: This EEG study was done with scalp electrodes positioned according to the 10-20 International system of electrode placement. Electrical activity was acquired at a sampling rate of 500Hz  and reviewed with a high frequency filter of 70Hz  and a low frequency filter of 1Hz . EEG data were recorded continuously and digitally stored. Description: EEG showed continuous generalized 3 to 5 Hz theta -delta slowing admixed with overriding 15 to 18 Hz beta activity. Hyperventilation and photic stimulation were not performed.   ABNORMALITY - Continuous slow, generalized - Excessive beta, generalized IMPRESSION: This study is suggestive of moderate diffuse encephalopathy, nonspecific etiology but could be secondary to sedation. No seizures or epileptiform discharges were seen throughout the recording. Priyanka Barbra Sarks   Overnight EEG with video  Result Date: 03/07/2021 Lora Havens, MD     03/07/2021  6:48 PM Patient Name: Andrea [name] MRN: [id number] Epilepsy Attending: Lora Havens Referring Physician/Provider: Dr Roland Rack Duration: 03/06/2021 2146 to 03/07/2021 1412  Patient history: 75 year old female with new onset seizures.  EEG to evaluate for seizures.  Level of alertness:   lethargic  AEDs during EEG study: Keppra, Propofol  Technical aspects: This EEG study was done with scalp electrodes positioned according to the 10-20 International system of electrode placement. Electrical activity was acquired at a sampling rate of 500Hz  and reviewed with a high frequency filter of 70Hz  and a low frequency filter of 1Hz . EEG data were recorded continuously and digitally stored.  Description: EEG initially showed continuous generalized and lateralized right hemisphere 3 to 5 Hz theta -delta slowing admixed with overriding 15 to 18 Hz beta activity. After propofol was discontinued, EEG gradually improved and showed posterior dominant rhythm of 8-9 Hz activity of moderate voltage (25-35 uV) seen predominantly in posterior head regions, symmetric and reactive to eye opening and eye closing. Sleep was characterized by vertex waves, sleep spindles (12 to 14 Hz), maximal frontocentral region. EEG continued to show right hemispheric 3-5hz  theta-delta slowing. Hyperventilation and photic stimulation were not performed.    ABNORMALITY - Continuous slow, generalized and lateralized right hemisphere - Excessive beta, generalized  IMPRESSION: This study is suggestive of cortical dysfunction in right hemisphere likely secondary to underlying structural abnormality as well as moderate diffuse encephalopathy, nonspecific etiology but could be secondary to sedation. After propofol was discontinued, encephalopathy improved. No seizures or epileptiform discharges were seen throughout the recording. Lora Havens    CT HEAD CODE STROKE WO CONTRAST  Result Date: 03/06/2021 CLINICAL DATA:  Code stroke. Neuro deficit, acute, stroke suspected. Additional history provided: Stroke-like symptoms. EXAM: CT HEAD WITHOUT CONTRAST TECHNIQUE: Contiguous axial images were obtained from the base of the skull through the vertex without intravenous contrast. COMPARISON:  No pertinent prior exams available for comparison.  FINDINGS: Brain: Mild generalized cerebral atrophy. Apparent abnormal hypodensity within the anteromedial right temporal lobe/right hippocampus (for instance as seen on series 5, image 33). This may reflect an acute right PCA territory infarct. However, alternative considerations such as seizure related edema, encephalitis or an underlying mass cannot be excluded. An additional small focus of cortical hypodensity is questioned within the lateral right occipital lobe (series 2, image 15). This may reflect a small right MCA/PCA watershed territory infarct. No evidence of acute intracranial hemorrhage. No extra-axial fluid collection. No evidence of an intracranial mass. No midline shift. Vascular: No hyperdense vessel. Skull: Normal. Negative for fracture or  focal lesion. Sinuses/Orbits: Visualized orbits show no acute finding. Trace bilateral ethmoid sinus mucosal thickening. ASPECTS (Warrior Stroke Program Early CT Score) - Ganglionic level infarction (caudate, lentiform nuclei, internal capsule, insula, M1-M3 cortex): Five - Supraganglionic infarction (M4-M6 cortex): 3 Total score (0-10 with 10 being normal): 9 These results were called by telephone at the time of interpretation on 03/06/2021 at 6:34 pm to provider Kathrynn Speed, who verbally acknowledged these results. IMPRESSION: Apparent abnormal hypodensity within the anteromedial right temporal lobe/right hippocampus. This may reflect an acute right PCA territory infarct. However, alternative considerations such as seizure related edema, encephalitis or an underlying mass cannot be excluded. A contrast-enhanced brain MRI is recommended for further evaluation. An additional small focus of cortical hypodensity is questioned within the lateral right occipital lobe, which may reflect an acute right MCA/PCA territory watershed infarct. ASPECTS is 8. Mild generalized cerebral atrophy. Electronically Signed   By: Kellie Simmering DO   On: 03/06/2021 18:36   CT ANGIO  HEAD NECK W WO CM W PERF (CODE STROKE)  Result Date: 03/06/2021 CLINICAL DATA:  Stroke, follow-up.  Assess intracranial arteries. EXAM: CT ANGIOGRAPHY HEAD AND NECK CT PERFUSION BRAIN TECHNIQUE: Multidetector CT imaging of the head and neck was performed using the standard protocol during bolus administration of intravenous contrast. Multiplanar CT image reconstructions and MIPs were obtained to evaluate the vascular anatomy. Carotid stenosis measurements (when applicable) are obtained utilizing NASCET criteria, using the distal internal carotid diameter as the denominator. Multiphase CT imaging of the brain was performed following IV bolus contrast injection. Subsequent parametric perfusion maps were calculated using RAPID software. CONTRAST:  143mL OMNIPAQUE IOHEXOL 350 MG/ML SOLN COMPARISON:  Noncontrast head CT performed immediately prior. FINDINGS: CTA NECK FINDINGS Aortic arch: Common origin of the innominate and left common carotid arteries. The visualized aortic arch is normal in caliber. Atherosclerotic plaque within the visualized aortic arch and proximal major branch vessels of the neck. No hemodynamically significant innominate or proximal subclavian artery stenosis. Right carotid system: CCA and ICA patent within the neck without hemodynamically significant stenosis (50% or greater). Mild soft and calcified plaque within the mid to distal CCA, carotid bifurcation and proximal ICA. Left carotid system: CCA and ICA patent within neck without hemodynamically significant stenosis (50% or greater). Mild soft and calcified plaque within the mid to distal CCA, carotid bifurcation and proximal ICA. Tortuosity of the distal cervical ICA. Vertebral arteries: Vertebral arteries codominant and patent within the neck without hemodynamically significant stenosis. Skeleton: Cervical spondylosis. No acute bony abnormality or aggressive osseous lesion. Other neck: Subcentimeter thyroid nodules not meeting consensus  criteria for ultrasound follow-up. No neck mass or cervical lymphadenopathy. Upper chest: No consolidation within the imaged lung apices. Centrilobular emphysema. Partially visualized support tubes. Review of the MIP images confirms the above findings CTA HEAD FINDINGS Anterior circulation: The intracranial internal carotid arteries are patent. Calcified plaque within both vessels with no more than mild stenosis. The M1 middle cerebral arteries are patent. No M2 proximal branch occlusion or high-grade proximal stenosis is identified. The anterior cerebral arteries are patent. 1-2 mm inferiorly projecting vascular true shin arising from the supraclinoid left ICA, which may reflect an infundibulum or aneurysm (series 9, image 123). Posterior circulation: The intracranial vertebral arteries are patent. The basilar artery is patent. The posterior cerebral arteries are patent. Fetal origin right posterior cerebral artery. The left posterior communicating artery is hypoplastic or absent. Venous sinuses: Within the limitations of contrast timing, no convincing thrombus. Anatomic variants: As described Review  of the MIP images confirms the above findings CT Brain Perfusion Findings: CBF (<30%) Volume: 18mL Perfusion (Tmax>6.0s) volume: 10mL Mismatch Volume: 63mL Infarction Location:None identified These results were communicated to Dr. Leonel Ramsay at 6:47 pmon 7/5/2022by text page via the [facility] messaging system. IMPRESSION: CTA neck: The common carotid, internal carotid and vertebral arteries are patent within the neck without hemodynamically significant stenosis. Atherosclerotic disease within the visualized aortic arch, proximal major branch vessels of the neck and bilateral carotid arteries as described. CTA head: No intracranial large vessel occlusion or proximal high-grade stenosis. CT perfusion head: The perfusion software identifies no core infarct. The perfusion software identifies no critically hypoperfused parenchyma  utilizing the Tmax>6 seconds threshold. No mismatch reported. Electronically Signed   By: Kellie Simmering DO   On: 03/06/2021 18:47    Review of Systems  Constitutional:  Positive for appetite change and unexpected weight change. Negative for activity change, chills, diaphoresis and fever.  HENT: Negative.    Respiratory: Negative.    Cardiovascular: Negative.   Gastrointestinal: Negative.   Genitourinary: Negative.   Musculoskeletal: Negative.   Neurological:  Positive for seizures. Negative for dizziness, tremors, speech difficulty, light-headedness and headaches.  Psychiatric/Behavioral: Negative.    Blood pressure (!) 151/75, pulse 61, temperature 98.2 F (36.8 C), temperature source Axillary, resp. rate 16, height 5\' 6"  (1.676 m), weight 83.5 kg, SpO2 99 %.   Physical Exam: Patient is in NAD, awake, A/O X 4, conversant, and in good spirits. Thought content appropriate. Speech is appropriate and fluent without evidence of aphasia. Able to follow 3 step commands without difficulty.  No dysarthria present. Tone and bulk:normal tone throughout; no atrophy noted. MAEW with good strength. Sensation intact.   Cranial Nerves: II: Visual fields grossly normal,  III,IV, VI: ptosis not present, extra-ocular motions intact bilaterally, round, reactive to light and accommodation, Pupils- right is 3 mm, left is 2 mm V,VII: smile symmetric, facial light touch sensation normal bilaterally VIII: hearing normal bilaterally IX,X: uvula rises symmetrically XI: bilateral shoulder shrug XII: midline tongue extension     Assessment/Plan:  75 year old female with numerous contrast-enhancing lesions bilaterally within the temporal and occipital lobes of unclear etiology and seizures. 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 LP was performed yesterday and awaiting final results. Plan for biopsy with Dr. Reatha Armour on 03/20/2021.  - Continue AEDs per neurology - Continue supportive care  [name], DNP,  NP-C 03/08/2021, 9:27 AM

## 2021-03-08 NOTE — Progress Notes (Signed)
Subjective: Continues to improve  Exam: Vitals:   03/08/21 1150 03/08/21 1200  BP:  (!) 155/82  Pulse:  85  Resp:  (!) 21  Temp: 98.2 F (36.8 C)   SpO2:  99%   Gen: In bed, NAD Resp: non-labored breathing, no acute distress Abd: soft, nt  Neuro: MS: Awake, alert, able to spell world backwards, oriented CN: Pupils equal round and reactive, left hemianopia Motor: Moves all extremities well Sensory: Intact light touch  Impression: 75 year old female who presents with multifocal mass with seizures.  Given the duration of symptoms prior to onset (confused at least a month) I think that infection is fairly unlikely, especially when combined with unremarkable CSF.  At this point, her seizures are well controlled and further diagnosis/management will be per neurosurgery.    Recommendations: 1) continue Keppra 1 g twice daily, changing to p.o. 2) biopsy/further management per neurosurgery 3) neurology will be available on an as-needed basis.  Roland Rack, MD [facility] [phone]  If 7pm- 7am, please page neurology on call as listed in [place].

## 2021-03-08 NOTE — Evaluation (Addendum)
Physical Therapy Evaluation Patient Details Name: Andrea [name] MRN: [id number] DOB: [birth year] Today's Date: 03/08/2021   History of Present Illness  Pt is a 75 y.o. female who presented 7/5 with seizure like activity and L-sided weakness. ETT 7/5 - 7/6. EEG suggestive of moderate diffuse encephalopathy, nonspecific etiology but could be secondary to sedation. 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 S/p LP 7/6 with unremarkable CSF. Plan for possible biopsy 7/12. PMH: HTN.   Clinical Impression  Pt presents with condition above and deficits mentioned below, see PT Problem List. PTA, she was independent, used to work for [employer], and lived with her husband on the main floor of the house with 5 STE with a rail on each side. Family can provided 24/7 care. Currently, pt displays some minor L sided weakness, gross incoordination, balance deficits, decreased activity tolerance, impaired cognition, and L hemianopia that impact her safety and independence with all functional mobility. She is at risk for falls and injuries. Pt becomes dizzy with mobility (BP changing appropriately during session with changes in position) which improved as she focused her vision on one object. Educated pt and her family to continue to encourage turning and looking to her L. Pt needing up to minA for functional mobility without UE support at this time. Recommend follow-up with HHPT while receiving 24/7 assistance at home. Will continue to follow acutely.    Follow Up Recommendations Home health PT;Supervision/Assistance - 24 hour    Equipment Recommendations  3in1 (PT)    Recommendations for Other Services       Precautions /  Restrictions Precautions Precautions: Fall Precaution Comments: L hemianopia Restrictions Weight Bearing Restrictions: No      Mobility  Bed Mobility Overal bed mobility: Needs Assistance Bed Mobility: Supine to Sit;Sit to Supine     Supine to sit: Supervision;HOB elevated Sit to supine: Supervision   General bed mobility comments: Supervision for safety, pt requiring increased time for all.    Transfers Overall transfer level: Needs assistance Equipment used: Rolling walker (2 wheeled) Transfers: Sit to/from Stand Sit to Stand: Min assist         General transfer comment: MinA to steady with transfer to stand to RW 2x from EOB, 1st time became dizzy immediately and sat (BP checked and was appropriately changing with positions sitting > standing 2nd rep).  Ambulation/Gait Ambulation/Gait assistance: Min assist Gait Distance (Feet): 40 Feet Assistive device: Rolling walker (2 wheeled);None Gait Pattern/deviations: Step-through pattern;Decreased stride length Gait velocity: reduced Gait velocity interpretation: <1.31 ft/sec, indicative of household ambulator General Gait Details: Pt initially ambulating with RW but quickly took away RW as pt appeared fairly steady. Pt displayed decreased gait speed and bil step length with moments of lateral LOB when she would become dizzy, minA to recover. Cued pt to focus eyes on a target, especially when turning cuing to turn head, find an object to focus on, turn body, walk to target. Success noted with this tactic as pt displayed improved balance and denied further dizziness when she did so.  Stairs            Wheelchair Mobility    Modified Rankin (Stroke Patients Only) Modified Rankin (Stroke Patients Only) Pre-Morbid Rankin Score: No symptoms Modified Rankin: Moderately severe disability     Balance Overall  balance assessment: Needs assistance Sitting-balance support: No upper extremity supported;Feet supported Sitting  balance-Leahy Scale: Fair Sitting balance - Comments: Supervision sitting statically EOB.   Standing balance support: No upper extremity supported;During functional activity Standing balance-Leahy Scale: Fair Standing balance comment: Able to ambulate without UE support but moments of minor LOB needing up to minA to recover.                             Pertinent Vitals/Pain Pain Assessment: No/denies pain    Home Living Family/patient expects to be discharged to:: Private residence Living Arrangements: Spouse/significant other Available Help at Discharge: Family;Available 24 hours/day Type of Home: House Home Access: Stairs to enter Entrance Stairs-Rails: Psychiatric nurse of Steps: 5 Home Layout: One level;Laundry or work area in [employer]: None      Prior Function Level of Independence: Independent         Comments: Pt used to work for [employer]. Pt drives.     Hand Dominance   Dominant Hand: Right    Extremity/Trunk Assessment   Upper Extremity Assessment Upper Extremity Assessment: LUE deficits/detail LUE Deficits / Details: Minor weakness noted compared to R with MMT grossly of 4+ to 5; no dysdiadochokiensia noted bil; overshooting with bil hands with finger to nose <> finger; denied numbness/tingling bil    Lower Extremity Assessment Lower Extremity Assessment: LLE deficits/detail LLE Deficits / Details: Minor weakness noted compared to R with MMT grossly of 4+ to 5; no dysdiadochokiensia or dysmetria noted bil; denies numbness/tingling bil    Cervical / Trunk Assessment Cervical / Trunk Assessment: Normal  Communication   Communication: No difficulties  Cognition Arousal/Alertness: Awake/alert Behavior During Therapy: WFL for tasks assessed/performed Overall Cognitive Status: Impaired/Different from baseline Area of Impairment: Orientation;Memory;Following commands;Problem solving;Awareness;Safety/judgement                  Orientation Level: Disoriented to;Time (able to identify month and year and day of week but not exact date)   Memory: Decreased short-term memory Following Commands: Follows multi-step commands inconsistently;Follows one step commands with increased time Safety/Judgement: Decreased awareness of deficits;Decreased awareness of safety Awareness: Emergent Problem Solving: Slow processing;Difficulty sequencing;Requires verbal cues General Comments: Pt needing repeated single step cues to turn head, focus on object, and then walk toward object to reduce her dizziness. Pt with difficulty sequencing tasks, benefiting from demonstration. Slow processing.      General Comments General comments (skin integrity, edema, etc.): L eye having difficulty tracking laterally; no detection of object on L side from periphery until it reached midline anterior to pt; R peripheral appears intact    Exercises     Assessment/Plan    PT Assessment Patient needs continued PT services  PT Problem List Decreased strength;Decreased activity tolerance;Decreased balance;Decreased mobility;Decreased coordination;Decreased cognition;Decreased safety awareness;Decreased knowledge of use of DME;Decreased knowledge of precautions       PT Treatment Interventions DME instruction;Gait training;Stair training;Functional mobility training;Therapeutic activities;Therapeutic exercise;Balance training;Neuromuscular re-education;Cognitive remediation;Patient/family education    PT Goals (Current goals can be found in the Care Plan section)  Acute Rehab PT Goals Patient Stated Goal: to go home PT Goal Formulation: With patient/family Time For Goal Achievement: 03/22/21 Potential to Achieve Goals: Good    Frequency Min 4X/week   Barriers to discharge        Co-evaluation               AM-PAC PT "6 Clicks" Mobility  Outcome Measure  Help needed turning from your back to your side while in a  flat bed without using bedrails?: A Little Help needed moving from lying on your back to sitting on the side of a flat bed without using bedrails?: A Little Help needed moving to and from a bed to a chair (including a wheelchair)?: A Little Help needed standing up from a chair using your arms (e.g., wheelchair or bedside chair)?: A Little Help needed to walk in hospital room?: A Little Help needed climbing 3-5 steps with a railing? : A Little 6 Click Score: 18    End of Session Equipment Utilized During Treatment: Gait belt Activity Tolerance: Patient tolerated treatment well Patient left: in bed;with call bell/phone within reach;with bed alarm set;with family/visitor present Nurse Communication: Mobility status PT Visit Diagnosis: Unsteadiness on feet (R26.81);Other abnormalities of gait and mobility (R26.89);Muscle weakness (generalized) (M62.81);Difficulty in walking, not elsewhere classified (R26.2);Other symptoms and signs involving the nervous system (R29.898);Dizziness and giddiness (R42)    Time: 2952-8413 PT Time Calculation (min) (ACUTE ONLY): 43 min   Charges:   PT Evaluation $PT Eval Moderate Complexity: 1 Mod PT Treatments $Gait Training: 8-22 mins $Therapeutic Activity: 8-22 mins        Moishe Spice, PT, DPT Acute Rehabilitation Services  Pager: [phone] Office: [place] 03/08/2021, 6:36 PM

## 2021-03-09 ENCOUNTER — Inpatient Hospital Stay (HOSPITAL_COMMUNITY): Payer: Medicare Other

## 2021-03-09 ENCOUNTER — Encounter (HOSPITAL_COMMUNITY): Payer: Self-pay | Admitting: Internal Medicine

## 2021-03-09 ENCOUNTER — Other Ambulatory Visit: Payer: Self-pay | Admitting: Neurological Surgery

## 2021-03-09 DIAGNOSIS — G939 Disorder of brain, unspecified: Secondary | ICD-10-CM

## 2021-03-09 DIAGNOSIS — R7881 Bacteremia: Secondary | ICD-10-CM | POA: Diagnosis not present

## 2021-03-09 DIAGNOSIS — R569 Unspecified convulsions: Secondary | ICD-10-CM | POA: Diagnosis not present

## 2021-03-09 DIAGNOSIS — G060 Intracranial abscess and granuloma: Secondary | ICD-10-CM

## 2021-03-09 DIAGNOSIS — C719 Malignant neoplasm of brain, unspecified: Secondary | ICD-10-CM

## 2021-03-09 DIAGNOSIS — D72829 Elevated white blood cell count, unspecified: Secondary | ICD-10-CM

## 2021-03-09 LAB — BLOOD CULTURE ID PANEL (REFLEXED) - BCID2

## 2021-03-09 LAB — GLUCOSE, CAPILLARY
Glucose-Capillary: 100 mg/dL — ABNORMAL HIGH (ref 70–99)
Glucose-Capillary: 111 mg/dL — ABNORMAL HIGH (ref 70–99)
Glucose-Capillary: 114 mg/dL — ABNORMAL HIGH (ref 70–99)
Glucose-Capillary: 134 mg/dL — ABNORMAL HIGH (ref 70–99)
Glucose-Capillary: 158 mg/dL — ABNORMAL HIGH (ref 70–99)
Glucose-Capillary: 81 mg/dL (ref 70–99)
Glucose-Capillary: 99 mg/dL (ref 70–99)

## 2021-03-09 LAB — CULTURE, BLOOD (ROUTINE X 2)

## 2021-03-09 LAB — HSV 1/2 PCR, CSF
HSV-1 DNA: NEGATIVE
HSV-2 DNA: NEGATIVE

## 2021-03-09 LAB — CEA: CEA: 1.5 ng/mL (ref 0.0–4.7)

## 2021-03-09 IMAGING — CT CT HEAD W/O CM
3 series · 16 of 47 positions shown, 19 images · non-contrast
Comparison: CT head [DATE].  MRI head [DATE].

CLINICAL DATA: Stereotactic CT for surgical planning.

EXAM:
CT HEAD WITHOUT CONTRAST
TECHNIQUE: Contiguous axial images were obtained from the base of the skull
through the vertex without intravenous contrast.

[Series 3: ax standard · axial · 0.41mm/px · z∈[+968,+1105]mm · 10 of 159 slices shown, 13 images]
[im 11/159  brain]
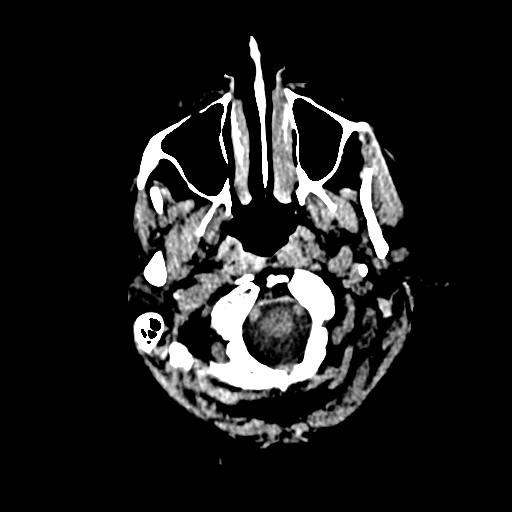
[im 11/159  bone]
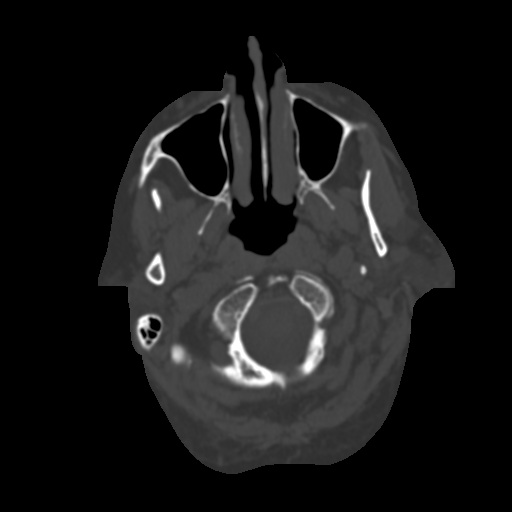
[im 28/159  brain]
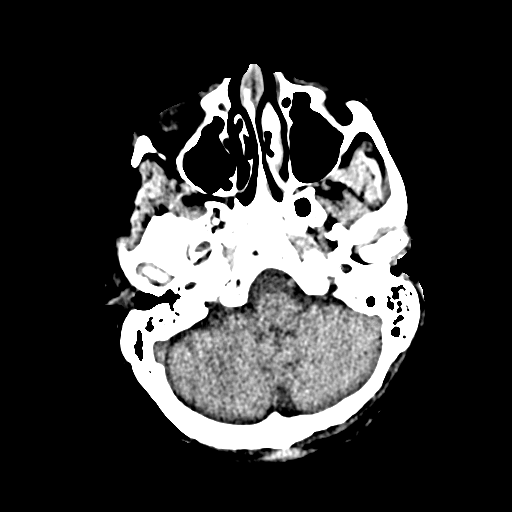
[im 44/159  brain]
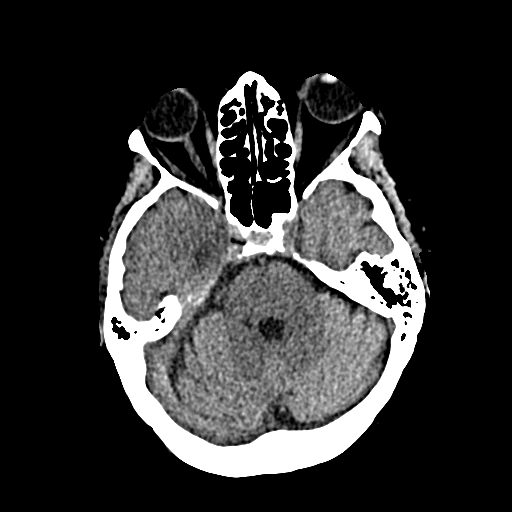
[im 55/159  brain]
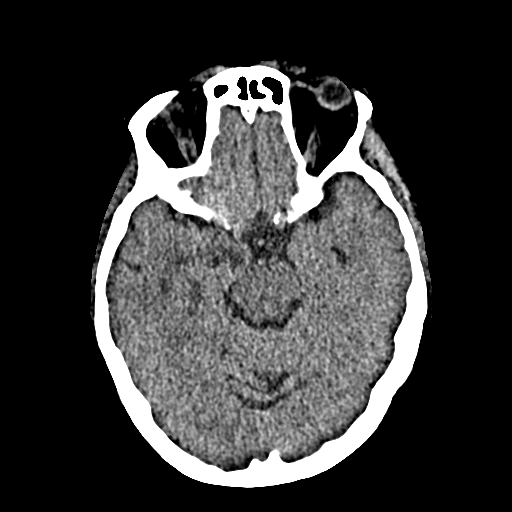
[im 71/159  brain]
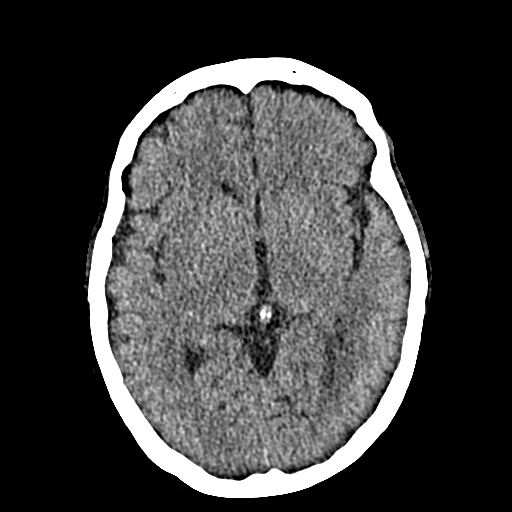
[im 71/159  bone]
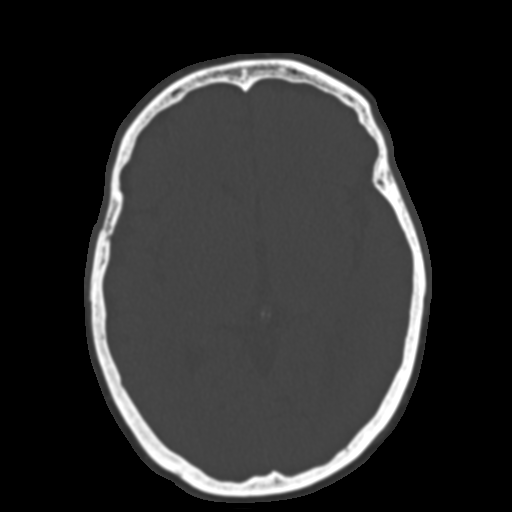
[im 88/159  brain]
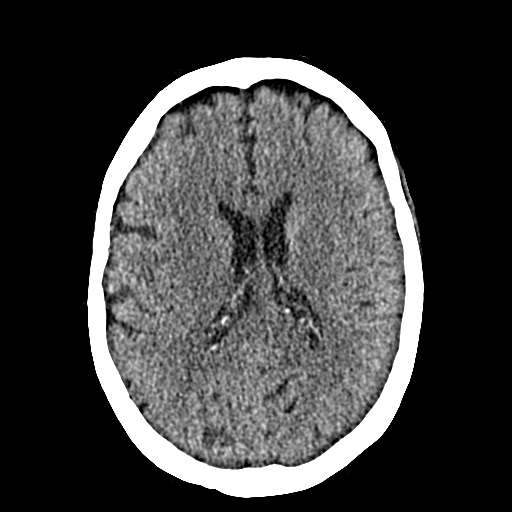
[im 104/159  brain]
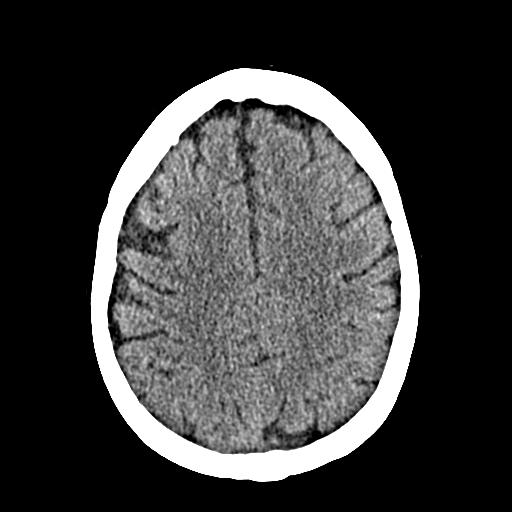
[im 120/159  brain]
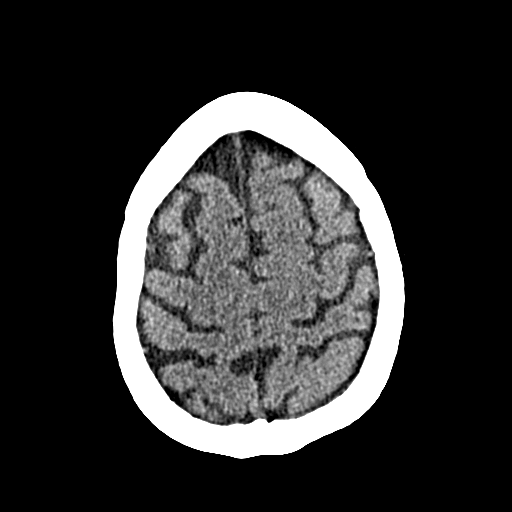
[im 131/159  brain]
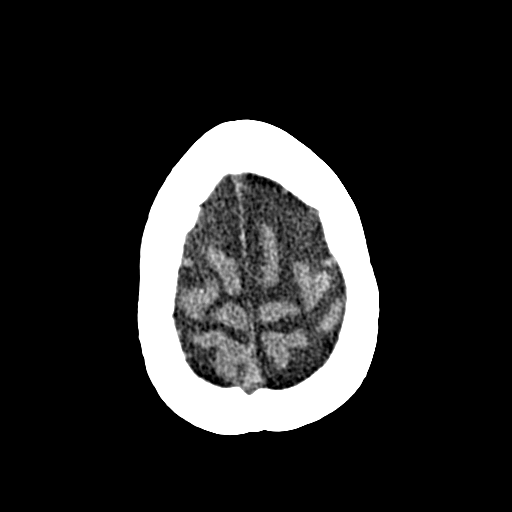
[im 131/159  bone]
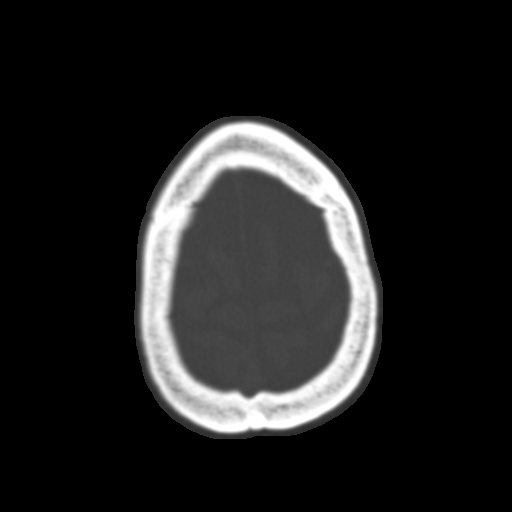
[im 148/159  brain]
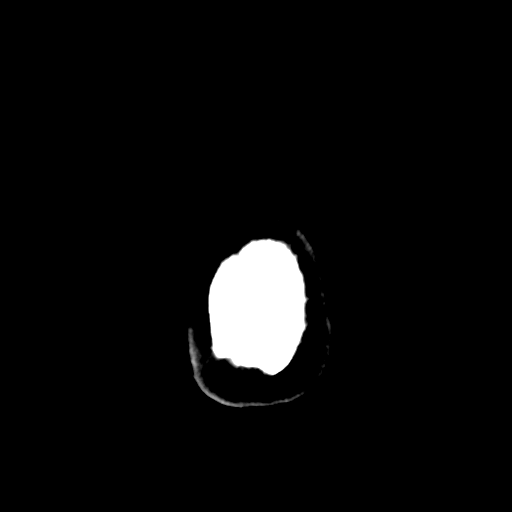

[Series 5: cor · coronal · 0.34mm/px · 3 of 213 slices shown]
[im 71/213  brain]
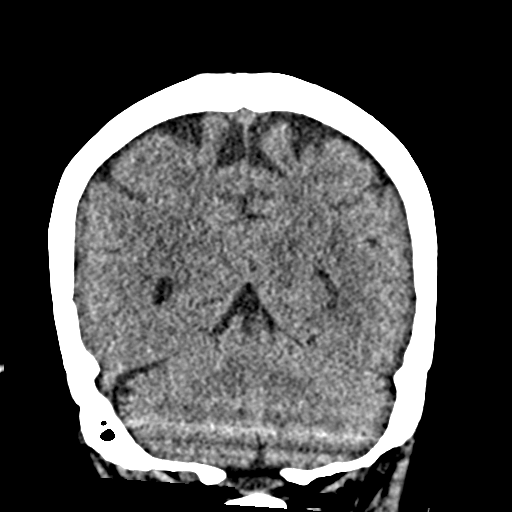
[im 95/213  brain]
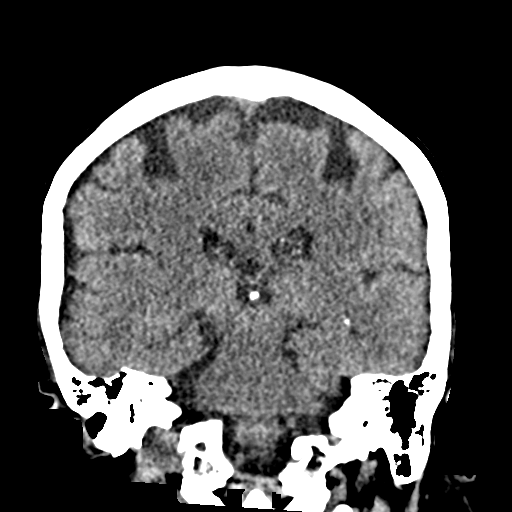
[im 118/213  brain]
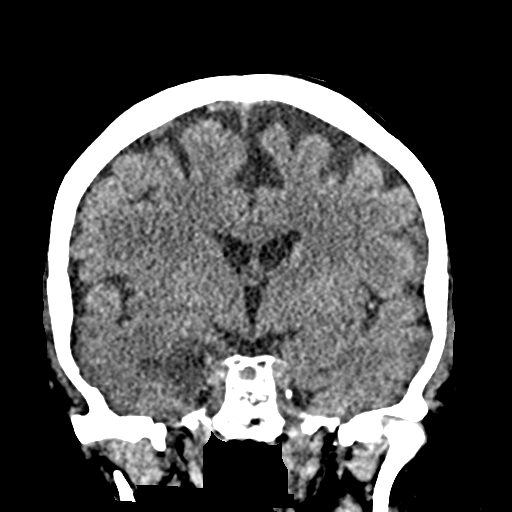

[Series 6: sag · sagittal · 0.33mm/px · 3 of 174 slices shown]
[im 58/174  brain]
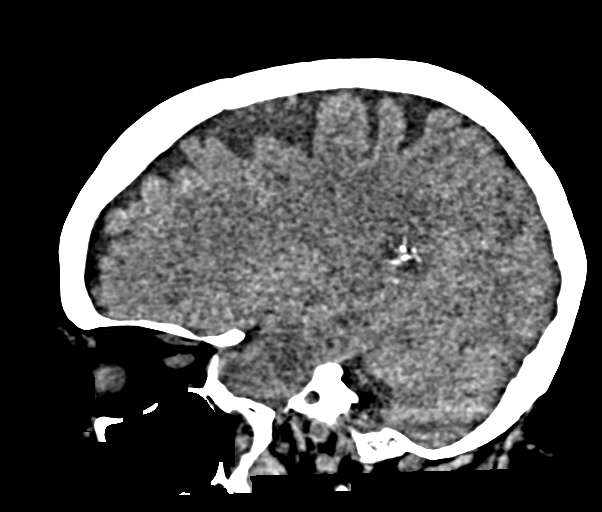
[im 87/174  brain]
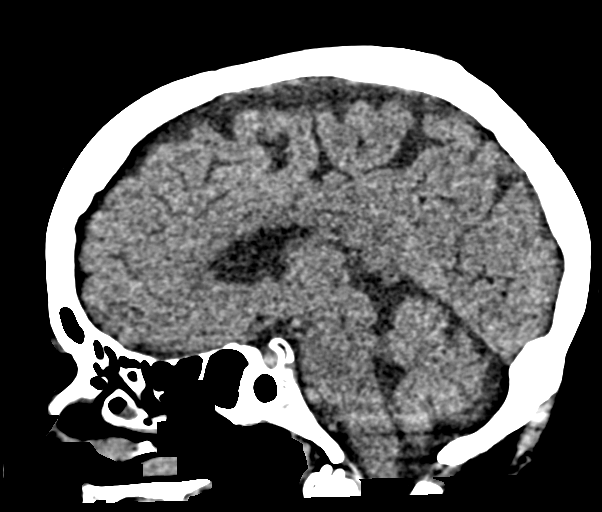
[im 116/174  brain]
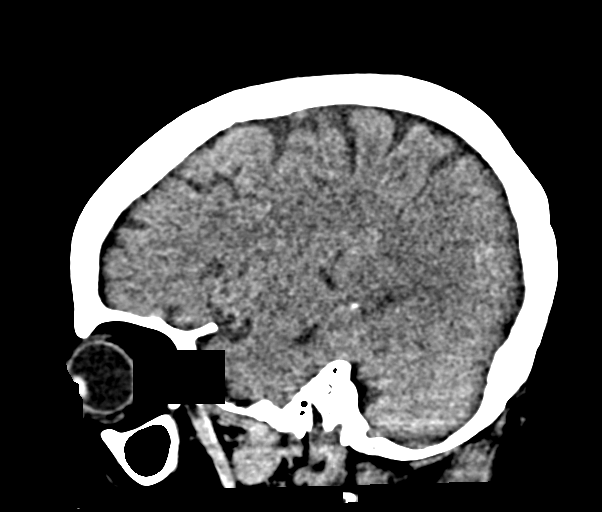

[16 of 47 positions shown; findings below may reference images not displayed]

FINDINGS: Limited surgical planning CT. Known multifocal enhancing lesions
were better characterized on recent MRI. No unexpected findings on
this study. No evidence of new/interval large vascular territory
infarct, acute hemorrhage, midline shift, or hydrocephalus.

No acute calvarial fracture. Mild ethmoid air cell mucosal
thickening with otherwise clear sinuses. No acute orbital findings.
No mastoid effusions.
IMPRESSION: Limited treatment planning CT. No unexpected findings. Please see
recent MRI for characterization of multiple known enhancing lesions.

## 2021-03-09 MED ORDER — SODIUM CHLORIDE 0.9 % IV SOLN
2.0000 g | Freq: Three times a day (TID) | INTRAVENOUS | Status: DC
  Administered 2021-03-09: 2 g via INTRAVENOUS
  Filled 2021-03-09 (×5): qty 2

## 2021-03-09 MED ORDER — METRONIDAZOLE 500 MG/100ML IV SOLN
500.0000 mg | Freq: Three times a day (TID) | INTRAVENOUS | Status: DC
  Administered 2021-03-09 – 2021-03-16 (×20): 500 mg via INTRAVENOUS
  Filled 2021-03-09 (×20): qty 100

## 2021-03-09 MED ORDER — SODIUM CHLORIDE 0.9 % IV SOLN
2.0000 g | Freq: Two times a day (BID) | INTRAVENOUS | Status: DC
  Administered 2021-03-09 – 2021-03-16 (×13): 2 g via INTRAVENOUS
  Filled 2021-03-09 (×5): qty 20
  Filled 2021-03-09: qty 2
  Filled 2021-03-09 (×5): qty 20
  Filled 2021-03-09: qty 2
  Filled 2021-03-09 (×3): qty 20

## 2021-03-09 NOTE — Progress Notes (Addendum)
Subjective: Patient reports that she is doing well. She was resting peacefully on my arrival and had no complaints.  Objective: Vital signs in last 24 hours: Temp:  [97.8 F (36.6 C)-98.2 F (36.8 C)] 97.8 F (36.6 C) (07/08 0800) Pulse Rate:  [67-101] 74 (07/07 2346) Resp:  [14-21] 14 (07/08 0401) BP: (133-171)/(59-85) 143/75 (07/08 0401) SpO2:  [91 %-99 %] 97 % (07/08 0401)  Intake/Output from previous day: 07/07 0701 - 07/08 0700 In: 322.7 [I.V.:222.7; IV Piggyback:100] Out: 800 [Urine:800] Intake/Output this shift: No intake/output data recorded.  Physical Exam: Patient is in NAD, awake, A/O X 4, conversant, and in good spirits. Although she was OX4, she appeared more confused this morning and repeatedly asked me what time it was. Follows commands without difficulty.  No aphasia/dysarthria present. MAEW with good strength. Sensation intact. Slight left hemianopsia.  Lab Results: Recent Labs    03/07/21 0328 03/07/21 0435 03/07/21 1134  WBC 16.7*  --  14.0*  HGB 11.3* 10.5* 12.0  HCT 34.0* 31.0* 37.2  PLT 259  --  206   BMET Recent Labs    03/07/21 0328 03/07/21 0435 03/07/21 1134  NA 138 140 136  K 3.3* 3.2* 4.3  CL 109  --  108  CO2 22  --  21*  GLUCOSE 81  --  97  BUN 15  --  12  CREATININE 0.77  --  0.67  CALCIUM 8.0*  --  8.4*    Studies/Results: US Abdomen Complete  Result Date: 03/08/2021 CLINICAL DATA:  Nausea vomiting. EXAM: ABDOMEN ULTRASOUND COMPLETE COMPARISON:  No prior. FINDINGS: Gallbladder: Limited evaluation due to patient's body habitus. 6 mm mobile nonshadowing echo density consistent with nonshadowing stone or tumefactive sludge. Gallbladder wall thickness normal. Negative Murphy sign. Common bile duct: Diameter: 3 mm Liver: Increased hepatic echogenicity consistent with fatty infiltration and or hepatocellular disease. Slightly nodular contour noted about the left hepatic lobe. Possibility of cirrhosis should be considered. No focal hepatic  abnormality identified. Portal vein is patent on color Doppler imaging with normal direction of blood flow towards the liver. IVC: No abnormality visualized. Pancreas: Visualized portion unremarkable. Spleen: Size and appearance within normal limits. Right Kidney: Length: 10.8 cm. Echogenicity within normal limits. No mass or hydronephrosis visualized. Left Kidney: Length: 11.3 cm. Echogenicity within normal limits. 4.5 cm simple cyst. No hydronephrosis visualized. Abdominal aorta: No aneurysm visualized. Other findings: Right pleural effusion incidentally noted. IMPRESSION: 1. Limited evaluation due to patient's body habitus. 6 mm mobile nonshadowing echo density consistent with nonshadowing gallstone or tumefactive sludge noted within the gallbladder. No evidence of cholecystitis. No biliary distention. 2. Increased hepatic echogenicity consistent with fatty infiltration or hepatocellular disease. Slightly nodular hepatic contour suggesting the possibility of cirrhosis. No focal hepatic abnormality identified. 3.  4.5 cm simple left renal cyst. 4.  Right pleural effusion incidentally noted. 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 Plan for biopsy with Dr. Reatha Armour on 03/13/2021 if LP is non-diagnostic. Awaiting final LP results.    - Continue AEDs per neurology - Continue supportive care    LOS: 3 days     [name], DNP, NP-C 03/09/2021, 9:37 AM  Addendum:  Patient seen and examined  Agree with above  Plan for right parieto-occipital open biopsy on 03/13/2021  Stereo CT pending     Thank you for allowing me to  participate in this patient's care.  Please do not hesitate to call with questions or concerns.   Elwin Sleight, [facility] Cell: [phone]

## 2021-03-09 NOTE — Progress Notes (Signed)
Physical Therapy Treatment Patient Details Name: Andrea [name] MRN: [id number] DOB: Jun 21, 1946 Today's Date: 03/09/2021    History of Present Illness Pt is a 75 y.o. female who presented 7/5 with seizure like activity and L-sided weakness. ETT 7/5 - 7/6. EEG suggestive of moderate diffuse encephalopathy, nonspecific etiology but could be secondary to sedation. 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 S/p LP 7/6 with unremarkable CSF. Plan for possible biopsy 7/12. PMH: HTN.    PT Comments    Pt supine in bed on entry with [place] in place no supplemental O2 running. Pt happy to have [place] removed and able to maintain SaO2 >92%O2 throughout session including ambulation. Pt agreeable to therapy and is making progress towards her goals, however continues to have dizziness with movement which is made better with gaze stabilization techniques. Pt currently supervision for bed mobility and min guard for transfers and ambulation with RW. Pt need increased cuing for gaze stabilization as short term memory is impacted. D/c plans remain appropriate at this time. PT will continue to follow acutely.     Follow Up Recommendations  Home health PT;Supervision/Assistance - 24 hour     Equipment Recommendations  3in1 (PT);Rolling walker with 5" wheels       Precautions / Restrictions Precautions Precautions: Fall Precaution Comments: L hemianopia Restrictions Weight Bearing Restrictions: No    Mobility  Bed Mobility Overal bed mobility: Needs Assistance Bed Mobility: Supine to Sit;Sit to Supine     Supine to sit: Supervision;HOB elevated Sit to supine: Supervision   General bed mobility comments: supervision for safety and  lines    Transfers Overall transfer level: Needs assistance Equipment used: Rolling walker (2 wheeled) Transfers: Sit to/from Stand Sit to Stand: Min guard         General transfer comment: min guard for safety vc for hand placement for power up  Ambulation/Gait Ambulation/Gait assistance: Min guard Gait Distance (Feet): 80 Feet Assistive device: Rolling walker (2 wheeled) Gait Pattern/deviations: Step-through pattern;Decreased stride length Gait velocity: reduced Gait velocity interpretation: <1.31 ft/sec, indicative of household ambulator General Gait Details: ambulated with RW for improved balance so that pt could focus on utilizing gaze stabilization, increased vc for target finding    Modified Rankin (Stroke Patients Only) Modified Rankin (Stroke Patients Only) Pre-Morbid Rankin Score: No symptoms Modified Rankin: Moderately severe disability     Balance Overall balance assessment: Needs assistance   Sitting balance-Leahy Scale: Good     Standing balance support: No upper extremity supported;During functional activity Standing balance-Leahy Scale: Fair Standing balance comment: able to static stand for therapist to put on mask without UE support                            Cognition Arousal/Alertness: Awake/alert Behavior During Therapy: WFL for tasks assessed/performed Overall Cognitive Status: Impaired/Different from baseline Area of Impairment: Orientation;Memory;Following commands;Problem solving;Awareness;Safety/judgement                 Orientation Level: Disoriented to;Time;Situation (repeatedly asking)   Memory: Decreased short-term memory Following Commands: Follows multi-step commands inconsistently;Follows one step commands with increased time Safety/Judgement: Decreased awareness of deficits;Decreased awareness of safety Awareness: Intellectual Problem Solving: Slow processing;Difficulty sequencing;Requires verbal cues General  Comments: able to recall she needs to  work on gaze stabilization however while ambulating has decreased short term memory requiring constant reminding of gaze stabilization         General Comments General comments (skin integrity, edema, etc.): daughter present and supportive,      Pertinent Vitals/Pain Pain Assessment: No/denies pain     PT Goals (current goals can now be found in the care plan section) Acute Rehab PT Goals Patient Stated Goal: to go home PT Goal Formulation: With patient/family Time For Goal Achievement: 03/22/21 Potential to Achieve Goals: Good Progress towards PT goals: Progressing toward goals    Frequency    Min 4X/week      PT Plan Current plan remains appropriate       AM-PAC PT "6 Clicks" Mobility   Outcome Measure  Help needed turning from your back to your side while in a flat bed without using bedrails?: A Little Help needed moving from lying on your back to sitting on the side of a flat bed without using bedrails?: A Little Help needed moving to and from a bed to a chair (including a wheelchair)?: A Little Help needed standing up from a chair using your arms (e.g., wheelchair or bedside chair)?: A Little Help needed to walk in hospital room?: A Little Help needed climbing 3-5 steps with a railing? : A Lot 6 Click Score: 17    End of Session Equipment Utilized During Treatment: Gait belt Activity Tolerance: Patient tolerated treatment well Patient left: in bed;with call bell/phone within reach;with bed alarm set;with family/visitor present Nurse Communication: Mobility status PT Visit Diagnosis: Unsteadiness on feet (R26.81);Other abnormalities of gait and mobility (R26.89);Muscle weakness (generalized) (M62.81);Difficulty in walking, not elsewhere classified (R26.2);Other symptoms and signs involving the nervous system (R29.898);Dizziness and giddiness (R42)     Time: 6834-1962 PT Time Calculation (min) (ACUTE ONLY): 30  min  Charges:  $Gait Training: 8-22 mins $Therapeutic Activity: 8-22 mins                     Brooklyne Radke B. Migdalia Dk PT, DPT Acute Rehabilitation Services Pager [phone] Office [phone]    [place] 03/09/2021, 3:50 PM

## 2021-03-09 NOTE — Progress Notes (Signed)
eLink Physician-Brief Progress Note Patient Name: Daijanae Rafalski DOB: [birth year] MRN: [id number]   Date of Service  03/09/2021  HPI/Events of Note  Notified by pharmacy that this patient has GNRs growing in 1 of 2 BCx from 7/6. Not able to be identified on BCID.  Patient has multiple ring-enhancing lesions on MRI Brain. I personally reviewed the MRI from 03/07/2021. The lesions are thick-walled and T1 hyperintense and T2 hypointense. There is some grapelike clustering. There is central diffusion-restriction.   eICU Interventions  Findings on MRI raise suspicion for CNS abscess.  Will start patient on meropenem and obtain repeat blood cultures.  Recommend requesting an ID consult in AM.  Since BCID did not identify the causative organism, bacteria not on the BCID screen (including the gram-variable Actinomyces and Nocardia species) may need to be considered.     Intervention Category Intermediate Interventions: Infection - evaluation and management  Marily Lente Earlie Arciga 03/09/2021, 2:03 AM

## 2021-03-09 NOTE — Progress Notes (Signed)
PHARMACY - PHYSICIAN COMMUNICATION CRITICAL VALUE ALERT - BLOOD CULTURE IDENTIFICATION (BCID)  Andrea [name] is an 75 y.o. female who presented to [facility] on 03/06/2021  Assessment:  1/2 GNR not identified on BCID  Name of physician (or Provider) Contacted: Dr. Gillermina Phy (CCM) - [facility] had not taken over service yet   Current antibiotics: No current antibiotics   Changes to prescribed antibiotics recommended: Start meropenem 2g IV q8h  Recommendations accepted by provider  No results found for this or any previous visit.  Cristela Felt, PharmD Clinical Pharmacist  03/09/2021  1:25 AM

## 2021-03-09 NOTE — Consult Note (Signed)
[facility] for Infectious Disease    Date of Admission:  03/06/2021     Reason for Consult: GNR bacteremia     Referring Physician: Dr Lonny Prude  Current antibiotics: Meropenem 03/09/21--present   ASSESSMENT:    Moraxella species bacteremia Multiple ring-enhancing brain lesions concerning for possible brain abscess versus malignancy Seizures Leukocytosis  PLAN:    Ceftriaxone 2gm q12h and MTZ 568m q8h DC meropenem Follow up repeat blood cultures Await aspiration/biopsy with NS[facility] on 03/13/21 Dr SBaxter Flatteryavailable as needed over the weekend   Principal Problem:   Gram-negative bacteremia Active Problems:   Seizure ([place]   Leukocytosis   Brain abscess   Brain lesion   MEDICATIONS:    Scheduled Meds:  chlorhexidine gluconate (MEDLINE KIT)  15 mL Mouth Rinse BID   Chlorhexidine Gluconate Cloth  6 each Topical Daily   docusate  100 mg Oral BID   heparin  5,000 Units Subcutaneous Q8H   insulin aspart  0-15 Units Subcutaneous Q4H   levETIRAcetam  1,000 mg Oral BID   lisinopril  5 mg Oral Daily   mouth rinse  15 mL Mouth Rinse BID   pantoprazole sodium  40 mg Oral QHS   polyethylene glycol  17 g Per Tube Daily   sodium chloride flush  3 mL Intravenous Once   Continuous Infusions:  cefTRIAXone (ROCEPHIN)  IV     metronidazole     PRN Meds:.docusate sodium, ondansetron (ZOFRAN) IV, polyethylene glycol  HPI:    Andrea [name] a 75y.o. female with a past medical history significant for hypertension who was admitted 03/06/2021 after being found unresponsive [place] on the highway.  She was noted to have 3 witnessed seizures prior to arrival to the hospital.  Upon admission she was minimally responsive and subsequently intubated for airway protection.  MRI of the brain with and without contrast showed numerous contrast-enhancing lesions bilaterally within the temporal and occipital lobes.  This was concerning for cerebritis versus a neoplastic process such as glioma  or metastatic disease.  She was briefly on acyclovir however this was discontinued as herpes encephalitis was felt to be unlikely.  She underwent lumbar puncture which was unremarkable with only 1 white blood cell, normal glucose, and mildly elevated protein.  HSV PCR from the CSF was also negative.  CSF cytology was negative for any malignant cells.  She has also been afebrile during her admission and obvious infectious source.  Her WBC is elevated at 14.0.  Admission blood cultures overnight turn positive for Moraxella species and she was empirically started on meropenem overnight.  Repeat blood cultures have been drawn this morning.  She has subsequently been extubated and moved out of the ICU.  She is currently accompanied by her husband and daughter.  They report several weeks of increased forgetfulness but otherwise denies any recent illnesses.  She has not been having any fevers, chills, decreased energy.  No abdominal pain.  She has had some intermittent nausea that comes and goes without any obvious inciting factors.  No headaches.  No tooth pain, no sinus pain, no recent earaches.  She was evaluated by neurosurgery who is planning for a possible biopsy on Tuesday 7/12.   Past Medical History:  Diagnosis Date   Hypertension        Family History  Problem Relation Age of Onset   Brain cancer Neg Hx     Allergies  Allergen Reactions   Penicillins Rash    Review of Systems  Constitutional:  Negative for chills, fever and malaise/fatigue.  HENT:  Negative for congestion, ear pain, sinus pain and sore throat.   Respiratory: Negative.    Cardiovascular: Negative.   Gastrointestinal:  Positive for nausea. Negative for abdominal pain, constipation and diarrhea.  Genitourinary: Negative.   Musculoskeletal: Negative.   Skin: Negative.   Neurological:  Negative for dizziness and headaches.  Psychiatric/Behavioral:         + forgetfullness   OBJECTIVE:   Blood pressure (!) 153/72,  pulse 85, temperature 98.1 F (36.7 C), temperature source Oral, resp. rate 19, height _0  (1.676 m), weight 84 kg, SpO2 97 %. Body mass index is 29.89 kg/m.  Physical Exam Constitutional:      General: She is not in acute distress.    Appearance: Normal appearance.  HENT:     Head: Normocephalic and atraumatic.     Mouth/Throat:     Mouth: Mucous membranes are moist.     Pharynx: Oropharynx is clear.  Eyes:     Extraocular Movements: Extraocular movements intact.     Conjunctiva/sclera: Conjunctivae normal.  Cardiovascular:     Rate and Rhythm: Normal rate and regular rhythm.     Heart sounds: No murmur heard. Pulmonary:     Effort: Pulmonary effort is normal. No respiratory distress.     Breath sounds: Normal breath sounds.  Abdominal:     General: There is no distension.     Palpations: Abdomen is soft.     Tenderness: There is no abdominal tenderness.  Musculoskeletal:        General: Normal range of motion.  Skin:    General: Skin is warm and dry.     Findings: No rash.  Neurological:     General: No focal deficit present.     Mental Status: She is alert.     Comments: Oriented to person and place, not time.   Psychiatric:        Mood and Affect: Mood normal.        Behavior: Behavior normal.     Lab Results: Lab Results  Component Value Date   WBC 14.0 (H) 03/07/2021   HGB 12.0 03/07/2021   HCT 37.2 03/07/2021   MCV 98.7 03/07/2021   PLT 206 03/07/2021    Lab Results  Component Value Date   NA 136 03/07/2021   K 4.3 03/07/2021   CO2 21 (L) 03/07/2021   GLUCOSE 97 03/07/2021   BUN 12 03/07/2021   CREATININE 0.67 03/07/2021   CALCIUM 8.4 (L) 03/07/2021   GFRNONAA >60 03/07/2021    Lab Results  Component Value Date   ALT 24 03/07/2021   AST 35 03/07/2021   ALKPHOS 57 03/07/2021   BILITOT 0.9 03/07/2021    No results found for: CRP  No results found for: ESRSEDRATE  I have reviewed the micro and lab results in Epic.  Imaging: US Abdomen  Complete  Result Date: 03/08/2021 CLINICAL DATA:  Nausea vomiting. EXAM: ABDOMEN ULTRASOUND COMPLETE COMPARISON:  No prior. FINDINGS: Gallbladder: Limited evaluation due to patient's body habitus. 6 mm mobile nonshadowing echo density consistent with nonshadowing stone or tumefactive sludge. Gallbladder wall thickness normal. Negative Murphy sign. Common bile duct: Diameter: 3 mm Liver: Increased hepatic echogenicity consistent with fatty infiltration and or hepatocellular disease. Slightly nodular contour noted about the left hepatic lobe. Possibility of cirrhosis should be considered. No focal hepatic abnormality identified. Portal vein is patent on color Doppler imaging with normal direction of blood flow towards the  liver. IVC: No abnormality visualized. Pancreas: Visualized portion unremarkable. Spleen: Size and appearance within normal limits. Right Kidney: Length: 10.8 cm. Echogenicity within normal limits. No mass or hydronephrosis visualized. Left Kidney: Length: 11.3 cm. Echogenicity within normal limits. 4.5 cm simple cyst. No hydronephrosis visualized. Abdominal aorta: No aneurysm visualized. Other findings: Right pleural effusion incidentally noted. IMPRESSION: 1. Limited evaluation due to patient's body habitus. 6 mm mobile nonshadowing echo density consistent with nonshadowing gallstone or tumefactive sludge noted within the gallbladder. No evidence of cholecystitis. No biliary distention. 2. Increased hepatic echogenicity consistent with fatty infiltration or hepatocellular disease. Slightly nodular hepatic contour suggesting the possibility of cirrhosis. No focal hepatic abnormality identified. 3.  4.5 cm simple left renal cyst. 4.  Right pleural effusion incidentally noted. Electronically Signed   By: Marcello Moores  Register   On: 03/08/2021 13:36     Imaging independently reviewed in Epic.  Raynelle [facility] for Infectious Disease [facility] [phone]  pager 03/09/2021, 5:06 PM

## 2021-03-09 NOTE — TOC Initial Note (Signed)
Transition of Care [facility]) - Initial/Assessment Note    Patient Details  Name: Andrea [name] MRN: [id number] Date of Birth: Oct 03, 1945  Transition of Care [facility]) CM/SW Contact:    Andrea Meeker, RN Phone Number: 03/09/2021, 12:49 PM  Clinical Narrative:   Case manager spoke with patient's daughter, Andrea [name]-  [phone]  to discuss her mom's discharge needs. Choice for Home Health agencies offered, they have no preference and gave permission for CM to call referral to [facility] liaison. Patient lives home with husband, has 4 steps with railings  to enter single level home. Will have family support at discharge. Andrea [name] requests that she be called to arrange [place] visits because her mom is sometimes forgetful. CM provided this information to Andrea [name] [facility] liaison.     Expected Discharge Plan: [place] Barriers to Discharge: Continued Medical Work up   Patient Goals and CMS Choice     Choice offered to / list presented to : Adult Children (daughter: Andrea [name])  Expected Discharge Plan and Services Expected Discharge Plan: [place] In-house Referral: NA Discharge Planning Services: CM Consult Post Acute Care Choice: Durable Medical Equipment, Home Health Living arrangements for the past 2 months: Single Family Home                 DME Arranged: 3-N-1, Walker rolling DME Agency: [employer] Date DME Agency Contacted: 03/09/21 Time DME Agency Contacted: [id number] Representative spoke with at DME Agency: Andrea [name] HH Arranged: PT [place]: [place] Date [place]: 03/02/21 Time [place]: [phone] Representative spoke with at [place]: [place] Arrangements/Services Living arrangements for the past 2 months: [place] Lives with:: Spouse Patient language and need for interpreter reviewed:: Yes Do you feel safe going back to the place where you live?: Yes      Need for Family  Participation in Patient Care: Yes (Comment) Care giver support system in place?: Yes (comment)   Criminal Activity/Legal Involvement Pertinent to Current Situation/Hospitalization: No - Comment as needed  Activities of Daily Living      Permission Sought/Granted Permission sought to share information with : Case Manager       Permission granted to share info w AGENCY: [facility]        Emotional Assessment       Orientation: : Oriented to Self, Oriented to Place, Oriented to Situation (forgetful) Alcohol / Substance Use: Not Applicable Psych Involvement: No (comment)  Admission diagnosis:  Seizure ([place]) [R56.9] Seizures ([place]) [R56.9] Unresponsive [R41.89] Patient Active Problem List   Diagnosis Date Noted   Seizure ([place]) 03/06/2021   Unresponsive    Hypertensive urgency    Acute respiratory failure with hypoxia ([place])    Hypokalemia    PCP:  Celene Squibb, MD Pharmacy:   [facility] [place] [place] [address] Phone: [phone] Fax: [phone]     Social Determinants of Health (SDOH) Interventions    Readmission Risk Interventions No flowsheet data found.

## 2021-03-09 NOTE — Progress Notes (Signed)
PROGRESS NOTE    Andrea [name]  BLT:[id number] DOB: [birth year] DOA: 03/06/2021 PCP: Celene Squibb, MD   Brief Narrative: Andrea [name] is a 75 y.o. female with a history of hypertension. Patient [place] with concern for seizure activity. Patient was admitted to the ICU on admission. LP obtained and MRI significant for multifocal ring enhancing lesions. Keppra initiated with improvement of seizure activity. Blood culture significant for moraxella. Plan for brain biopsy.   Assessment & Plan:   Principal Problem:   Seizure ([place]) Active Problems:   Gram-negative bacteremia   Leukocytosis   Brain abscess   Brain lesion   Seizure In setting of structural brain lesions. Neurology consulted. LP obtained and initial cell count not consistent with infection. Cytology without malignant cells. CSF fluid culture without organism growth. 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  Neurosurgery consulted and plan biopsy on 7/12.  Moraxella bacteremia 1/4 samples positive. Patient was empirically started on meropenem IV. ID consulted and have transitioned her to Ceftriaxone IV/Flagyl. Repeat blood cultures pending.  Hypertensive urgency Patient is on lisinopril-hydrochlorothiazide as an outpatient. Lisinopril restarted with improvement.    DVT prophylaxis: Heparin Code Status:   Code Status: Full Code Family Communication: Husband at bedside Disposition Plan: Discharge home vs SNF in several days pending specialist recommendations/biopsy results   Consultants:  Neurology Neurosurgery PCCM  Procedures:  EEG (7/6) IMPRESSION: This study is suggestive of moderate diffuse encephalopathy, nonspecific etiology but could be secondary to sedation. No seizures or epileptiform discharges were seen throughout the recording. EEG  (7/6) IMPRESSION: This study is suggestive of cortical dysfunction in right hemisphere likely secondary to underlying structural abnormality as well as moderate diffuse encephalopathy, nonspecific etiology but could be secondary to sedation. After propofol was discontinued, encephalopathy improved. No seizures or epileptiform discharges were seen throughout the recording.  Antimicrobials: Meropenem IV Ceftriaxone IV Flagyl    Subjective: No concerns this morning. Feels well. No headaches.  Objective: Vitals:   03/09/21 0401 03/09/21 0500 03/09/21 0800 03/09/21 1212  BP: (!) 143/75   (!) 153/72  Pulse:    85  Resp: 14   19  Temp: 98 F (36.7 C)  97.8 F (36.6 C) 98.1 F (36.7 C)  TempSrc: Oral  Oral Oral  SpO2: 97%     Weight:  84 kg    Height:       No intake or output data in the 24 hours ending 03/09/21 1558 Filed Weights   03/06/21 1834 03/07/21 0403 03/09/21 0500  Weight: 83.9 kg 83.5 kg 84 kg    Examination:  General exam: Appears calm and comfortable  Respiratory system: Clear to auscultation. Respiratory effort normal. Cardiovascular system: S1 & S2 heard, RRR. No murmurs, rubs, gallops or clicks. Gastrointestinal system: Abdomen is nondistended, soft and nontender. No organomegaly or masses felt. Normal bowel sounds heard. Central nervous system: Alert and oriented. No focal neurological deficits. Musculoskeletal: No edema. No calf tenderness Skin: No cyanosis. No rashes Psychiatry: Judgement and insight appear normal. Mood & affect appropriate.     Data Reviewed: I have personally reviewed following labs and imaging studies  CBC Lab Results  Component Value Date   WBC 14.0 (H) 03/07/2021   RBC 3.77 (L) 03/07/2021   HGB 12.0 03/07/2021   HCT 37.2 03/07/2021   MCV 98.7 03/07/2021   MCH 31.8 03/07/2021   PLT 206 03/07/2021  MCHC 32.3 03/07/2021   RDW 13.8 03/07/2021   LYMPHSABS 2.4 03/07/2021   MONOABS 0.9 03/07/2021   EOSABS 0.0 03/07/2021    BASOSABS 0.0 35/36/1443     Last metabolic panel Lab Results  Component Value Date   NA 136 03/07/2021   K 4.3 03/07/2021   CL 108 03/07/2021   CO2 21 (L) 03/07/2021   BUN 12 03/07/2021   CREATININE 0.67 03/07/2021   GLUCOSE 97 03/07/2021   GFRNONAA >60 03/07/2021   CALCIUM 8.4 (L) 03/07/2021   PHOS 2.1 (L) 03/07/2021   PROT 6.4 (L) 03/07/2021   ALBUMIN 3.3 (L) 03/07/2021   BILITOT 0.9 03/07/2021   ALKPHOS 57 03/07/2021   AST 35 03/07/2021   ALT 24 03/07/2021   ANIONGAP 7 03/07/2021    CBG (last 3)  Recent Labs    03/09/21 0354 03/09/21 0805 03/09/21 1216  GLUCAP 99 111* 114*     GFR: Estimated Creatinine Clearance: 66.4 mL/min (by C-G formula based on SCr of 0.67 mg/dL).  Coagulation Profile: Recent Labs  Lab 03/06/21 1802  INR 1.1    Recent Results (from the past 240 hour(s))  SARS CORONAVIRUS 2 (TAT 6-24 HRS) Nasopharyngeal Nasopharyngeal Swab     Status: None   Collection Time: 03/06/21  6:54 PM   Specimen: Nasopharyngeal Swab  Result Value Ref Range Status   SARS Coronavirus 2 NEGATIVE NEGATIVE Final    Comment: (NOTE) SARS-CoV-2 target nucleic acids are NOT DETECTED.  The SARS-CoV-2 RNA is generally detectable in upper and lower respiratory specimens during the acute phase of infection. Negative results do not preclude SARS-CoV-2 infection, do not rule out co-infections with other pathogens, and should not be used as the sole basis for treatment or other patient management decisions. Negative results must be combined with clinical observations, patient history, and epidemiological information. The expected result is Negative.  Fact Sheet for Patients: SugarRoll.be  Fact Sheet for Healthcare Providers: https://www.woods-mathews.com/  This test is not yet approved or cleared by the [place] FDA and  has been authorized for detection and/or diagnosis of SARS-CoV-2 by FDA under an Emergency Use  Authorization (EUA). This EUA will remain  in effect (meaning this test can be used) for the duration of the COVID-19 declaration under Se ction 564(b)(1) of the Act, 21 U.S.C. section 360bbb-3(b)(1), unless the authorization is terminated or revoked sooner.  Performed at [facility], [place] [address] [place], [place] [address]   Urine culture     Status: None   Collection Time: 03/06/21  7:32 PM   Specimen: Urine, Random  Result Value Ref Range Status   Specimen Description URINE, RANDOM  Final   Special Requests NONE  Final   Culture   Final    NO GROWTH Performed at [facility], [place] [address], [place], [place] [address]    Report Status 03/08/2021 FINAL  Final  MRSA Next Gen by PCR, Nasal     Status: None   Collection Time: 03/06/21  9:50 PM   Specimen: Nasal Mucosa; Nasal Swab  Result Value Ref Range Status   MRSA by PCR Next Gen NOT DETECTED NOT DETECTED Final    Comment: (NOTE) The GeneXpert MRSA Assay (FDA approved for NASAL specimens only), is one component of a comprehensive MRSA colonization surveillance program. It is not intended to diagnose MRSA infection nor to guide or monitor treatment for MRSA infections. Test performance is not FDA approved in patients less than 50 years old. Performed at [facility]  [facility], [place] [address] [place], [place] [address]   Culture, blood (routine x 2)     Status: None (Preliminary result)   Collection Time: 03/06/21 10:35 PM   Specimen: BLOOD LEFT WRIST  Result Value Ref Range Status   Specimen Description BLOOD LEFT WRIST  Final   Special Requests   Final    BOTTLES DRAWN AEROBIC ONLY Blood Culture results may not be optimal due to an inadequate volume of blood received in culture bottles   Culture   Final    NO GROWTH 2 DAYS Performed at [facility], [place] [address] [place], [place] [address]    Report Status PENDING  Incomplete  Culture, blood (routine x 2)     Status: None (Preliminary result)    Collection Time: 03/06/21 10:40 PM   Specimen: BLOOD RIGHT WRIST  Result Value Ref Range Status   Specimen Description BLOOD RIGHT WRIST  Final   Special Requests   Final    BOTTLES DRAWN AEROBIC ONLY Blood Culture results may not be optimal due to an inadequate volume of blood received in culture bottles   Culture   Final    NO GROWTH 2 DAYS Performed at [facility], [place] [address], [place], [place] [address]    Report Status PENDING  Incomplete  Culture, blood (routine x 2)     Status: Abnormal   Collection Time: 03/07/21 10:18 AM   Specimen: BLOOD LEFT ARM  Result Value Ref Range Status   Specimen Description BLOOD LEFT ARM  Final   Special Requests   Final    BOTTLES DRAWN AEROBIC ONLY Blood Culture results may not be optimal due to an inadequate volume of blood received in culture bottles   Culture  Setup Time   Final    GRAM NEGATIVE RODS AEROBIC BOTTLE ONLY Organism ID to follow CRITICAL RESULT CALLED TO, READ BACK BY AND VERIFIED WITH: G. BARR PHARMD, AT 0126 03/09/21 D. Victoriano Lain    Culture (A)  Final    MORAXELLA SPECIES BETA LACTAMASE POSITIVE Performed at [facility], [place] [address], [place], [place] [address]    Report Status 03/09/2021 FINAL  Final  Blood Culture ID Panel (Reflexed)     Status: None   Collection Time: 03/07/21 10:18 AM  Result Value Ref Range Status   Enterococcus faecalis NOT DETECTED NOT DETECTED Final   Enterococcus Faecium NOT DETECTED NOT DETECTED Final   Listeria monocytogenes NOT DETECTED NOT DETECTED Final   Staphylococcus species NOT DETECTED NOT DETECTED Final   Staphylococcus aureus (BCID) NOT DETECTED NOT DETECTED Final   Staphylococcus epidermidis NOT DETECTED NOT DETECTED Final   Staphylococcus lugdunensis NOT DETECTED NOT DETECTED Final   Streptococcus species NOT DETECTED NOT DETECTED Final   Streptococcus agalactiae NOT DETECTED NOT DETECTED Final   Streptococcus pneumoniae NOT DETECTED NOT DETECTED Final    Streptococcus pyogenes NOT DETECTED NOT DETECTED Final   A.calcoaceticus-baumannii NOT DETECTED NOT DETECTED Final   Bacteroides fragilis NOT DETECTED NOT DETECTED Final   Enterobacterales NOT DETECTED NOT DETECTED Final   Enterobacter cloacae complex NOT DETECTED NOT DETECTED Final   Escherichia coli NOT DETECTED NOT DETECTED Final   Klebsiella aerogenes NOT DETECTED NOT DETECTED Final   Klebsiella oxytoca NOT DETECTED NOT DETECTED Final   Klebsiella pneumoniae NOT DETECTED NOT DETECTED Final   Proteus species NOT DETECTED NOT DETECTED Final   Salmonella species NOT DETECTED NOT DETECTED Final   Serratia marcescens NOT DETECTED NOT DETECTED  Final   Haemophilus influenzae NOT DETECTED NOT DETECTED Final   Neisseria meningitidis NOT DETECTED NOT DETECTED Final   Pseudomonas aeruginosa NOT DETECTED NOT DETECTED Final   Stenotrophomonas maltophilia NOT DETECTED NOT DETECTED Final   Candida albicans NOT DETECTED NOT DETECTED Final   Candida auris NOT DETECTED NOT DETECTED Final   Candida glabrata NOT DETECTED NOT DETECTED Final   Candida krusei NOT DETECTED NOT DETECTED Final   Candida parapsilosis NOT DETECTED NOT DETECTED Final   Candida tropicalis NOT DETECTED NOT DETECTED Final   Cryptococcus neoformans/gattii NOT DETECTED NOT DETECTED Final    Comment: Performed at [facility], [place] [address] [place], [place] [address]  CSF culture w Gram Stain     Status: None (Preliminary result)   Collection Time: 03/07/21 10:25 AM   Specimen: CSF; Cerebrospinal Fluid  Result Value Ref Range Status   Specimen Description CSF  Final   Special Requests LUMBAR PUNCTURE  Final   Gram Stain   Final    WBC PRESENT, PREDOMINANTLY MONONUCLEAR NO ORGANISMS SEEN CYTOSPIN SMEAR    Culture   Final    NO GROWTH 2 DAYS Performed at [facility], [place] [address], [place], [place] [address]    Report Status PENDING  Incomplete  Culture, fungus without smear     Status: None (Preliminary  result)   Collection Time: 03/07/21 10:25 AM   Specimen: CSF; Cerebrospinal Fluid  Result Value Ref Range Status   Specimen Description CSF  Final   Special Requests LUMBAR PUNCTURE  Final   Culture   Final    NO FUNGUS ISOLATED AFTER 2 DAYS Performed at [facility], [place] [address], [place], [place] [address]    Report Status PENDING  Incomplete  Culture, blood (routine x 2)     Status: None (Preliminary result)   Collection Time: 03/07/21 11:09 AM   Specimen: BLOOD LEFT WRIST  Result Value Ref Range Status   Specimen Description BLOOD LEFT WRIST  Final   Special Requests   Final    BOTTLES DRAWN AEROBIC ONLY Blood Culture results may not be optimal due to an inadequate volume of blood received in culture bottles   Culture   Final    NO GROWTH 2 DAYS Performed at [facility], [place] [address], [place], [place] [address]    Report Status PENDING  Incomplete        Radiology Studies: US Abdomen Complete  Result Date: 03/08/2021 CLINICAL DATA:  Nausea vomiting. EXAM: ABDOMEN ULTRASOUND COMPLETE COMPARISON:  No prior. FINDINGS: Gallbladder: Limited evaluation due to patient's body habitus. 6 mm mobile nonshadowing echo density consistent with nonshadowing stone or tumefactive sludge. Gallbladder wall thickness normal. Negative Murphy sign. Common bile duct: Diameter: 3 mm Liver: Increased hepatic echogenicity consistent with fatty infiltration and or hepatocellular disease. Slightly nodular contour noted about the left hepatic lobe. Possibility of cirrhosis should be considered. No focal hepatic abnormality identified. Portal vein is patent on color Doppler imaging with normal direction of blood flow towards the liver. IVC: No abnormality visualized. Pancreas: Visualized portion unremarkable. Spleen: Size and appearance within normal limits. Right Kidney: Length: 10.8 cm. Echogenicity within normal limits. No mass or hydronephrosis visualized. Left Kidney: Length: 11.3 cm.  Echogenicity within normal limits. 4.5 cm simple cyst. No hydronephrosis visualized. Abdominal aorta: No aneurysm visualized. Other findings: Right pleural effusion incidentally noted. IMPRESSION: 1. Limited evaluation due to patient's body habitus. 6 mm mobile nonshadowing echo density consistent with nonshadowing  gallstone or tumefactive sludge noted within the gallbladder. No evidence of cholecystitis. No biliary distention. 2. Increased hepatic echogenicity consistent with fatty infiltration or hepatocellular disease. Slightly nodular hepatic contour suggesting the possibility of cirrhosis. No focal hepatic abnormality identified. 3.  4.5 cm simple left renal cyst. 4.  Right pleural effusion incidentally noted. Electronically Signed   By: Marcello Moores  Register   On: 03/08/2021 13:36        Scheduled Meds:  chlorhexidine gluconate (MEDLINE KIT)  15 mL Mouth Rinse BID   Chlorhexidine Gluconate Cloth  6 each Topical Daily   docusate  100 mg Oral BID   heparin  5,000 Units Subcutaneous Q8H   insulin aspart  0-15 Units Subcutaneous Q4H   levETIRAcetam  1,000 mg Oral BID   lisinopril  5 mg Oral Daily   mouth rinse  15 mL Mouth Rinse BID   pantoprazole sodium  40 mg Oral QHS   polyethylene glycol  17 g Per Tube Daily   sodium chloride flush  3 mL Intravenous Once   Continuous Infusions:  meropenem (MERREM) IV 2 g (03/09/21 0355)     LOS: 3 days     Cordelia Poche, MD [facility] 03/09/2021, 3:58 PM  If 7PM-7AM, please contact night-coverage [email or web address]

## 2021-03-09 NOTE — Evaluation (Addendum)
Occupational Therapy Evaluation Patient Details Name: Andrea [name] MRN: [id number] DOB: 09/08/1945 Today's Date: 03/09/2021    History of Present Illness Pt is a 75 y.o. female who presented 7/5 with seizure like activity and L-sided weakness. ETT 7/5 - 7/6. EEG suggestive of moderate diffuse encephalopathy, nonspecific etiology but could be secondary to sedation. 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 S/p LP 7/6 with unremarkable CSF. Plan for possible biopsy 7/12. PMH: HTN.   Clinical Impression   Pt was independent prior to admission. Presents with impaired cognition, decreased standing balance and impaired vision which needs further evaluation.  Pt requires min assist for OOB(hand held assist) and min assist for ADL. Pt with dizziness upon initially sitting which resolved within a few minutes, but recurred with vision assessment. Educated on use of a visual target. Pt pleasant and eager to wash her hair to get EEG glue out, MD notified. Will follow acutely.     Follow Up Recommendations  Home health OT;Supervision/Assistance - 24 hour    Equipment Recommendations  3 in 1 bedside commode    Recommendations for Other Services       Precautions / Restrictions Precautions Precautions: Fall Precaution Comments:      Mobility Bed Mobility Overal bed mobility: Needs Assistance Bed Mobility: Supine to Sit;Sit to Supine     Supine to sit: Supervision;HOB elevated Sit to supine: Supervision   General bed mobility comments: supervision for safety and lines    Transfers Overall transfer level: Needs assistance Equipment used: 1 person hand held assist Transfers: Sit to/from Stand Sit to Stand: Min  assist         General transfer comment: steadying assist, cues to focus on target with eyes    Balance Overall balance assessment: Needs assistance   Sitting balance-Leahy Scale: Good Sitting balance - Comments: no LOB donning socks, dizzy initially upon sitting up   Standing balance support: No upper extremity supported;During functional activity Standing balance-Leahy Scale: Fair Standing balance comment: fair standing at sink, min assist for dynamic balance                           ADL either performed or assessed with clinical judgement   ADL Overall ADL's : Needs assistance/impaired Eating/Feeding: Minimal assistance;Sitting Eating/Feeding Details (indicate cue type and reason): assist to set up tray Grooming: Wash/dry hands;Standing;Minimal assistance Grooming Details (indicate cue type and reason): verbal cues to sequence and locate soap dispenser Upper Body Bathing: Minimal assistance;Sitting   Lower Body Bathing: Minimal assistance;Sit to/from stand   Upper Body Dressing : Sitting;Moderate assistance Upper Body Dressing Details (indicate cue type and reason): inability to orient gown Lower Body Dressing: Minimal assistance;Sit to/from stand Lower Body Dressing Details (indicate cue type and reason): donned socks at EOB Toilet Transfer: Minimal assistance;Ambulation Toilet Transfer Details (indicate cue type and reason): hand held assist Toileting- Clothing Manipulation and Hygiene: Minimal assistance       Functional mobility during ADLs: Minimal assistance       Vision Baseline Vision/History: Wears glasses Wears Glasses: At all times Patient Visual Report: Blurring of vision, difficulty tolerating scanning activities, increased her dizziness       Perception     Praxis      Pertinent Vitals/Pain Pain Assessment:  No/denies pain     Hand Dominance Right   Extremity/Trunk Assessment Upper Extremity Assessment Upper Extremity  Assessment: Overall WFL for tasks assessed (B UEs MMT equal)   Lower Extremity Assessment Lower Extremity Assessment: Defer to PT evaluation   Cervical / Trunk Assessment Cervical / Trunk Assessment: Normal   Communication Communication Communication: No difficulties   Cognition Arousal/Alertness: Awake/alert Behavior During Therapy: WFL for tasks assessed/performed Overall Cognitive Status: Impaired/Different from baseline Area of Impairment: Orientation;Memory;Following commands;Problem solving;Awareness;Safety/judgement                 Orientation Level: Disoriented to;Time;Situation (repeatedly asking)   Memory: Decreased short-term memory Following Commands: Follows multi-step commands inconsistently;Follows one step commands with increased time Safety/Judgement: Decreased awareness of deficits;Decreased awareness of safety Awareness: Intellectual Problem Solving: Slow processing;Difficulty sequencing;Requires verbal cues General Comments: pt cold, had not called nursing for assistance, did not recall how to use button   General Comments       Exercises     Shoulder Instructions      Home Living Family/patient expects to be discharged to:: Private residence Living Arrangements: Spouse/significant other Available Help at Discharge: Family;Available 24 hours/day Type of Home: House Home Access: Stairs to enter [employer] of Steps: 5 Entrance Stairs-Rails: Right;Left Home Layout: One level;Laundry or work area in basement     [employer] Shower/Tub: Teacher, early years/pre: [employer]: None          Prior Functioning/Environment Level of Independence: Independent        Comments: drives, likes to quilt        OT Problem List: Impaired balance (sitting and/or standing);Decreased knowledge of use of DME or AE;Decreased cognition;Decreased safety awareness      OT Treatment/Interventions: Self-care/ADL  training;DME and/or AE instruction;Patient/family education;Balance training;Therapeutic activities;Cognitive remediation/compensation;Visual/perceptual remediation/compensation    OT Goals(Current goals can be found in the care plan section) Acute Rehab OT Goals Patient Stated Goal: to get EEG gel out of her hair OT Goal Formulation: With patient Time For Goal Achievement: 03/23/21 Potential to Achieve Goals: Good  OT Frequency: Min 2X/week   Barriers to D/C:            Co-evaluation              AM-PAC OT "6 Clicks" Daily Activity     Outcome Measure Help from another person eating meals?: A Little Help from another person taking care of personal grooming?: A Little Help from another person toileting, which includes using toliet, bedpan, or urinal?: A Little Help from another person bathing (including washing, rinsing, drying)?: A Little Help from another person to put on and taking off regular upper body clothing?: A Lot Help from another person to put on and taking off regular lower body clothing?: A Little 6 Click Score: 17   End of Session Equipment Utilized During Treatment: Gait belt  Activity Tolerance: Patient tolerated treatment well Patient left: in bed;with call bell/phone within reach;with bed alarm set  OT Visit Diagnosis: Unsteadiness on feet (R26.81);Other abnormalities of gait and mobility (R26.89);Other symptoms and signs involving cognitive function;Low vision, both eyes (H54.2)                Time: 3220-2542 OT Time Calculation (min): 33 min Charges:  OT General Charges $OT Visit: 1 Visit OT Evaluation $OT Eval Moderate Complexity: 1 Mod OT Treatments $Self Care/Home Management : 8-22 mins  Nestor Lewandowsky, OTR/L Acute Rehabilitation Services Pager: [phone] Office: [phone]  Andrea [name] 03/09/2021, 11:32 AM

## 2021-03-10 DIAGNOSIS — R7881 Bacteremia: Secondary | ICD-10-CM | POA: Diagnosis not present

## 2021-03-10 LAB — GLUCOSE, CAPILLARY
Glucose-Capillary: 108 mg/dL — ABNORMAL HIGH (ref 70–99)
Glucose-Capillary: 101 mg/dL — ABNORMAL HIGH (ref 70–99)
Glucose-Capillary: 151 mg/dL — ABNORMAL HIGH (ref 70–99)
Glucose-Capillary: 112 mg/dL — ABNORMAL HIGH (ref 70–99)
Glucose-Capillary: 156 mg/dL — ABNORMAL HIGH (ref 70–99)

## 2021-03-10 LAB — CSF CULTURE W GRAM STAIN: Culture: NO GROWTH

## 2021-03-10 NOTE — Progress Notes (Signed)
PROGRESS NOTE    Andrea [name]  GBE:[id number] DOB: 01/23/1946 DOA: 03/06/2021 PCP: Celene Squibb, MD   Brief Narrative: Andrea [name] is a 75 y.o. female with a history of hypertension. [place] with concern for seizure activity. Patient was admitted to the ICU on admission. LP obtained and MRI significant for multifocal ring enhancing lesions. Keppra initiated with improvement of seizure activity. Blood culture significant for moraxella. Plan for brain biopsy.   Assessment & Plan:   Principal Problem:   Seizure ([place]) Active Problems:   Gram-negative bacteremia   Leukocytosis   Brain abscess   Brain lesion   Seizure In setting of structural brain lesions. Neurology consulted. LP obtained and initial cell count not consistent with infection. Cytology without malignant cells. CSF fluid culture without organism growth. 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  Neurosurgery consulted and plan biopsy on 7/12.  Moraxella bacteremia 1/4 samples positive. Patient was empirically started on meropenem IV. ID consulted and have transitioned her to Ceftriaxone IV/Flagyl. Repeat blood cultures pending. -ID recommendations: Ceftriaxone/Flagyl, repeat blood cultures (7/8)  Hypertensive urgency Patient is on lisinopril-hydrochlorothiazide as an outpatient. Lisinopril restarted with improvement.    DVT prophylaxis: Heparin Code Status:   Code Status: Full Code Family Communication: None at bedside Disposition Plan: Discharge home vs SNF in several days pending specialist recommendations/biopsy results   Consultants:  Neurology Neurosurgery PCCM  Procedures:  EEG (7/6) IMPRESSION: This study is suggestive of moderate diffuse encephalopathy, nonspecific etiology but could be secondary to sedation. No seizures or epileptiform  discharges were seen throughout the recording. EEG (7/6) IMPRESSION: This study is suggestive of cortical dysfunction in right hemisphere likely secondary to underlying structural abnormality as well as moderate diffuse encephalopathy, nonspecific etiology but could be secondary to sedation. After propofol was discontinued, encephalopathy improved. No seizures or epileptiform discharges were seen throughout the recording.  Antimicrobials: Meropenem IV Ceftriaxone IV Flagyl    Subjective: No issues this morning. No dyspnea. Some memory issues  Objective: Vitals:   03/10/21 0415 03/10/21 0447 03/10/21 0748 03/10/21 1157  BP: (!) 155/84  (!) 166/78 132/76  Pulse: 90     Resp: 16     Temp: 98 F (36.7 C)  98.1 F (36.7 C) 97.9 F (36.6 C)  TempSrc: Axillary  Oral Oral  SpO2: 94%     Weight:  84.1 kg    Height:       No intake or output data in the 24 hours ending 03/10/21 1229 Filed Weights   03/07/21 0403 03/09/21 0500 03/10/21 0447  Weight: 83.5 kg 84 kg 84.1 kg    Examination:  General exam: Appears calm and comfortable Respiratory system: Clear to auscultation. Respiratory effort normal. Cardiovascular system: S1 & S2 heard, RRR. No murmurs, rubs, gallops or clicks. Gastrointestinal system: Abdomen is nondistended, soft and nontender. No organomegaly or masses felt. Normal bowel sounds heard. Central nervous system: Alert and oriented to person/place. No focal neurological deficits. Musculoskeletal: No edema. No calf tenderness Skin: No cyanosis. No rashes Psychiatry: Judgement and insight appear normal. Mood & affect appropriate.    Data Reviewed: I have personally reviewed following labs and imaging studies  CBC Lab Results  Component Value Date   WBC 14.0 (H) 03/07/2021   RBC 3.77 (L) 03/07/2021   HGB 12.0 03/07/2021   HCT 37.2 03/07/2021   MCV 98.7 03/07/2021  MCH 31.8 03/07/2021   PLT 206 03/07/2021   MCHC 32.3 03/07/2021   RDW 13.8 03/07/2021    LYMPHSABS 2.4 03/07/2021   MONOABS 0.9 03/07/2021   EOSABS 0.0 03/07/2021   BASOSABS 0.0 38/38/1840     Last metabolic panel Lab Results  Component Value Date   NA 136 03/07/2021   K 4.3 03/07/2021   CL 108 03/07/2021   CO2 21 (L) 03/07/2021   BUN 12 03/07/2021   CREATININE 0.67 03/07/2021   GLUCOSE 97 03/07/2021   GFRNONAA >60 03/07/2021   CALCIUM 8.4 (L) 03/07/2021   PHOS 2.1 (L) 03/07/2021   PROT 6.4 (L) 03/07/2021   ALBUMIN 3.3 (L) 03/07/2021   BILITOT 0.9 03/07/2021   ALKPHOS 57 03/07/2021   AST 35 03/07/2021   ALT 24 03/07/2021   ANIONGAP 7 03/07/2021    CBG (last 3)  Recent Labs    03/10/21 0414 03/10/21 0750 03/10/21 1159  GLUCAP 108* 101* 151*      GFR: Estimated Creatinine Clearance: 66.4 mL/min (by C-G formula based on SCr of 0.67 mg/dL).  Coagulation Profile: Recent Labs  Lab 03/06/21 1802  INR 1.1     Recent Results (from the past 240 hour(s))  SARS CORONAVIRUS 2 (TAT 6-24 HRS) Nasopharyngeal Nasopharyngeal Swab     Status: None   Collection Time: 03/06/21  6:54 PM   Specimen: Nasopharyngeal Swab  Result Value Ref Range Status   SARS Coronavirus 2 NEGATIVE NEGATIVE Final    Comment: (NOTE) SARS-CoV-2 target nucleic acids are NOT DETECTED.  The SARS-CoV-2 RNA is generally detectable in upper and lower respiratory specimens during the acute phase of infection. Negative results do not preclude SARS-CoV-2 infection, do not rule out co-infections with other pathogens, and should not be used as the sole basis for treatment or other patient management decisions. Negative results must be combined with clinical observations, patient history, and epidemiological information. The expected result is Negative.  Fact Sheet for Patients: SugarRoll.be  Fact Sheet for Healthcare Providers: https://www.woods-mathews.com/  This test is not yet approved or cleared by the [place] FDA and  has been  authorized for detection and/or diagnosis of SARS-CoV-2 by FDA under an Emergency Use Authorization (EUA). This EUA will remain  in effect (meaning this test can be used) for the duration of the COVID-19 declaration under Se ction 564(b)(1) of the Act, 21 U.S.C. section 360bbb-3(b)(1), unless the authorization is terminated or revoked sooner.  Performed at [facility], [place] [address], [place], [place] [address]   Urine culture     Status: None   Collection Time: 03/06/21  7:32 PM   Specimen: Urine, Random  Result Value Ref Range Status   Specimen Description URINE, RANDOM  Final   Special Requests NONE  Final   Culture   Final    NO GROWTH Performed at [facility], [place] [address], [place], [place] [address]    Report Status 03/08/2021 FINAL  Final  MRSA Next Gen by PCR, Nasal     Status: None   Collection Time: 03/06/21  9:50 PM   Specimen: Nasal Mucosa; Nasal Swab  Result Value Ref Range Status   MRSA by PCR Next Gen NOT DETECTED NOT DETECTED Final    Comment: (NOTE) The GeneXpert MRSA Assay (FDA approved for NASAL specimens only), is one component of a comprehensive MRSA colonization surveillance program. It is not intended to diagnose MRSA infection nor to guide or monitor treatment for MRSA infections. Test performance is not  FDA approved in patients less than 65 years old. Performed at [facility], [place] [address] [place], [place] [address]   Culture, blood (routine x 2)     Status: None (Preliminary result)   Collection Time: 03/06/21 10:35 PM   Specimen: BLOOD LEFT WRIST  Result Value Ref Range Status   Specimen Description BLOOD LEFT WRIST  Final   Special Requests   Final    BOTTLES DRAWN AEROBIC ONLY Blood Culture results may not be optimal due to an inadequate volume of blood received in culture bottles   Culture   Final    NO GROWTH 3 DAYS Performed at [facility], [place] [address] [place], [place] [address]    Report Status PENDING   Incomplete  Culture, blood (routine x 2)     Status: None (Preliminary result)   Collection Time: 03/06/21 10:40 PM   Specimen: BLOOD RIGHT WRIST  Result Value Ref Range Status   Specimen Description BLOOD RIGHT WRIST  Final   Special Requests   Final    BOTTLES DRAWN AEROBIC ONLY Blood Culture results may not be optimal due to an inadequate volume of blood received in culture bottles   Culture   Final    NO GROWTH 3 DAYS Performed at [facility], [place] [address] [place], [place] [address]    Report Status PENDING  Incomplete  Culture, blood (routine x 2)     Status: Abnormal   Collection Time: 03/07/21 10:18 AM   Specimen: BLOOD LEFT ARM  Result Value Ref Range Status   Specimen Description BLOOD LEFT ARM  Final   Special Requests   Final    BOTTLES DRAWN AEROBIC ONLY Blood Culture results may not be optimal due to an inadequate volume of blood received in culture bottles   Culture  Setup Time   Final    GRAM NEGATIVE RODS AEROBIC BOTTLE ONLY Organism ID to follow CRITICAL RESULT CALLED TO, READ BACK BY AND VERIFIED WITH: G. BARR PHARMD, AT 0126 03/09/21 D. Victoriano Lain    Culture (A)  Final    MORAXELLA SPECIES BETA LACTAMASE POSITIVE Performed at [facility], [place] [address], [place], [place] [address]    Report Status 03/09/2021 FINAL  Final  Blood Culture ID Panel (Reflexed)     Status: None   Collection Time: 03/07/21 10:18 AM  Result Value Ref Range Status   Enterococcus faecalis NOT DETECTED NOT DETECTED Final   Enterococcus Faecium NOT DETECTED NOT DETECTED Final   Listeria monocytogenes NOT DETECTED NOT DETECTED Final   Staphylococcus species NOT DETECTED NOT DETECTED Final   Staphylococcus aureus (BCID) NOT DETECTED NOT DETECTED Final   Staphylococcus epidermidis NOT DETECTED NOT DETECTED Final   Staphylococcus lugdunensis NOT DETECTED NOT DETECTED Final   Streptococcus species NOT DETECTED NOT DETECTED Final   Streptococcus agalactiae NOT DETECTED NOT  DETECTED Final   Streptococcus pneumoniae NOT DETECTED NOT DETECTED Final   Streptococcus pyogenes NOT DETECTED NOT DETECTED Final   A.calcoaceticus-baumannii NOT DETECTED NOT DETECTED Final   Bacteroides fragilis NOT DETECTED NOT DETECTED Final   Enterobacterales NOT DETECTED NOT DETECTED Final   Enterobacter cloacae complex NOT DETECTED NOT DETECTED Final   Escherichia coli NOT DETECTED NOT DETECTED Final   Klebsiella aerogenes NOT DETECTED NOT DETECTED Final   Klebsiella oxytoca NOT DETECTED NOT DETECTED Final   Klebsiella pneumoniae NOT DETECTED NOT DETECTED Final   Proteus species NOT DETECTED NOT DETECTED Final   Salmonella species NOT  DETECTED NOT DETECTED Final   Serratia marcescens NOT DETECTED NOT DETECTED Final   Haemophilus influenzae NOT DETECTED NOT DETECTED Final   Neisseria meningitidis NOT DETECTED NOT DETECTED Final   Pseudomonas aeruginosa NOT DETECTED NOT DETECTED Final   Stenotrophomonas maltophilia NOT DETECTED NOT DETECTED Final   Candida albicans NOT DETECTED NOT DETECTED Final   Candida auris NOT DETECTED NOT DETECTED Final   Candida glabrata NOT DETECTED NOT DETECTED Final   Candida krusei NOT DETECTED NOT DETECTED Final   Candida parapsilosis NOT DETECTED NOT DETECTED Final   Candida tropicalis NOT DETECTED NOT DETECTED Final   Cryptococcus neoformans/gattii NOT DETECTED NOT DETECTED Final    Comment: Performed at [facility], [place] [address], [place], [place] [address]  CSF culture w Gram Stain     Status: None (Preliminary result)   Collection Time: 03/07/21 10:25 AM   Specimen: CSF; Cerebrospinal Fluid  Result Value Ref Range Status   Specimen Description CSF  Final   Special Requests LUMBAR PUNCTURE  Final   Gram Stain   Final    WBC PRESENT, PREDOMINANTLY MONONUCLEAR NO ORGANISMS SEEN CYTOSPIN SMEAR    Culture   Final    NO GROWTH 3 DAYS Performed at [facility], [place] [address] [place], [place] [address]    Report Status PENDING   Incomplete  Culture, fungus without smear     Status: None (Preliminary result)   Collection Time: 03/07/21 10:25 AM   Specimen: CSF; Cerebrospinal Fluid  Result Value Ref Range Status   Specimen Description CSF  Final   Special Requests LUMBAR PUNCTURE  Final   Culture   Final    NO FUNGUS ISOLATED AFTER 3 DAYS Performed at [facility], [place] [address] [place], [place] [address]    Report Status PENDING  Incomplete  Culture, blood (routine x 2)     Status: None (Preliminary result)   Collection Time: 03/07/21 11:09 AM   Specimen: BLOOD LEFT WRIST  Result Value Ref Range Status   Specimen Description BLOOD LEFT WRIST  Final   Special Requests   Final    BOTTLES DRAWN AEROBIC ONLY Blood Culture results may not be optimal due to an inadequate volume of blood received in culture bottles   Culture   Final    NO GROWTH 3 DAYS Performed at [facility], [place] [address], [place], [place] [address]    Report Status PENDING  Incomplete  Culture, blood (routine x 2)     Status: None (Preliminary result)   Collection Time: 03/09/21  2:45 AM   Specimen: BLOOD LEFT FOREARM  Result Value Ref Range Status   Specimen Description BLOOD LEFT FOREARM  Final   Special Requests   Final    BOTTLES DRAWN AEROBIC AND ANAEROBIC Blood Culture adequate volume   Culture   Final    NO GROWTH 1 DAY Performed at [facility], [place] [address] [place], [place] [address]    Report Status PENDING  Incomplete  Culture, blood (routine x 2)     Status: None (Preliminary result)   Collection Time: 03/09/21  2:55 AM   Specimen: BLOOD LEFT FOREARM  Result Value Ref Range Status   Specimen Description BLOOD LEFT FOREARM  Final   Special Requests   Final    BOTTLES DRAWN AEROBIC AND ANAEROBIC Blood Culture adequate volume   Culture   Final    NO GROWTH 1 DAY Performed at [facility],  [address] [place], [place] [address]    Report Status PENDING  Incomplete         Radiology  Studies: CT STEALTH HEAD W/O CONTRAST  Result Date: 03/09/2021 CLINICAL DATA:  Stereotactic CT for surgical planning. EXAM: CT HEAD WITHOUT CONTRAST TECHNIQUE: Contiguous axial images were obtained from the base of the skull through the vertex without intravenous contrast. COMPARISON:  CT head March 06, 2021.  MRI head March 07, 2021. FINDINGS: Limited surgical planning CT. Known multifocal enhancing lesions were better characterized on recent MRI. No unexpected findings on this study. No evidence of new/interval large vascular territory infarct, acute hemorrhage, midline shift, or hydrocephalus. No acute calvarial fracture. Mild ethmoid air cell mucosal thickening with otherwise clear sinuses. No acute orbital findings. No mastoid effusions. IMPRESSION: Limited treatment planning CT. No unexpected findings. Please see recent MRI for characterization of multiple known enhancing lesions. Electronically Signed   By: Margaretha [place] MD   On: 03/09/2021 19:45        Scheduled Meds:  chlorhexidine gluconate (MEDLINE KIT)  15 mL Mouth Rinse BID   Chlorhexidine Gluconate Cloth  6 each Topical Daily   docusate  100 mg Oral BID   heparin  5,000 Units Subcutaneous Q8H   insulin aspart  0-15 Units Subcutaneous Q4H   levETIRAcetam  1,000 mg Oral BID   lisinopril  5 mg Oral Daily   mouth rinse  15 mL Mouth Rinse BID   pantoprazole sodium  40 mg Oral QHS   polyethylene glycol  17 g Per Tube Daily   sodium chloride flush  3 mL Intravenous Once   Continuous Infusions:  cefTRIAXone (ROCEPHIN)  IV 2 g (03/10/21 3870)   metronidazole 500 mg (03/10/21 1045)     LOS: 4 days     Cordelia Poche, MD [facility] 03/10/2021, 12:29 PM  If 7PM-7AM, please contact night-coverage [email or web address]

## 2021-03-10 NOTE — Progress Notes (Signed)
Assisted pt to shower and wash her hair with RN's approval. Requires min assist to ambulate, mostly to navigate. Sat on 3 in 1 in shower for safety. Pt very grateful to wash her hair.    03/10/21 1500  OT Visit Information  Last OT Received On 03/10/21  Assistance Needed +1  History of Present Illness Pt is a 75 y.o. female who presented 03/06/21 with seizure like activity and L-sided weakness. ETT 7/5 - 7/6. EEG suggestive of moderate diffuse encephalopathy, nonspecific etiology but could be secondary to sedation. 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 S/p LP 7/6 with unremarkable CSF. Plan for possible biopsy 7/12. PMH: HTN.  Precautions  Precautions Fall  Precaution Comments L hemianopia  Pain Assessment  Pain Assessment No/denies pain  Cognition  Arousal/Alertness Awake/alert  Behavior During Therapy WFL for tasks assessed/performed  Overall Cognitive Status Impaired/Different from baseline  Area of Impairment Safety/judgement;Awareness;Problem solving  Memory Decreased short-term memory  Following Commands Follows multi-step commands inconsistently;Follows one step commands with increased time  Safety/Judgement Decreased awareness of deficits;Decreased awareness of safety  Problem Solving Difficulty sequencing;Requires verbal cues  General Comments difficulty navigating small areas to get into shower  ADL  Overall ADL's  Needs assistance/impaired  Grooming Wash/dry hands;Sitting;Set up  Upper Body Bathing Minimal assistance;Sitting  Upper Body Bathing Details (indicate cue type and reason) assisted to wash back  Lower Body Bathing Minimal assistance;Sitting/lateral leans  Upper Body Dressing   Sitting;Minimal assistance  Upper Body Dressing Details (indicate cue type and reason) inability to orient gown  Lower Body Dressing Sit to/from stand;Supervision/safety  Lower Body Dressing Details (indicate cue type and reason) socks and mesh panties  Functional mobility during ADLs Minimal assistance (hand held to guide)  General ADL Comments pt showered and washed her hair seated in 3 in 1  Bed Mobility  General bed mobility comments in chair  Balance  Overall balance assessment Needs assistance  Sitting balance-Leahy Scale Good  Standing balance-Leahy Scale Fair  Transfers  Overall transfer level Needs assistance  Transfers Sit to/from Stand  Sit to Stand Supervision  OT - End of Session  Activity Tolerance Patient tolerated treatment well  Patient left in chair;with call bell/phone within reach;with chair alarm set  OT Assessment/Plan  OT Plan Discharge plan remains appropriate  OT Visit Diagnosis Unsteadiness on feet (R26.81);Other abnormalities of gait and mobility (R26.89);Other symptoms and signs involving cognitive function;Low vision, both eyes (H54.2)  OT Frequency (ACUTE ONLY) Min 2X/week  Follow Up Recommendations Home health OT;Supervision/Assistance - 24 hour  OT Equipment 3 in 1 bedside commode  AM-PAC OT "6 Clicks" Daily Activity Outcome Measure (Version 2)  Help from another person eating meals? 3  Help from another person taking care of personal grooming? 3  Help from another person toileting, which includes using toliet, bedpan, or urinal? 3  Help from another person bathing (including washing, rinsing, drying)? 3  Help from another person to put on and taking off regular upper body clothing? 3  Help from another person to put on and taking off regular lower body clothing? 3  6 Click Score 18  OT Goal Progression  Progress towards OT goals Progressing toward goals  Acute Rehab OT Goals  Patient Stated Goal to go home  OT Goal Formulation With patient  Time  For Goal Achievement 03/23/21  Potential to Achieve Goals Good  OT Time Calculation  OT Start Time (ACUTE ONLY) 1440  OT Stop Time (ACUTE ONLY) 1520  OT Time Calculation (min) 40 min  OT General Charges  $OT Visit 1 Visit  OT Treatments  $Self Care/Home Management  38-52 mins  Nestor Lewandowsky, OTR/L Acute Rehabilitation Services Pager: [phone] Office: [phone]

## 2021-03-10 NOTE — Progress Notes (Signed)
Physical Therapy Treatment Patient Details Name: Andrea [name] MRN: [id number] DOB: 26-Feb-1946 Today's Date: 03/10/2021    History of Present Illness Pt is a 75 y.o. female who presented 03/06/21 with seizure like activity and L-sided weakness. ETT 7/5 - 7/6. EEG suggestive of moderate diffuse encephalopathy, nonspecific etiology but could be secondary to sedation. 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 S/p LP 7/6 with unremarkable CSF. Plan for possible biopsy 7/12. PMH: HTN.    PT Comments    Pt is progressing well with gait and mobility. She was able to walk further down the hallway and practice stairs today with min to min guard assist overall.  She may do just as well with and without the RW given her difficulty with using it safely.  We will continue to assess.  Husband, [name], and grandson, Merrilee Seashore were helpful during today's session and very involved in her care at home. PT will continue to follow acutely for safe mobility progression   Follow Up Recommendations  Home health PT;Supervision/Assistance - 24 hour     Equipment Recommendations  3in1 (PT);Rolling walker with 5" wheels    Recommendations for Other Services       Precautions / Restrictions Precautions Precautions: Fall Precaution Comments: L hemianopia    Mobility  Bed Mobility Overal bed mobility: Needs Assistance Bed Mobility: Supine to Sit     Supine to sit: Supervision;HOB elevated     General bed mobility comments: Supervision for safety, pt using railing and HOB ~35 degrees    Transfers Overall transfer level: Needs assistance Equipment used: Rolling walker (2 wheeled) Transfers: Sit to/from Stand Sit to Stand: Min guard          General transfer comment: Min guard assist for safety, reinforced cues multiple times for safe transitions to and from sitting to standing with RW and vice versa  Ambulation/Gait Ambulation/Gait assistance: Min guard Gait Distance (Feet): 200 Feet Assistive device: Rolling walker (2 wheeled) Gait Pattern/deviations: Step-through pattern;Shuffle;Staggering left;Staggering right;Drifts right/left;Trunk flexed Gait velocity: improved, and much improved on straight aways in the hallway. Gait velocity interpretation: 1.31 - 2.62 ft/sec, indicative of limited community ambulator General Gait Details: Cues for closer proximity to RW, difficulty getting out of tight spaces in room, bathroom and from stairwell into hallway.  She did run into the doorway on the left, but I was not sure it was visual or cognitive deficits making her do that.  She did better around the room without the RW as she needed so much help to use the RW safely.   Stairs Stairs: Yes Stairs assistance: Min guard Stair Management: Two rails;Step to pattern;Forwards Number of Stairs: 4 (that is as far as the IV pole would let us go) General stair comments: Min guard assist for safety on stairs, pt could reach wide bil railings, husband and grandson present for stair training.  Pt did well with step to pattern and support.   Wheelchair Mobility    Modified Rankin (Stroke Patients Only) Modified Rankin (Stroke Patients Only) Pre-Morbid Rankin Score: No symptoms Modified Rankin: Moderately severe disability     Balance Overall balance assessment: Needs assistance Sitting-balance support: No upper extremity supported;Feet supported Sitting balance-Leahy Scale: Good     Standing balance support: Single extremity supported;Bilateral upper extremity supported;No upper extremity supported Standing  balance-Leahy Scale: Fair Standing balance comment: close supervision at sink to wash, belly touching support surface,  yet hands free.                            Cognition Arousal/Alertness: Awake/alert Behavior During Therapy: WFL for tasks assessed/performed Overall Cognitive Status: Impaired/Different from baseline Area of Impairment: Safety/judgement;Awareness;Problem solving                         Safety/Judgement: Decreased awareness of deficits;Decreased awareness of safety Awareness: Intellectual Problem Solving: Difficulty sequencing;Requires verbal cues General Comments: Pt needing cues to navigate tight spaces and to sequence some, not all self care ADLs with PT today.  Had difficulty using the RW and following more complex multi step commands.  Processing speed seems better than previous reports.      Exercises      General Comments        Pertinent Vitals/Pain Pain Assessment: No/denies pain    Home Living                      Prior Function            PT Goals (current goals can now be found in the care plan section) Acute Rehab PT Goals Patient Stated Goal: to go home Progress towards PT goals: Progressing toward goals    Frequency    Min 4X/week      PT Plan Current plan remains appropriate    Co-evaluation              AM-PAC PT "6 Clicks" Mobility   Outcome Measure  Help needed turning from your back to your side while in a flat bed without using bedrails?: A Little Help needed moving from lying on your back to sitting on the side of a flat bed without using bedrails?: A Little Help needed moving to and from a bed to a chair (including a wheelchair)?: A Little Help needed standing up from a chair using your arms (e.g., wheelchair or bedside chair)?: A Little Help needed to walk in hospital room?: A Little Help needed climbing 3-5 steps with a railing? : A Lot 6 Click Score: 17    End of Session Equipment Utilized During Treatment: Gait belt Activity Tolerance: Patient tolerated treatment well Patient left: in  chair;with call bell/phone within reach;with chair alarm set;with family/visitor present   PT Visit Diagnosis: Unsteadiness on feet (R26.81);Other abnormalities of gait and mobility (R26.89);Muscle weakness (generalized) (M62.81);Difficulty in walking, not elsewhere classified (R26.2);Other symptoms and signs involving the nervous system (R29.898);Dizziness and giddiness (R42)     Time: 2633-3545 PT Time Calculation (min) (ACUTE ONLY): 39 min  Charges:  $Gait Training: 23-37 mins $Therapeutic Activity: 8-22 mins                    Verdene Lennert, PT, DPT  Acute Rehabilitation Ortho Tech Supervisor [phone] pager [phone]) [phone] office

## 2021-03-11 DIAGNOSIS — R7881 Bacteremia: Secondary | ICD-10-CM | POA: Diagnosis not present

## 2021-03-11 LAB — BASIC METABOLIC PANEL
Anion gap: 7 (ref 5–15)
BUN: 10 mg/dL (ref 8–23)
CO2: 30 mmol/L (ref 22–32)
Calcium: 9 mg/dL (ref 8.9–10.3)
Chloride: 103 mmol/L (ref 98–111)
Creatinine, Ser: 0.7 mg/dL (ref 0.44–1.00)
GFR, Estimated: >60 mL/min (ref >60)
Glucose, Bld: 93 mg/dL (ref 70–99)
Potassium: 2.7 mmol/L — CL (ref 3.5–5.1)
Sodium: 140 mmol/L (ref 135–145)

## 2021-03-11 LAB — GLUCOSE, CAPILLARY
Glucose-Capillary: 102 mg/dL — ABNORMAL HIGH (ref 70–99)
Glucose-Capillary: 124 mg/dL — ABNORMAL HIGH (ref 70–99)
Glucose-Capillary: 96 mg/dL (ref 70–99)
Glucose-Capillary: 163 mg/dL — ABNORMAL HIGH (ref 70–99)

## 2021-03-11 LAB — CBC
HCT: 35.3 % — ABNORMAL LOW (ref 36.0–46.0)
Hemoglobin: 11.6 g/dL — ABNORMAL LOW (ref 12.0–15.0)
MCH: 31.8 pg (ref 26.0–34.0)
MCHC: 32.9 g/dL (ref 30.0–36.0)
MCV: 96.7 fL (ref 80.0–100.0)
Platelets: 312 10*3/uL (ref 150–400)
RBC: 3.65 MIL/uL — ABNORMAL LOW (ref 3.87–5.11)
RDW: 13.4 % (ref 11.5–15.5)
WBC: 8.8 10*3/uL (ref 4.0–10.5)
nRBC: 0 % (ref 0.0–0.2)

## 2021-03-11 LAB — MAGNESIUM: Magnesium: 1.8 mg/dL (ref 1.7–2.4)

## 2021-03-11 MED ORDER — INSULIN ASPART 100 UNIT/ML IJ SOLN
0.0000 [IU] | Freq: Three times a day (TID) | INTRAMUSCULAR | Status: DC
  Administered 2021-03-12 – 2021-03-15 (×3): 1 [IU] via SUBCUTANEOUS
  Administered 2021-03-16: 2 [IU] via SUBCUTANEOUS
  Administered 2021-03-17: 1 [IU] via SUBCUTANEOUS

## 2021-03-11 MED ORDER — POTASSIUM CHLORIDE CRYS ER 20 MEQ PO TBCR
40.0000 meq | EXTENDED_RELEASE_TABLET | ORAL | Status: AC
  Administered 2021-03-11 (×2): 40 meq via ORAL
  Filled 2021-03-11 (×2): qty 2

## 2021-03-11 NOTE — Progress Notes (Signed)
PROGRESS NOTE    Andrea [name]  EUM:[id number] DOB: [birth year] DOA: 03/06/2021 PCP: Celene Squibb, MD   Brief Narrative: Andrea [name] is a 75 y.o. female with a history of hypertension. [place] concern for seizure activity. Patient was admitted to the ICU on admission. LP obtained and MRI significant for multifocal ring enhancing lesions. Keppra initiated with improvement of seizure activity. Blood culture significant for moraxella. Plan for brain biopsy.   Assessment & Plan:   Principal Problem:   Seizure ([place]) Active Problems:   Gram-negative bacteremia   Leukocytosis   Brain abscess   Brain lesion   Seizure In setting of structural brain lesions. Neurology consulted. LP obtained and initial cell count not consistent with infection. Cytology without malignant cells. CSF fluid culture without organism growth. 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  Neurosurgery consulted and plan biopsy on 7/12.  Moraxella bacteremia 1/4 samples positive. Patient was empirically started on meropenem IV. ID consulted and have transitioned her to Ceftriaxone IV/Flagyl. Repeat blood cultures pending. -ID recommendations: Ceftriaxone/Flagyl, repeat blood cultures (7/8)  Hypertensive urgency Patient is on lisinopril-hydrochlorothiazide as an outpatient. Lisinopril restarted with improvement.  Memory impairment Seems to be related to above. Currently stable.   DVT prophylaxis: Heparin Code Status:   Code Status: Full Code Family Communication: None at bedside Disposition Plan: Discharge home vs SNF in several days pending specialist recommendations/biopsy results   Consultants:  Neurology Neurosurgery PCCM  Procedures:  EEG (7/6) IMPRESSION: This study is suggestive of moderate diffuse encephalopathy, nonspecific etiology but  could be secondary to sedation. No seizures or epileptiform discharges were seen throughout the recording. EEG (7/6) IMPRESSION: This study is suggestive of cortical dysfunction in right hemisphere likely secondary to underlying structural abnormality as well as moderate diffuse encephalopathy, nonspecific etiology but could be secondary to sedation. After propofol was discontinued, encephalopathy improved. No seizures or epileptiform discharges were seen throughout the recording.  Antimicrobials: Meropenem IV Ceftriaxone IV Flagyl    Subjective: Agitated about alarms going off. Did not sleep well last night.  Objective: Vitals:   03/10/21 1923 03/10/21 2314 03/11/21 0331 03/11/21 0733  BP: (!) 155/76 (!) 158/85 (!) 175/81 139/86  Pulse: 87 77 79 87  Resp: _0 Temp: 97.7 F (36.5 C) 97.7 F (36.5 C) 98 F (36.7 C) 98 F (36.7 C)  TempSrc: Oral Oral Oral Oral  SpO2: 94%  96% 100%  Weight:      Height:        Intake/Output Summary (Last 24 hours) at 03/11/2021 0851 Last data filed at 03/10/2021 1600 Gross per 24 hour  Intake 437 ml  Output --  Net 437 ml   Filed Weights   03/07/21 0403 03/09/21 0500 03/10/21 0447  Weight: 83.5 kg 84 kg 84.1 kg    Examination:  General exam: Appears calm and comfortable Respiratory system: Clear to auscultation. Respiratory effort normal. Cardiovascular system: S1 & S2 heard, RRR. No murmurs, rubs, gallops or clicks. Gastrointestinal system: Abdomen is nondistended, soft and nontender. No organomegaly or masses felt. Normal bowel sounds heard. Central nervous system: Alert and oriented x4. No focal neurological deficits. Musculoskeletal: No edema. No calf tenderness Skin: No cyanosis. No rashes Psychiatry: Judgement and insight appear normal. Anxious appearing. Memory impaired.   Data Reviewed: I have personally reviewed following labs and imaging studies  CBC Lab Results  Component Value Date   WBC 8.8 03/11/2021   RBC  3.65 (L) 03/11/2021   HGB 11.6 (L) 03/11/2021   HCT 35.3 (L) 03/11/2021   MCV 96.7 03/11/2021   MCH 31.8 03/11/2021   PLT 312 03/11/2021   MCHC 32.9 03/11/2021   RDW 13.4 03/11/2021   LYMPHSABS 2.4 03/07/2021   MONOABS 0.9 03/07/2021   EOSABS 0.0 03/07/2021   BASOSABS 0.0 35/59/7416     Last metabolic panel Lab Results  Component Value Date   NA 140 03/11/2021   K 2.7 (LL) 03/11/2021   CL 103 03/11/2021   CO2 30 03/11/2021   BUN 10 03/11/2021   CREATININE 0.70 03/11/2021   GLUCOSE 93 03/11/2021   GFRNONAA >60 03/11/2021   CALCIUM 9.0 03/11/2021   PHOS 2.1 (L) 03/07/2021   PROT 6.4 (L) 03/07/2021   ALBUMIN 3.3 (L) 03/07/2021   BILITOT 0.9 03/07/2021   ALKPHOS 57 03/07/2021   AST 35 03/07/2021   ALT 24 03/07/2021   ANIONGAP 7 03/11/2021    CBG (last 3)  Recent Labs    03/10/21 1712 03/10/21 1925 03/11/21 0732  GLUCAP 112* 156* 102*      GFR: Estimated Creatinine Clearance: 66.4 mL/min (by C-G formula based on SCr of 0.7 mg/dL).  Coagulation Profile: Recent Labs  Lab 03/06/21 1802  INR 1.1     Recent Results (from the past 240 hour(s))  SARS CORONAVIRUS 2 (TAT 6-24 HRS) Nasopharyngeal Nasopharyngeal Swab     Status: None   Collection Time: 03/06/21  6:54 PM   Specimen: Nasopharyngeal Swab  Result Value Ref Range Status   SARS Coronavirus 2 NEGATIVE NEGATIVE Final    Comment: (NOTE) SARS-CoV-2 target nucleic acids are NOT DETECTED.  The SARS-CoV-2 RNA is generally detectable in upper and lower respiratory specimens during the acute phase of infection. Negative results do not preclude SARS-CoV-2 infection, do not rule out co-infections with other pathogens, and should not be used as the sole basis for treatment or other patient management decisions. Negative results must be combined with clinical observations, patient history, and epidemiological information. The expected result is Negative.  Fact Sheet for  Patients: SugarRoll.be  Fact Sheet for Healthcare Providers: https://www.woods-mathews.com/  This test is not yet approved or cleared by the [place] FDA and  has been authorized for detection and/or diagnosis of SARS-CoV-2 by FDA under an Emergency Use Authorization (EUA). This EUA will remain  in effect (meaning this test can be used) for the duration of the COVID-19 declaration under Se ction 564(b)(1) of the Act, 21 U.S.C. section 360bbb-3(b)(1), unless the authorization is terminated or revoked sooner.  Performed at [facility], [place] [address] [place], [place] [address]   Urine culture     Status: None   Collection Time: 03/06/21  7:32 PM   Specimen: Urine, Random  Result Value Ref Range Status   Specimen Description URINE, RANDOM  Final   Special Requests NONE  Final   Culture   Final    NO GROWTH Performed at [facility], [place] [address] [place], [place] [address]    Report Status 03/08/2021 FINAL  Final  MRSA Next Gen by PCR, Nasal     Status: None   Collection Time: 03/06/21  9:50 PM   Specimen: Nasal Mucosa; Nasal Swab  Result Value Ref Range Status   MRSA by PCR Next Gen NOT DETECTED NOT DETECTED Final    Comment: (NOTE) The GeneXpert MRSA Assay (FDA approved for NASAL specimens  only), is one component of a comprehensive MRSA colonization surveillance program. It is not intended to diagnose MRSA infection nor to guide or monitor treatment for MRSA infections. Test performance is not FDA approved in patients less than 81 years old. Performed at [facility], [place] [address] [place], [place] [address]   Culture, blood (routine x 2)     Status: None (Preliminary result)   Collection Time: 03/06/21 10:35 PM   Specimen: BLOOD LEFT WRIST  Result Value Ref Range Status   Specimen Description BLOOD LEFT WRIST  Final   Special Requests   Final    BOTTLES DRAWN AEROBIC ONLY Blood Culture results may not  be optimal due to an inadequate volume of blood received in culture bottles   Culture   Final    NO GROWTH 4 DAYS Performed at [facility], [place] [address], [place], [place] [address]    Report Status PENDING  Incomplete  Culture, blood (routine x 2)     Status: None (Preliminary result)   Collection Time: 03/06/21 10:40 PM   Specimen: BLOOD RIGHT WRIST  Result Value Ref Range Status   Specimen Description BLOOD RIGHT WRIST  Final   Special Requests   Final    BOTTLES DRAWN AEROBIC ONLY Blood Culture results may not be optimal due to an inadequate volume of blood received in culture bottles   Culture   Final    NO GROWTH 4 DAYS Performed at [facility], [place] [address], [place], [place] [address]    Report Status PENDING  Incomplete  Culture, blood (routine x 2)     Status: Abnormal   Collection Time: 03/07/21 10:18 AM   Specimen: BLOOD LEFT ARM  Result Value Ref Range Status   Specimen Description BLOOD LEFT ARM  Final   Special Requests   Final    BOTTLES DRAWN AEROBIC ONLY Blood Culture results may not be optimal due to an inadequate volume of blood received in culture bottles   Culture  Setup Time   Final    GRAM NEGATIVE RODS AEROBIC BOTTLE ONLY Organism ID to follow CRITICAL RESULT CALLED TO, READ BACK BY AND VERIFIED WITH: G. BARR PHARMD, AT 0126 03/09/21 D. Victoriano Lain    Culture (A)  Final    MORAXELLA SPECIES BETA LACTAMASE POSITIVE Performed at [facility], [place] [address] [place],  [address]    Report Status 03/09/2021 FINAL  Final  Blood Culture ID Panel (Reflexed)     Status: None   Collection Time: 03/07/21 10:18 AM  Result Value Ref Range Status   Enterococcus faecalis NOT DETECTED NOT DETECTED Final   Enterococcus Faecium NOT DETECTED NOT DETECTED Final   Listeria monocytogenes NOT DETECTED NOT DETECTED Final   Staphylococcus species NOT DETECTED NOT DETECTED Final   Staphylococcus aureus (BCID) NOT DETECTED NOT DETECTED Final    Staphylococcus epidermidis NOT DETECTED NOT DETECTED Final   Staphylococcus lugdunensis NOT DETECTED NOT DETECTED Final   Streptococcus species NOT DETECTED NOT DETECTED Final   Streptococcus agalactiae NOT DETECTED NOT DETECTED Final   Streptococcus pneumoniae NOT DETECTED NOT DETECTED Final   Streptococcus pyogenes NOT DETECTED NOT DETECTED Final   A.calcoaceticus-baumannii NOT DETECTED NOT DETECTED Final   Bacteroides fragilis NOT DETECTED NOT DETECTED Final   Enterobacterales NOT DETECTED NOT DETECTED Final   Enterobacter cloacae complex NOT DETECTED NOT DETECTED Final   Escherichia coli NOT DETECTED NOT DETECTED Final   Klebsiella aerogenes NOT DETECTED NOT DETECTED Final  Klebsiella oxytoca NOT DETECTED NOT DETECTED Final   Klebsiella pneumoniae NOT DETECTED NOT DETECTED Final   Proteus species NOT DETECTED NOT DETECTED Final   Salmonella species NOT DETECTED NOT DETECTED Final   Serratia marcescens NOT DETECTED NOT DETECTED Final   Haemophilus influenzae NOT DETECTED NOT DETECTED Final   Neisseria meningitidis NOT DETECTED NOT DETECTED Final   Pseudomonas aeruginosa NOT DETECTED NOT DETECTED Final   Stenotrophomonas maltophilia NOT DETECTED NOT DETECTED Final   Candida albicans NOT DETECTED NOT DETECTED Final   Candida auris NOT DETECTED NOT DETECTED Final   Candida glabrata NOT DETECTED NOT DETECTED Final   Candida krusei NOT DETECTED NOT DETECTED Final   Candida parapsilosis NOT DETECTED NOT DETECTED Final   Candida tropicalis NOT DETECTED NOT DETECTED Final   Cryptococcus neoformans/gattii NOT DETECTED NOT DETECTED Final    Comment: Performed at [facility], [place] [address], [place], [place] [address]  CSF culture w Gram Stain     Status: None   Collection Time: 03/07/21 10:25 AM   Specimen: CSF; Cerebrospinal Fluid  Result Value Ref Range Status   Specimen Description CSF  Final   Special Requests LUMBAR PUNCTURE  Final   Gram Stain   Final    WBC PRESENT,  PREDOMINANTLY MONONUCLEAR NO ORGANISMS SEEN CYTOSPIN SMEAR    Culture   Final    NO GROWTH 3 DAYS Performed at [facility], [place] [address] [place], [place] [address]    Report Status 03/10/2021 FINAL  Final  Culture, fungus without smear     Status: None (Preliminary result)   Collection Time: 03/07/21 10:25 AM   Specimen: CSF; Cerebrospinal Fluid  Result Value Ref Range Status   Specimen Description CSF  Final   Special Requests LUMBAR PUNCTURE  Final   Culture   Final    NO FUNGUS ISOLATED AFTER 3 DAYS Performed at [facility], [place] [address], [place], [place] [address]    Report Status PENDING  Incomplete  Culture, blood (routine x 2)     Status: None (Preliminary result)   Collection Time: 03/07/21 11:09 AM   Specimen: BLOOD LEFT WRIST  Result Value Ref Range Status   Specimen Description BLOOD LEFT WRIST  Final   Special Requests   Final    BOTTLES DRAWN AEROBIC ONLY Blood Culture results may not be optimal due to an inadequate volume of blood received in culture bottles   Culture   Final    NO GROWTH 4 DAYS Performed at [facility], [place] [address] [place], [place] [address]    Report Status PENDING  Incomplete  Culture, blood (routine x 2)     Status: None (Preliminary result)   Collection Time: 03/09/21  2:45 AM   Specimen: BLOOD LEFT FOREARM  Result Value Ref Range Status   Specimen Description BLOOD LEFT FOREARM  Final   Special Requests   Final    BOTTLES DRAWN AEROBIC AND ANAEROBIC Blood Culture adequate volume   Culture   Final    NO GROWTH 2 DAYS Performed at [facility], [place] [address] [place], [place] [address]    Report Status PENDING  Incomplete  Culture, blood (routine x 2)     Status: None (Preliminary result)   Collection Time: 03/09/21  2:55 AM   Specimen: BLOOD LEFT FOREARM  Result Value Ref Range Status   Specimen Description BLOOD LEFT FOREARM  Final   Special Requests   Final  BOTTLES DRAWN AEROBIC AND  ANAEROBIC Blood Culture adequate volume   Culture   Final    NO GROWTH 2 DAYS Performed at [facility], [place] [address] [place], [place] [address]    Report Status PENDING  Incomplete         Radiology Studies: CT STEALTH HEAD W/O CONTRAST  Result Date: 03/09/2021 CLINICAL DATA:  Stereotactic CT for surgical planning. EXAM: CT HEAD WITHOUT CONTRAST TECHNIQUE: Contiguous axial images were obtained from the base of the skull through the vertex without intravenous contrast. COMPARISON:  CT head March 06, 2021.  MRI head March 07, 2021. FINDINGS: Limited surgical planning CT. Known multifocal enhancing lesions were better characterized on recent MRI. No unexpected findings on this study. No evidence of new/interval large vascular territory infarct, acute hemorrhage, midline shift, or hydrocephalus. No acute calvarial fracture. Mild ethmoid air cell mucosal thickening with otherwise clear sinuses. No acute orbital findings. No mastoid effusions. IMPRESSION: Limited treatment planning CT. No unexpected findings. Please see recent MRI for characterization of multiple known enhancing lesions. Electronically Signed   By: Margaretha Sheffield MD   On: 03/09/2021 19:45        Scheduled Meds:  chlorhexidine gluconate (MEDLINE KIT)  15 mL Mouth Rinse BID   Chlorhexidine Gluconate Cloth  6 each Topical Daily   docusate  100 mg Oral BID   heparin  5,000 Units Subcutaneous Q8H   insulin aspart  0-15 Units Subcutaneous Q4H   levETIRAcetam  1,000 mg Oral BID   lisinopril  5 mg Oral Daily   mouth rinse  15 mL Mouth Rinse BID   pantoprazole sodium  40 mg Oral QHS   polyethylene glycol  17 g Per Tube Daily   sodium chloride flush  3 mL Intravenous Once   Continuous Infusions:  cefTRIAXone (ROCEPHIN)  IV 2 g (03/11/21 0505)   metronidazole 500 mg (03/11/21 0031)     LOS: 5 days     Cordelia Poche, MD [facility] 03/11/2021, 8:51 AM  If 7PM-7AM, please contact  night-coverage [email or web address]

## 2021-03-12 ENCOUNTER — Other Ambulatory Visit: Payer: Self-pay

## 2021-03-12 ENCOUNTER — Encounter (HOSPITAL_COMMUNITY): Payer: Self-pay

## 2021-03-12 DIAGNOSIS — G939 Disorder of brain, unspecified: Secondary | ICD-10-CM | POA: Diagnosis not present

## 2021-03-12 DIAGNOSIS — R569 Unspecified convulsions: Secondary | ICD-10-CM | POA: Diagnosis not present

## 2021-03-12 DIAGNOSIS — G060 Intracranial abscess and granuloma: Secondary | ICD-10-CM | POA: Diagnosis not present

## 2021-03-12 DIAGNOSIS — R7881 Bacteremia: Secondary | ICD-10-CM | POA: Diagnosis not present

## 2021-03-12 LAB — CULTURE, BLOOD (ROUTINE X 2)
Culture: NO GROWTH
Culture: NO GROWTH
Culture: NO GROWTH

## 2021-03-12 LAB — BASIC METABOLIC PANEL
Anion gap: 6 (ref 5–15)
BUN: 9 mg/dL (ref 8–23)
CO2: 25 mmol/L (ref 22–32)
Calcium: 8.8 mg/dL — ABNORMAL LOW (ref 8.9–10.3)
Chloride: 103 mmol/L (ref 98–111)
Creatinine, Ser: 0.72 mg/dL (ref 0.44–1.00)
GFR, Estimated: >60 mL/min (ref >60)
Glucose, Bld: 108 mg/dL — ABNORMAL HIGH (ref 70–99)
Potassium: 3.9 mmol/L (ref 3.5–5.1)
Sodium: 134 mmol/L — ABNORMAL LOW (ref 135–145)

## 2021-03-12 LAB — TYPE AND SCREEN
ABO/RH(D): A NEG
Antibody Screen: NEGATIVE

## 2021-03-12 LAB — ABO/RH: ABO/RH(D): A NEG

## 2021-03-12 LAB — PROTIME-INR
INR: 1 (ref 0.8–1.2)
Prothrombin Time: 13.2 seconds (ref 11.4–15.2)

## 2021-03-12 LAB — GLUCOSE, CAPILLARY
Glucose-Capillary: 102 mg/dL — ABNORMAL HIGH (ref 70–99)
Glucose-Capillary: 131 mg/dL — ABNORMAL HIGH (ref 70–99)
Glucose-Capillary: 96 mg/dL (ref 70–99)
Glucose-Capillary: 144 mg/dL — ABNORMAL HIGH (ref 70–99)

## 2021-03-12 LAB — APTT: aPTT: 25 seconds (ref 24–36)

## 2021-03-12 MED ORDER — HYDROCHLOROTHIAZIDE 12.5 MG PO CAPS
12.5000 mg | ORAL_CAPSULE | Freq: Every day | ORAL | Status: DC
  Administered 2021-03-12 – 2021-03-17 (×6): 12.5 mg via ORAL
  Filled 2021-03-12 (×6): qty 1

## 2021-03-12 MED ORDER — VANCOMYCIN HCL IN DEXTROSE 1-5 GM/200ML-% IV SOLN
1000.0000 mg | INTRAVENOUS | Status: AC
  Administered 2021-03-13: 1000 mg via INTRAVENOUS
  Filled 2021-03-12: qty 200

## 2021-03-12 MED ORDER — CHLORHEXIDINE GLUCONATE CLOTH 2 % EX PADS
6.0000 | MEDICATED_PAD | Freq: Once | CUTANEOUS | Status: AC
Start: 2021-03-12 — End: 2021-03-13
  Administered 2021-03-13: 6 via TOPICAL

## 2021-03-12 MED ORDER — CHLORHEXIDINE GLUCONATE CLOTH 2 % EX PADS: 6.0000 | MEDICATED_PAD | Freq: Once | CUTANEOUS | Status: AC

## 2021-03-12 NOTE — Progress Notes (Signed)
Subjective: Patient reports that she is doing well. She has no complaints. No acute events overnight.   Objective: Vital signs in last 24 hours: Temp:  [97.7 F (36.5 C)-98.7 F (37.1 C)] 97.8 F (36.6 C) (07/11 0731) Pulse Rate:  [76-91] 76 (07/11 0731) Resp:  [16-20] 19 (07/11 0731) BP: (140-173)/(72-91) 150/72 (07/11 0731) SpO2:  [96 %-100 %] 96 % (07/11 0731)  Intake/Output from previous day: 07/10 0701 - 07/11 0700 In: -  Out: 550 [Urine:550] Intake/Output this shift: No intake/output data recorded.   Physical Exam: Patient is in NAD, awake, A/O X 4, conversant, and in good spirits. Follows commands without difficulty.  No aphasia/dysarthria present. MAEW with good strength. Sensation intact. Slight left hemianopsia.    Lab Results: Recent Labs    03/11/21 0048  WBC 8.8  HGB 11.6*  HCT 35.3*  PLT 312   BMET Recent Labs    03/11/21 0048  NA 140  K 2.7*  CL 103  CO2 30  GLUCOSE 93  BUN 10  CREATININE 0.70  CALCIUM 9.0    Studies/Results: No results found.  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 Right parieto-occipital open biopsy scheduled for 03/13/2021 with Dr. Reatha Armour.   - Continue AEDs - Continue supportive care   LOS: 6 days     Andrea Moeller, DNP, NP-C 03/12/2021, 9:18 AM

## 2021-03-12 NOTE — Progress Notes (Signed)
PROGRESS NOTE    Andrea [name]  FXJ:[id number] DOB: 1945-10-12 DOA: 03/06/2021 PCP: Celene Squibb, MD   Brief Narrative: Andrea [name] is a 75 y.o. female with a history of hypertension. [place] with concern for seizure activity. Patient was admitted to the ICU on admission. LP obtained and MRI significant for multifocal ring enhancing lesions. Keppra initiated with improvement of seizure activity. Blood culture significant for moraxella. Plan for brain biopsy.   Assessment & Plan:   Principal Problem:   Seizure ([place]) Active Problems:   Gram-negative bacteremia   Leukocytosis   Brain abscess   Brain lesion   Seizure In setting of structural brain lesions. Neurology consulted. LP obtained and initial cell count not consistent with infection. Cytology without malignant cells. CSF fluid culture without organism growth. 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  Neurosurgery consulted and plan biopsy on 7/12.  Moraxella bacteremia 1/4 samples positive. Patient was empirically started on meropenem IV. ID consulted and have transitioned her to Ceftriaxone IV/Flagyl. Repeat blood cultures (7/8) with no growth to date. -ID recommendations: Ceftriaxone/Flagyl  Hypertensive urgency Patient is on lisinopril-hydrochlorothiazide as an outpatient. Lisinopril restarted with improvement. -Continue lisinopril; restart hydrochlorothiazide  Hypokalemia Potassium supplementation -Repeat BMP pending  Memory impairment Seems to be related to above. Currently stable.   DVT prophylaxis: Heparin Code Status:   Code Status: Full Code Family Communication: None at bedside Disposition Plan: Discharge home vs SNF in several days pending specialist recommendations/biopsy results   Consultants:  Neurology Neurosurgery PCCM  Procedures:  EEG  (7/6) IMPRESSION: This study is suggestive of moderate diffuse encephalopathy, nonspecific etiology but could be secondary to sedation. No seizures or epileptiform discharges were seen throughout the recording. EEG (7/6) IMPRESSION: This study is suggestive of cortical dysfunction in right hemisphere likely secondary to underlying structural abnormality as well as moderate diffuse encephalopathy, nonspecific etiology but could be secondary to sedation. After propofol was discontinued, encephalopathy improved. No seizures or epileptiform discharges were seen throughout the recording.  Antimicrobials: Meropenem IV Ceftriaxone IV Flagyl    Subjective: No issues noted overnight.  Objective: Vitals:   03/11/21 2013 03/11/21 2338 03/12/21 0400 03/12/21 0731  BP: (!) 151/76 (!) 166/82 (!) 173/91 (!) 150/72  Pulse: 91 78 91 76  Resp: _0 Temp: 97.7 F (36.5 C) 98 F (36.7 C) 97.7 F (36.5 C) 97.8 F (36.6 C)  TempSrc: Oral Oral Oral Oral  SpO2: 96% 98% 96% 96%  Weight:      Height:        Intake/Output Summary (Last 24 hours) at 03/12/2021 0937 Last data filed at 03/12/2021 0844 Gross per 24 hour  Intake --  Output 550 ml  Net -550 ml    Filed Weights   03/07/21 0403 03/09/21 0500 03/10/21 0447  Weight: 83.5 kg 84 kg 84.1 kg    Examination:  General exam: Appears calm and comfortable   Data Reviewed: I have personally reviewed following labs and imaging studies  CBC Lab Results  Component Value Date   WBC 8.8 03/11/2021   RBC 3.65 (L) 03/11/2021   HGB 11.6 (L) 03/11/2021   HCT 35.3 (L) 03/11/2021   MCV 96.7 03/11/2021   MCH 31.8 03/11/2021   PLT 312 03/11/2021   MCHC 32.9 03/11/2021   RDW 13.4 03/11/2021   LYMPHSABS 2.4 03/07/2021   MONOABS 0.9 03/07/2021  EOSABS 0.0 03/07/2021   BASOSABS 0.0 54/05/8118     Last metabolic panel Lab Results  Component Value Date   NA 140 03/11/2021   K 2.7 (LL) 03/11/2021   CL 103 03/11/2021   CO2 30  03/11/2021   BUN 10 03/11/2021   CREATININE 0.70 03/11/2021   GLUCOSE 93 03/11/2021   GFRNONAA >60 03/11/2021   CALCIUM 9.0 03/11/2021   PHOS 2.1 (L) 03/07/2021   PROT 6.4 (L) 03/07/2021   ALBUMIN 3.3 (L) 03/07/2021   BILITOT 0.9 03/07/2021   ALKPHOS 57 03/07/2021   AST 35 03/07/2021   ALT 24 03/07/2021   ANIONGAP 7 03/11/2021    CBG (last 3)  Recent Labs    03/11/21 1619 03/11/21 2019 03/12/21 0729  GLUCAP 96 163* 102*      GFR: Estimated Creatinine Clearance: 66.4 mL/min (by C-G formula based on SCr of 0.7 mg/dL).  Coagulation Profile: Recent Labs  Lab 03/06/21 1802  INR 1.1     Recent Results (from the past 240 hour(s))  SARS CORONAVIRUS 2 (TAT 6-24 HRS) Nasopharyngeal Nasopharyngeal Swab     Status: None   Collection Time: 03/06/21  6:54 PM   Specimen: Nasopharyngeal Swab  Result Value Ref Range Status   SARS Coronavirus 2 NEGATIVE NEGATIVE Final    Comment: (NOTE) SARS-CoV-2 target nucleic acids are NOT DETECTED.  The SARS-CoV-2 RNA is generally detectable in upper and lower respiratory specimens during the acute phase of infection. Negative results do not preclude SARS-CoV-2 infection, do not rule out co-infections with other pathogens, and should not be used as the sole basis for treatment or other patient management decisions. Negative results must be combined with clinical observations, patient history, and epidemiological information. The expected result is Negative.  Fact Sheet for Patients: SugarRoll.be  Fact Sheet for Healthcare Providers: https://www.woods-mathews.com/  This test is not yet approved or cleared by the [place] FDA and  has been authorized for detection and/or diagnosis of SARS-CoV-2 by FDA under an Emergency Use Authorization (EUA). This EUA will remain  in effect (meaning this test can be used) for the duration of the COVID-19 declaration under Se ction 564(b)(1) of the Act, 21  U.S.C. section 360bbb-3(b)(1), unless the authorization is terminated or revoked sooner.  Performed at [facility], [place] [address], [place], [place] [address]   Urine culture     Status: None   Collection Time: 03/06/21  7:32 PM   Specimen: Urine, Random  Result Value Ref Range Status   Specimen Description URINE, RANDOM  Final   Special Requests NONE  Final   Culture   Final    NO GROWTH Performed at [facility], [place] [address], [place], [place] [address]    Report Status 03/08/2021 FINAL  Final  MRSA Next Gen by PCR, Nasal     Status: None   Collection Time: 03/06/21  9:50 PM   Specimen: Nasal Mucosa; Nasal Swab  Result Value Ref Range Status   MRSA by PCR Next Gen NOT DETECTED NOT DETECTED Final    Comment: (NOTE) The GeneXpert MRSA Assay (FDA approved for NASAL specimens only), is one component of a comprehensive MRSA colonization surveillance program. It is not intended to diagnose MRSA infection nor to guide or monitor treatment for MRSA infections. Test performance is not FDA approved in patients less than 5 years old. Performed at [facility], [place] [address] [place], [place] [address]   Culture, blood (routine x 2)  Status: None   Collection Time: 03/06/21 10:35 PM   Specimen: BLOOD LEFT WRIST  Result Value Ref Range Status   Specimen Description BLOOD LEFT WRIST  Final   Special Requests   Final    BOTTLES DRAWN AEROBIC ONLY Blood Culture results may not be optimal due to an inadequate volume of blood received in culture bottles   Culture   Final    NO GROWTH 5 DAYS Performed at [facility], [place] [address], [place], [place] [address]    Report Status 03/12/2021 FINAL  Final  Culture, blood (routine x 2)     Status: None   Collection Time: 03/06/21 10:40 PM   Specimen: BLOOD RIGHT WRIST  Result Value Ref Range Status   Specimen Description BLOOD RIGHT WRIST  Final   Special Requests   Final    BOTTLES DRAWN AEROBIC ONLY Blood  Culture results may not be optimal due to an inadequate volume of blood received in culture bottles   Culture   Final    NO GROWTH 5 DAYS Performed at [facility], [place] [address] [place], [place] [address]    Report Status 03/12/2021 FINAL  Final  Culture, blood (routine x 2)     Status: Abnormal   Collection Time: 03/07/21 10:18 AM   Specimen: BLOOD LEFT ARM  Result Value Ref Range Status   Specimen Description BLOOD LEFT ARM  Final   Special Requests   Final    BOTTLES DRAWN AEROBIC ONLY Blood Culture results may not be optimal due to an inadequate volume of blood received in culture bottles   Culture  Setup Time   Final    GRAM NEGATIVE RODS AEROBIC BOTTLE ONLY Organism ID to follow CRITICAL RESULT CALLED TO, READ BACK BY AND VERIFIED WITH: G. BARR PHARMD, AT 0126 03/09/21 D. Victoriano Lain    Culture (A)  Final    MORAXELLA SPECIES BETA LACTAMASE POSITIVE Performed at [facility], [place] [address], [place],  [address]    Report Status 03/09/2021 FINAL  Final  Blood Culture ID Panel (Reflexed)     Status: None   Collection Time: 03/07/21 10:18 AM  Result Value Ref Range Status   Enterococcus faecalis NOT DETECTED NOT DETECTED Final   Enterococcus Faecium NOT DETECTED NOT DETECTED Final   Listeria monocytogenes NOT DETECTED NOT DETECTED Final   Staphylococcus species NOT DETECTED NOT DETECTED Final   Staphylococcus aureus (BCID) NOT DETECTED NOT DETECTED Final   Staphylococcus epidermidis NOT DETECTED NOT DETECTED Final   Staphylococcus lugdunensis NOT DETECTED NOT DETECTED Final   Streptococcus species NOT DETECTED NOT DETECTED Final   Streptococcus agalactiae NOT DETECTED NOT DETECTED Final   Streptococcus pneumoniae NOT DETECTED NOT DETECTED Final   Streptococcus pyogenes NOT DETECTED NOT DETECTED Final   A.calcoaceticus-baumannii NOT DETECTED NOT DETECTED Final   Bacteroides fragilis NOT DETECTED NOT DETECTED Final   Enterobacterales NOT DETECTED NOT DETECTED  Final   Enterobacter cloacae complex NOT DETECTED NOT DETECTED Final   Escherichia coli NOT DETECTED NOT DETECTED Final   Klebsiella aerogenes NOT DETECTED NOT DETECTED Final   Klebsiella oxytoca NOT DETECTED NOT DETECTED Final   Klebsiella pneumoniae NOT DETECTED NOT DETECTED Final   Proteus species NOT DETECTED NOT DETECTED Final   Salmonella species NOT DETECTED NOT DETECTED Final   Serratia marcescens NOT DETECTED NOT DETECTED Final   Haemophilus influenzae NOT DETECTED NOT DETECTED Final   Neisseria meningitidis NOT DETECTED NOT DETECTED Final   Pseudomonas aeruginosa  NOT DETECTED NOT DETECTED Final   Stenotrophomonas maltophilia NOT DETECTED NOT DETECTED Final   Candida albicans NOT DETECTED NOT DETECTED Final   Candida auris NOT DETECTED NOT DETECTED Final   Candida glabrata NOT DETECTED NOT DETECTED Final   Candida krusei NOT DETECTED NOT DETECTED Final   Candida parapsilosis NOT DETECTED NOT DETECTED Final   Candida tropicalis NOT DETECTED NOT DETECTED Final   Cryptococcus neoformans/gattii NOT DETECTED NOT DETECTED Final    Comment: Performed at [facility], [place] [address], [place], [place] [address]  CSF culture w Gram Stain     Status: None   Collection Time: 03/07/21 10:25 AM   Specimen: CSF; Cerebrospinal Fluid  Result Value Ref Range Status   Specimen Description CSF  Final   Special Requests LUMBAR PUNCTURE  Final   Gram Stain   Final    WBC PRESENT, PREDOMINANTLY MONONUCLEAR NO ORGANISMS SEEN CYTOSPIN SMEAR    Culture   Final    NO GROWTH 3 DAYS Performed at [facility], [place] [address] [place], [place] [address]    Report Status 03/10/2021 FINAL  Final  Culture, fungus without smear     Status: None (Preliminary result)   Collection Time: 03/07/21 10:25 AM   Specimen: CSF; Cerebrospinal Fluid  Result Value Ref Range Status   Specimen Description CSF  Final   Special Requests LUMBAR PUNCTURE  Final   Culture   Final    NO FUNGUS ISOLATED  AFTER 3 DAYS Performed at [facility], [place] [address], [place], [place] [address]    Report Status PENDING  Incomplete  Culture, blood (routine x 2)     Status: None   Collection Time: 03/07/21 11:09 AM   Specimen: BLOOD LEFT WRIST  Result Value Ref Range Status   Specimen Description BLOOD LEFT WRIST  Final   Special Requests   Final    BOTTLES DRAWN AEROBIC ONLY Blood Culture results may not be optimal due to an inadequate volume of blood received in culture bottles   Culture   Final    NO GROWTH 5 DAYS Performed at [facility], [place] [address] [place], [place] [address]    Report Status 03/12/2021 FINAL  Final  Culture, blood (routine x 2)     Status: None (Preliminary result)   Collection Time: 03/09/21  2:45 AM   Specimen: BLOOD LEFT FOREARM  Result Value Ref Range Status   Specimen Description BLOOD LEFT FOREARM  Final   Special Requests   Final    BOTTLES DRAWN AEROBIC AND ANAEROBIC Blood Culture adequate volume   Culture   Final    NO GROWTH 3 DAYS Performed at [facility], [place] [address] [place], [place] [address]    Report Status PENDING  Incomplete  Culture, blood (routine x 2)     Status: None (Preliminary result)   Collection Time: 03/09/21  2:55 AM   Specimen: BLOOD LEFT FOREARM  Result Value Ref Range Status   Specimen Description BLOOD LEFT FOREARM  Final   Special Requests   Final    BOTTLES DRAWN AEROBIC AND ANAEROBIC Blood Culture adequate volume   Culture   Final    NO GROWTH 3 DAYS Performed at [facility], [place] [address], [place], [place] [address]    Report Status PENDING  Incomplete         Radiology Studies: No results found.      Scheduled Meds:  chlorhexidine  gluconate (MEDLINE KIT)  15 mL Mouth Rinse BID   Chlorhexidine Gluconate Cloth  6 each Topical Daily   docusate  100 mg Oral BID   heparin  5,000 Units Subcutaneous Q8H   insulin aspart  0-9 Units Subcutaneous TID WC   levETIRAcetam  1,000 mg Oral  BID   lisinopril  5 mg Oral Daily   mouth rinse  15 mL Mouth Rinse BID   pantoprazole sodium  40 mg Oral QHS   polyethylene glycol  17 g Per Tube Daily   sodium chloride flush  3 mL Intravenous Once   Continuous Infusions:  cefTRIAXone (ROCEPHIN)  IV 2 g (03/12/21 0458)   metronidazole 500 mg (03/12/21 0909)     LOS: 6 days     Cordelia Poche, MD [facility] 03/12/2021, 9:37 AM  If 7PM-7AM, please contact night-coverage [email or web address]

## 2021-03-12 NOTE — Progress Notes (Signed)
[facility] for Infectious Disease  Date of Admission:  03/06/2021      Total days of antibiotics 5  Ceftriaxone 7/08 >> current   Metronidazole 7/08 >> current           ASSESSMENT: Andrea [name] is a 75 y.o. 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 Dr. Merla Riches is planing brain biopsy on 03/13/21 - will follow up results for micro and pathology. LP without any significant inflammation and only 1 WBC, cultures without growth final at 3d.  Fortunately she is feeling improved on antibiotics but understands importance of biopsy to rule out the possibility of malignancy.  Continue ceftriaxone and metronidazole .   Hep C Ab test in AM for routine health screening.    PLAN: Follow up nsgy intervention, path and micro. Would send fungal and AFB cultures also for thoroughness in addition to routine cultures and pathology.  Continue high dose ceftriaxone BID and metronidazole TID  Hep C Ab in am for routine HM.    Principal Problem:   Seizure ([place]) Active Problems:   Gram-negative bacteremia   Leukocytosis   Brain abscess   Brain lesion    chlorhexidine gluconate (MEDLINE KIT)  15 mL Mouth Rinse BID   Chlorhexidine Gluconate Cloth  6 each Topical Daily   docusate  100 mg Oral BID   heparin  5,000 Units Subcutaneous Q8H   hydrochlorothiazide  12.5 mg Oral Daily   insulin aspart  0-9 Units Subcutaneous TID WC   levETIRAcetam  1,000 mg Oral BID   lisinopril  5 mg Oral Daily   mouth rinse  15 mL Mouth Rinse BID   pantoprazole sodium  40 mg Oral QHS   polyethylene glycol  17 g Per Tube Daily   sodium chloride flush  3 mL Intravenous Once    SUBJECTIVE: She and her husband are eating lunch in her room. She feels better since starting the antibiotics. Wonders if this was something from a tick bite.    Review of Systems  Constitutional:  Negative for chills, fever, malaise/fatigue and weight loss.  HENT:  Negative for sore throat.         No dental problems  Respiratory:  Negative for cough and sputum production.   Cardiovascular:  Negative for chest pain and leg swelling.  Gastrointestinal:  Negative for abdominal pain, diarrhea and vomiting.  Genitourinary:  Negative for dysuria and flank pain.  Musculoskeletal:  Negative for joint pain, myalgias and neck pain.  Skin:  Negative for rash.  Neurological:  Negative for dizziness, tingling and headaches.  Psychiatric/Behavioral:  Negative for depression and substance abuse. The patient is not nervous/anxious and does not have insomnia.      Allergies  Allergen Reactions   Penicillins Rash    OBJECTIVE: Vitals:   03/11/21 2338 03/12/21 0400 03/12/21 0731 03/12/21 1232  BP: (!) 166/82 (!) 173/91 (!) 150/72 (!) 141/78  Pulse: 78 91 76 88  Resp: '18 19 19 19  ' Temp: 98 F (36.7 C) 97.7 F (36.5 C) 97.8 F (36.6 C) 98.7 F (37.1 C)  TempSrc: Oral Oral Oral Oral  SpO2: 98% 96% 96% 96%  Weight:      Height:       Body mass index is 29.93 kg/m.   Physical Exam Vitals reviewed.  Constitutional:      Appearance: She is well-developed.     Comments: Seated comfortably in chair.   HENT:  Mouth/Throat:     Mouth: No oral lesions.     Dentition: Normal dentition. No dental abscesses.     Pharynx: No oropharyngeal exudate.  Cardiovascular:     Rate and Rhythm: Normal rate and regular rhythm.     Heart sounds: Normal heart sounds.  Pulmonary:     Effort: Pulmonary effort is normal.     Breath sounds: Normal breath sounds.  Abdominal:     General: There is no distension.     Palpations: Abdomen is soft.     Tenderness: There is no abdominal tenderness.  Lymphadenopathy:     Cervical: No cervical adenopathy.  Skin:    General: Skin is warm and dry.     Findings: No rash.  Neurological:     Mental Status: She is alert and oriented to person, place, and time.  Psychiatric:        Judgment: Judgment normal.     Comments: In good spirits today and  engaged in care discussion     Lab Results Lab Results  Component Value Date   WBC 8.8 03/11/2021   HGB 11.6 (L) 03/11/2021   HCT 35.3 (L) 03/11/2021   MCV 96.7 03/11/2021   PLT 312 03/11/2021    Lab Results  Component Value Date   CREATININE 0.72 03/12/2021   BUN 9 03/12/2021   NA 134 (L) 03/12/2021   K 3.9 03/12/2021   CL 103 03/12/2021   CO2 25 03/12/2021    Lab Results  Component Value Date   ALT 24 03/07/2021   AST 35 03/07/2021   ALKPHOS 57 03/07/2021   BILITOT 0.9 03/07/2021     Microbiology: Recent Results (from the past 240 hour(s))  SARS CORONAVIRUS 2 (TAT 6-24 HRS) Nasopharyngeal Nasopharyngeal Swab     Status: None   Collection Time: 03/06/21  6:54 PM   Specimen: Nasopharyngeal Swab  Result Value Ref Range Status   SARS Coronavirus 2 NEGATIVE NEGATIVE Final    Comment: (NOTE) SARS-CoV-2 target nucleic acids are NOT DETECTED.  The SARS-CoV-2 RNA is generally detectable in upper and lower respiratory specimens during the acute phase of infection. Negative results do not preclude SARS-CoV-2 infection, do not rule out co-infections with other pathogens, and should not be used as the sole basis for treatment or other patient management decisions. Negative results must be combined with clinical observations, patient history, and epidemiological information. The expected result is Negative.  Fact Sheet for Patients: SugarRoll.be  Fact Sheet for Healthcare Providers: https://www.woods-mathews.com/  This test is not yet approved or cleared by the [place] FDA and  has been authorized for detection and/or diagnosis of SARS-CoV-2 by FDA under an Emergency Use Authorization (EUA). This EUA will remain  in effect (meaning this test can be used) for the duration of the COVID-19 declaration under Se ction 564(b)(1) of the Act, 21 U.S.C. section 360bbb-3(b)(1), unless the authorization is terminated or revoked  sooner.  Performed at [facility], [place] [address] [place], [place] [address]   Urine culture     Status: None   Collection Time: 03/06/21  7:32 PM   Specimen: Urine, Random  Result Value Ref Range Status   Specimen Description URINE, RANDOM  Final   Special Requests NONE  Final   Culture   Final    NO GROWTH Performed at [facility], [place] [address], [place], [place] [address]    Report Status 03/08/2021 FINAL  Final  MRSA Next Gen by PCR, Nasal  Status: None   Collection Time: 03/06/21  9:50 PM   Specimen: Nasal Mucosa; Nasal Swab  Result Value Ref Range Status   MRSA by PCR Next Gen NOT DETECTED NOT DETECTED Final    Comment: (NOTE) The GeneXpert MRSA Assay (FDA approved for NASAL specimens only), is one component of a comprehensive MRSA colonization surveillance program. It is not intended to diagnose MRSA infection nor to guide or monitor treatment for MRSA infections. Test performance is not FDA approved in patients less than 59 years old. Performed at [facility], [place] [address] [place], [place] [address]   Culture, blood (routine x 2)     Status: None   Collection Time: 03/06/21 10:35 PM   Specimen: BLOOD LEFT WRIST  Result Value Ref Range Status   Specimen Description BLOOD LEFT WRIST  Final   Special Requests   Final    BOTTLES DRAWN AEROBIC ONLY Blood Culture results may not be optimal due to an inadequate volume of blood received in culture bottles   Culture   Final    NO GROWTH 5 DAYS Performed at [facility], [place] [address] [place], [place] [address]    Report Status 03/12/2021 FINAL  Final  Culture, blood (routine x 2)     Status: None   Collection Time: 03/06/21 10:40 PM   Specimen: BLOOD RIGHT WRIST  Result Value Ref Range Status   Specimen Description BLOOD RIGHT WRIST  Final   Special Requests   Final    BOTTLES DRAWN AEROBIC ONLY Blood Culture results may not be optimal due to an inadequate volume of blood received in  culture bottles   Culture   Final    NO GROWTH 5 DAYS Performed at [facility], [place] [address], [place], [place] [address]    Report Status 03/12/2021 FINAL  Final  Culture, blood (routine x 2)     Status: Abnormal   Collection Time: 03/07/21 10:18 AM   Specimen: BLOOD LEFT ARM  Result Value Ref Range Status   Specimen Description BLOOD LEFT ARM  Final   Special Requests   Final    BOTTLES DRAWN AEROBIC ONLY Blood Culture results may not be optimal due to an inadequate volume of blood received in culture bottles   Culture  Setup Time   Final    GRAM NEGATIVE RODS AEROBIC BOTTLE ONLY Organism ID to follow CRITICAL RESULT CALLED TO, READ BACK BY AND VERIFIED WITH: G. BARR PHARMD, AT 0126 03/09/21 D. Victoriano Lain    Culture (A)  Final    MORAXELLA SPECIES BETA LACTAMASE POSITIVE Performed at [facility], [place] [address], [place],  [address]    Report Status 03/09/2021 FINAL  Final  Blood Culture ID Panel (Reflexed)     Status: None   Collection Time: 03/07/21 10:18 AM  Result Value Ref Range Status   Enterococcus faecalis NOT DETECTED NOT DETECTED Final   Enterococcus Faecium NOT DETECTED NOT DETECTED Final   Listeria monocytogenes NOT DETECTED NOT DETECTED Final   Staphylococcus species NOT DETECTED NOT DETECTED Final   Staphylococcus aureus (BCID) NOT DETECTED NOT DETECTED Final   Staphylococcus epidermidis NOT DETECTED NOT DETECTED Final   Staphylococcus lugdunensis NOT DETECTED NOT DETECTED Final   Streptococcus species NOT DETECTED NOT DETECTED Final   Streptococcus agalactiae NOT DETECTED NOT DETECTED Final   Streptococcus pneumoniae NOT DETECTED NOT DETECTED Final   Streptococcus pyogenes NOT DETECTED NOT DETECTED Final   A.calcoaceticus-baumannii NOT DETECTED NOT DETECTED  Final   Bacteroides fragilis NOT DETECTED NOT DETECTED Final   Enterobacterales NOT DETECTED NOT DETECTED Final   Enterobacter cloacae complex NOT DETECTED NOT DETECTED Final    Escherichia coli NOT DETECTED NOT DETECTED Final   Klebsiella aerogenes NOT DETECTED NOT DETECTED Final   Klebsiella oxytoca NOT DETECTED NOT DETECTED Final   Klebsiella pneumoniae NOT DETECTED NOT DETECTED Final   Proteus species NOT DETECTED NOT DETECTED Final   Salmonella species NOT DETECTED NOT DETECTED Final   Serratia marcescens NOT DETECTED NOT DETECTED Final   Haemophilus influenzae NOT DETECTED NOT DETECTED Final   Neisseria meningitidis NOT DETECTED NOT DETECTED Final   Pseudomonas aeruginosa NOT DETECTED NOT DETECTED Final   Stenotrophomonas maltophilia NOT DETECTED NOT DETECTED Final   Candida albicans NOT DETECTED NOT DETECTED Final   Candida auris NOT DETECTED NOT DETECTED Final   Candida glabrata NOT DETECTED NOT DETECTED Final   Candida krusei NOT DETECTED NOT DETECTED Final   Candida parapsilosis NOT DETECTED NOT DETECTED Final   Candida tropicalis NOT DETECTED NOT DETECTED Final   Cryptococcus neoformans/gattii NOT DETECTED NOT DETECTED Final    Comment: Performed at [facility], [place] [address], [place], [place] [address]  CSF culture w Gram Stain     Status: None   Collection Time: 03/07/21 10:25 AM   Specimen: CSF; Cerebrospinal Fluid  Result Value Ref Range Status   Specimen Description CSF  Final   Special Requests LUMBAR PUNCTURE  Final   Gram Stain   Final    WBC PRESENT, PREDOMINANTLY MONONUCLEAR NO ORGANISMS SEEN CYTOSPIN SMEAR    Culture   Final    NO GROWTH 3 DAYS Performed at [facility], [place] [address], [place], [place] [address]    Report Status 03/10/2021 FINAL  Final  Culture, fungus without smear     Status: None (Preliminary result)   Collection Time: 03/07/21 10:25 AM   Specimen: CSF; Cerebrospinal Fluid  Result Value Ref Range Status   Specimen Description CSF  Final   Special Requests LUMBAR PUNCTURE  Final   Culture   Final    No Fungi Isolated in 4 Weeks Performed at [facility] [facility] [address] [place],  [place] [address]    Report Status PENDING  Incomplete  Culture, blood (routine x 2)     Status: None   Collection Time: 03/07/21 11:09 AM   Specimen: BLOOD LEFT WRIST  Result Value Ref Range Status   Specimen Description BLOOD LEFT WRIST  Final   Special Requests   Final    BOTTLES DRAWN AEROBIC ONLY Blood Culture results may not be optimal due to an inadequate volume of blood received in culture bottles   Culture   Final    NO GROWTH 5 DAYS Performed at [facility], [place] [address], [place], [place] [address]    Report Status 03/12/2021 FINAL  Final  Culture, blood (routine x 2)     Status: None (Preliminary result)   Collection Time: 03/09/21  2:45 AM   Specimen: BLOOD LEFT FOREARM  Result Value Ref Range Status   Specimen Description BLOOD LEFT FOREARM  Final   Special Requests   Final    BOTTLES DRAWN AEROBIC AND ANAEROBIC Blood Culture adequate volume   Culture   Final    NO GROWTH 3 DAYS Performed at [facility], [place] [address] [place], [place] [address]    Report Status PENDING  Incomplete  Culture, blood (routine  x 2)     Status: None (Preliminary result)   Collection Time: 03/09/21  2:55 AM   Specimen: BLOOD LEFT FOREARM  Result Value Ref Range Status   Specimen Description BLOOD LEFT FOREARM  Final   Special Requests   Final    BOTTLES DRAWN AEROBIC AND ANAEROBIC Blood Culture adequate volume   Culture   Final    NO GROWTH 3 DAYS Performed at [facility], [address], [place],  [address]    Report Status PENDING  Incomplete     Janene Madeira, MSN, NP-C [facility] [email or web address] Pager: [phone] Office: [phone] [place]: [phone]  2:07 PM

## 2021-03-12 NOTE — Progress Notes (Signed)
Physical Therapy Treatment Patient Details Name: Andrea [name] MRN: [id number] DOB: 12/25/1945 Today's Date: 03/12/2021    History of Present Illness Pt is a 75 y.o. female who presented 03/06/21 with seizure like activity and L-sided weakness. ETT 7/5 - 7/6. EEG suggestive of moderate diffuse encephalopathy, nonspecific etiology but could be secondary to sedation. 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 S/p LP 7/6 with unremarkable CSF. Plan for possible biopsy 7/12. PMH: HTN.    PT Comments    Patient received in recliner, very pleasant and cooperative but very distractible and with definite STM deficits- forgot multiple times what I Had just asked her to do within the span of 20-30 seconds. Able to mobilize on a min guard basis with min guard today but needed heavy cues for safety and sequencing in busy environment especially due to poor safety awareness, poor sequencing, and impaired dual tasking ability. Left up in recliner with all needs met, chair alarm active. Will continue to follow.     Follow Up Recommendations  Home health PT;Supervision/Assistance - 24 hour     Equipment Recommendations  3in1 (PT);Rolling walker with 5" wheels    Recommendations for Other Services       Precautions / Restrictions Precautions Precautions: Fall Precaution Comments: L hemianopia Restrictions Weight Bearing Restrictions: No    Mobility  Bed Mobility               General bed mobility comments: OOB in recliner    Transfers Overall transfer level: Needs assistance Equipment used: None Transfers: Sit to/from Stand Sit to Stand: Min guard         General transfer comment: min guard for safety,  ongoing cues for hand placement  Ambulation/Gait Ambulation/Gait assistance: Min guard Gait Distance (Feet): 250 Feet Assistive device: None Gait Pattern/deviations: Step-through pattern;Drifts right/left;Trunk flexed     General Gait Details: did much better with gait without RW- is very easily distracted and has a very hard time with dual tasking as evidenced by a need for increased cues for safety when distracted in hallway. Still with mild drift due to being distracted in hallway.   Stairs             Wheelchair Mobility    Modified Rankin (Stroke Patients Only)       Balance Overall balance assessment: Needs assistance Sitting-balance support: No upper extremity supported;Feet supported Sitting balance-Leahy Scale: Good     Standing balance support: No upper extremity supported;During functional activity Standing balance-Leahy Scale: Fair Standing balance comment: minguard when reaching outside base of support                            Cognition Arousal/Alertness: Awake/alert Behavior During Therapy: WFL for tasks assessed/performed Overall Cognitive Status: Impaired/Different from baseline Area of Impairment: Safety/judgement;Awareness;Problem solving                     Memory: Decreased short-term memory Following Commands: Follows multi-step commands inconsistently;Follows one step commands with increased time Safety/Judgement: Decreased awareness of deficits;Decreased awareness of safety Awareness: Intellectual Problem Solving: Difficulty sequencing;Requires verbal cues General Comments: decreased safety awareness, requires cues for navigating around room. Pt with difficulty sequencing tasks. She demonstrates slow processing. Pt appears to have some  short term memory limitations. Scored 2/3 on word recall and unable to process how to count backwards from 100 by serial 7s      Exercises      General Comments General comments (skin  integrity, edema, etc.): VSS on RA      Pertinent Vitals/Pain Pain Assessment: No/denies pain    Home Living                      Prior Function            PT Goals (current goals can now be found in the care plan section) Acute Rehab PT Goals Patient Stated Goal: to go home PT Goal Formulation: With patient/family Time For Goal Achievement: 03/22/21 Potential to Achieve Goals: Good Progress towards PT goals: Progressing toward goals    Frequency    Min 4X/week      PT Plan Current plan remains appropriate    Co-evaluation              AM-PAC PT "6 Clicks" Mobility   Outcome Measure  Help needed turning from your back to your side while in a flat bed without using bedrails?: A Little Help needed moving from lying on your back to sitting on the side of a flat bed without using bedrails?: A Little Help needed moving to and from a bed to a chair (including a wheelchair)?: A Little Help needed standing up from a chair using your arms (e.g., wheelchair or bedside chair)?: A Little Help needed to walk in hospital room?: A Little Help needed climbing 3-5 steps with a railing? : A Little 6 Click Score: 18    End of Session Equipment Utilized During Treatment: Gait belt Activity Tolerance: Patient tolerated treatment well Patient left: in chair;with call bell/phone within reach;with chair alarm set Nurse Communication: Mobility status PT Visit Diagnosis: Unsteadiness on feet (R26.81);Other abnormalities of gait and mobility (R26.89);Muscle weakness (generalized) (M62.81);Difficulty in walking, not elsewhere classified (R26.2);Other symptoms and signs involving the nervous system (R29.898);Dizziness and giddiness (R42)     Time: 9872-1587 PT Time Calculation (min) (ACUTE ONLY): 16 min  Charges:  $Gait Training: 8-22 mins                    Windell Norfolk, DPT, PN1   Supplemental Physical Therapist [place]    Pager [phone] Acute Rehab Office  [phone]

## 2021-03-12 NOTE — Progress Notes (Addendum)
Occupational Therapy Treatment Patient Details Name: Andrea [name] MRN: [id number] DOB: 1946/05/05 Today's Date: 03/12/2021    History of present illness Pt is a 75 y.o. female who presented 03/06/21 with seizure like activity and L-sided weakness. ETT 7/5 - 7/6. EEG suggestive of moderate diffuse encephalopathy, nonspecific etiology but could be secondary to sedation. 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 S/p LP 7/6 with unremarkable CSF. Plan for possible biopsy 7/12. PMH: HTN.   OT comments  Pt received in bed upon OT arrival, agreeable to OT session. Pt denied dizziness throughout today's session. BP 135/77. 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 Pt requires minguard-minA for functional mobility with navigating the room and completing grooming while standing at sink level. She continues to demonstrate cognitive limitations requiring increased cues for safety and sequencing. Pt will continue to benefit from skilled OT services to maximize safety and independence with ADL/IADL and functional mobility. Will continue to follow acutely and progress as tolerated.    Follow Up Recommendations  Home health OT;Supervision/Assistance - 24 hour    Equipment Recommendations  3 in 1 bedside commode    Recommendations for Other Services      Precautions / Restrictions Precautions Precautions: Fall Precaution Comments: L hemianopia Restrictions Weight Bearing Restrictions: No       Mobility Bed Mobility Overal bed mobility: Needs Assistance Bed Mobility: Supine to Sit     Supine to sit: Supervision;HOB elevated Sit to supine: Supervision        Transfers Overall transfer level: Needs  assistance Equipment used: Rolling walker (2 wheeled) Transfers: Sit to/from Stand Sit to Stand: Min guard         General transfer comment: minguard for safety and cues for safe hand placement    Balance Overall balance assessment: Needs assistance Sitting-balance support: No upper extremity supported;Feet supported Sitting balance-Leahy Scale: Good     Standing balance support: Single extremity supported;Bilateral upper extremity supported;No upper extremity supported Standing balance-Leahy Scale: Fair Standing balance comment: minguard when reaching outside base of support                           ADL either performed or assessed with clinical judgement   ADL Overall ADL's : Needs assistance/impaired Eating/Feeding: Set up;Sitting Eating/Feeding Details (indicate cue type and reason): pt eating breakfast upon arrival, required assistance to open containers Grooming: Wash/dry hands;Wash/dry face;Oral care;Brushing hair;Min guard;Standing Grooming Details (indicate cue type and reason): standing at sink, cues for locating items                 Toilet Transfer: Minimal assistance;Ambulation;RW Toilet Transfer Details (indicate cue type and reason): for safety and navigating the room         Functional mobility during ADLs: Minimal assistance;Rolling walker General ADL Comments: pt limited by decreased activity tolerance, instability and cognitive limitations     Vision Baseline Vision/History: Wears glasses Wears Glasses: At all times Patient Visual Report: Peripheral vision impairment Vision Assessment?: Yes Eye Alignment: Within Functional Limits Ocular Range of Motion: Within Functional Limits Alignment/Gaze Preference: Within Defined Limits Tracking/Visual Pursuits: Able to track stimulus in all quads without difficulty Saccades: Undershoots;Decreased speed of saccadic movement Convergence: Within functional limits Visual Fields: Left visual  field deficit Additional Comments: left hemianopsia  fluctuating at times, due to cognitive limitations unsure extent of pt's deficits.   Perception     Praxis      Cognition Arousal/Alertness: Awake/alert Behavior During Therapy: WFL for tasks assessed/performed Overall Cognitive Status: Impaired/Different from baseline Area of Impairment: Safety/judgement;Awareness;Problem solving                 Orientation Level: Disoriented to;Time;Situation (repeatedly asking)   Memory: Decreased short-term memory Following Commands: Follows multi-step commands inconsistently;Follows one step commands with increased time Safety/Judgement: Decreased awareness of deficits;Decreased awareness of safety Awareness: Intellectual Problem Solving: Difficulty sequencing;Requires verbal cues General Comments: decreased safety awareness, requires cues for navigating around room. Pt with difficulty sequencing tasks. She demonstrates slow processing. Pt appears to have some short term memory limitations        Exercises     Shoulder Instructions       General Comments BP 135/77; HR upper 90s with grooming at sink level    Pertinent Vitals/ Pain       Pain Assessment: No/denies pain  Home Living                                          Prior Functioning/Environment              Frequency  Min 2X/week        Progress Toward Goals  OT Goals(current goals can now be found in the care plan section)  Progress towards OT goals: Progressing toward goals  Acute Rehab OT Goals Patient Stated Goal: to go home OT Goal Formulation: With patient Time For Goal Achievement: 03/23/21 Potential to Achieve Goals: Good ADL Goals Pt Will Perform Grooming: with supervision;standing Pt Will Perform Upper Body Bathing: with supervision;sitting Pt Will Perform Lower Body Bathing: with supervision;sitting/lateral leans Pt Will Transfer to Toilet: with  supervision;ambulating;regular height toilet Pt Will Perform Toileting - Clothing Manipulation and hygiene: with supervision;sit to/from stand Pt Will Perform Tub/Shower Transfer: with supervision;ambulating;3 in 1 Additional ADL Goal #1: Pt will tolerate formal vision testing.  Plan Discharge plan remains appropriate    Co-evaluation                 AM-PAC OT "6 Clicks" Daily Activity     Outcome Measure   Help from another person eating meals?: A Little Help from another person taking care of personal grooming?: A Little Help from another person toileting, which includes using toliet, bedpan, or urinal?: A Little Help from another person bathing (including washing, rinsing, drying)?: A Little Help from another person to put on and taking off regular upper body clothing?: A Little Help from another person to put on and taking off regular lower body clothing?: A Little 6 Click Score: 18    End of Session Equipment Utilized During Treatment: Gait belt;Rolling walker  OT Visit Diagnosis: Unsteadiness on feet (R26.81);Other abnormalities of gait and mobility (R26.89);Other symptoms and signs involving cognitive function;Low vision, both eyes (H54.2)   Activity Tolerance Patient tolerated treatment well   Patient Left in chair;with call bell/phone within reach;with chair alarm set;with family/visitor present   Nurse Communication          Time: 1751-0258 OT Time Calculation (min): 33 min  Charges: OT General Charges $OT Visit: 1 Visit OT Treatments $Self Care/Home Management : 23-37 mins  Helene Kelp OTR/L Acute Rehabilitation Services Office: [place] 03/12/2021, 10:36  AM

## 2021-03-13 ENCOUNTER — Inpatient Hospital Stay (HOSPITAL_COMMUNITY): Payer: Medicare Other | Admitting: Anesthesiology

## 2021-03-13 ENCOUNTER — Encounter (HOSPITAL_COMMUNITY): Payer: Self-pay | Admitting: Neurological Surgery

## 2021-03-13 ENCOUNTER — Encounter (HOSPITAL_COMMUNITY)
Admission: EM | Disposition: A | Discharge status: Home Health | Payer: Self-pay | Source: Home / Self Care | Attending: Family Medicine

## 2021-03-13 DIAGNOSIS — R7881 Bacteremia: Secondary | ICD-10-CM | POA: Diagnosis not present

## 2021-03-13 HISTORY — PX: FRAMELESS  BIOPSY WITH BRAINLAB: SHX6879

## 2021-03-13 HISTORY — PX: APPLICATION OF CRANIAL NAVIGATION: SHX6578

## 2021-03-13 LAB — GLUCOSE, CAPILLARY
Glucose-Capillary: 92 mg/dL (ref 70–99)
Glucose-Capillary: 107 mg/dL — ABNORMAL HIGH (ref 70–99)
Glucose-Capillary: 113 mg/dL — ABNORMAL HIGH (ref 70–99)
Glucose-Capillary: 141 mg/dL — ABNORMAL HIGH (ref 70–99)
Glucose-Capillary: 229 mg/dL — ABNORMAL HIGH (ref 70–99)

## 2021-03-13 LAB — CBC
HCT: 38.1 % (ref 36.0–46.0)
Hemoglobin: 12.3 g/dL (ref 12.0–15.0)
MCH: 31.9 pg (ref 26.0–34.0)
MCHC: 32.3 g/dL (ref 30.0–36.0)
MCV: 99 fL (ref 80.0–100.0)
Platelets: 339 10*3/uL (ref 150–400)
RBC: 3.85 MIL/uL — ABNORMAL LOW (ref 3.87–5.11)
RDW: 13.9 % (ref 11.5–15.5)
WBC: 8.6 10*3/uL (ref 4.0–10.5)
nRBC: 0 % (ref 0.0–0.2)

## 2021-03-13 LAB — CREATININE, SERUM
Creatinine, Ser: 0.74 mg/dL (ref 0.44–1.00)
GFR, Estimated: >60 mL/min (ref >60)

## 2021-03-13 SURGERY — FRAMELESS BIOPSY WITH BRAINLAB
Anesthesia: General | Site: Head | Laterality: Right

## 2021-03-13 MED ORDER — LEVETIRACETAM IN NACL 1000 MG/100ML IV SOLN
1000.0000 mg | Freq: Once | INTRAVENOUS | Status: AC
  Administered 2021-03-13: 1000 mg via INTRAVENOUS
  Filled 2021-03-13: qty 100

## 2021-03-13 MED ORDER — ORAL CARE MOUTH RINSE: 15.0000 mL | Freq: Once | OROMUCOSAL | Status: AC

## 2021-03-13 MED ORDER — DEXAMETHASONE SODIUM PHOSPHATE 10 MG/ML IJ SOLN
INTRAMUSCULAR | Status: DC | PRN
  Administered 2021-03-13: 10 mg via INTRAVENOUS

## 2021-03-13 MED ORDER — LIDOCAINE 2% (20 MG/ML) 5 ML SYRINGE
INTRAMUSCULAR | Status: DC | PRN
  Administered 2021-03-13: 60 mg via INTRAVENOUS

## 2021-03-13 MED ORDER — SUGAMMADEX SODIUM 200 MG/2ML IV SOLN
INTRAVENOUS | Status: DC | PRN
  Administered 2021-03-13: 200 mg via INTRAVENOUS

## 2021-03-13 MED ORDER — ONDANSETRON HCL 4 MG/2ML IJ SOLN: 4.0000 mg | INTRAMUSCULAR | Status: DC | PRN

## 2021-03-13 MED ORDER — PHENYLEPHRINE HCL-NACL 10-0.9 MG/250ML-% IV SOLN
INTRAVENOUS | Status: DC | PRN
  Administered 2021-03-13: 30 ug/min via INTRAVENOUS

## 2021-03-13 MED ORDER — MORPHINE SULFATE (PF) 2 MG/ML IV SOLN: 1.0000 mg | INTRAVENOUS | Status: DC | PRN

## 2021-03-13 MED ORDER — LACTATED RINGERS IV SOLN: INTRAVENOUS | Status: DC | PRN

## 2021-03-13 MED ORDER — BACITRACIN ZINC 500 UNIT/GM EX OINT
TOPICAL_OINTMENT | CUTANEOUS | Status: AC
  Filled 2021-03-13: qty 28.35

## 2021-03-13 MED ORDER — PROPOFOL 10 MG/ML IV BOLUS
INTRAVENOUS | Status: AC
  Filled 2021-03-13: qty 20

## 2021-03-13 MED ORDER — HEPARIN SODIUM (PORCINE) 5000 UNIT/ML IJ SOLN
5000.0000 [IU] | Freq: Two times a day (BID) | INTRAMUSCULAR | Status: DC
  Administered 2021-03-15 – 2021-03-17 (×5): 5000 [IU] via SUBCUTANEOUS
  Filled 2021-03-13 (×5): qty 1

## 2021-03-13 MED ORDER — LABETALOL HCL 5 MG/ML IV SOLN
5.0000 mg | Freq: Once | INTRAVENOUS | Status: AC
  Administered 2021-03-13: 5 mg via INTRAVENOUS

## 2021-03-13 MED ORDER — LABETALOL HCL 5 MG/ML IV SOLN
INTRAVENOUS | Status: AC
  Filled 2021-03-13: qty 4

## 2021-03-13 MED ORDER — PANTOPRAZOLE SODIUM 40 MG IV SOLR
40.0000 mg | Freq: Every day | INTRAVENOUS | Status: DC
  Administered 2021-03-13: 40 mg via INTRAVENOUS
  Filled 2021-03-13: qty 40

## 2021-03-13 MED ORDER — THROMBIN 5000 UNITS EX SOLR
CUTANEOUS | Status: AC
  Filled 2021-03-13: qty 5000

## 2021-03-13 MED ORDER — CHLORHEXIDINE GLUCONATE 0.12 % MT SOLN: 15.0000 mL | Freq: Once | OROMUCOSAL | Status: AC

## 2021-03-13 MED ORDER — FENTANYL CITRATE (PF) 250 MCG/5ML IJ SOLN
INTRAMUSCULAR | Status: AC
  Filled 2021-03-13: qty 5

## 2021-03-13 MED ORDER — LABETALOL HCL 5 MG/ML IV SOLN
10.0000 mg | INTRAVENOUS | Status: DC | PRN
  Administered 2021-03-16 – 2021-03-17 (×2): 10 mg via INTRAVENOUS
  Administered 2021-03-17: 30 mg via INTRAVENOUS
  Filled 2021-03-13 (×3): qty 4

## 2021-03-13 MED ORDER — BUPIVACAINE-EPINEPHRINE (PF) 0.5% -1:200000 IJ SOLN
INTRAMUSCULAR | Status: DC | PRN
  Administered 2021-03-13: 5 mL

## 2021-03-13 MED ORDER — ACETAMINOPHEN 500 MG PO TABS
ORAL_TABLET | ORAL | Status: AC
  Administered 2021-03-13: 1000 mg via ORAL
  Filled 2021-03-13: qty 2

## 2021-03-13 MED ORDER — SODIUM CHLORIDE 0.9 % IV SOLN
0.0500 ug/kg/min | INTRAVENOUS | Status: DC
  Administered 2021-03-13: .2 ug/kg/min via INTRAVENOUS
  Filled 2021-03-13: qty 5000

## 2021-03-13 MED ORDER — ONDANSETRON HCL 4 MG PO TABS: 4.0000 mg | ORAL_TABLET | ORAL | Status: DC | PRN

## 2021-03-13 MED ORDER — HYDROCODONE-ACETAMINOPHEN 5-325 MG PO TABS: 1.0000 | ORAL_TABLET | ORAL | Status: DC | PRN

## 2021-03-13 MED ORDER — THROMBIN 20000 UNITS EX KIT: PACK | CUTANEOUS | Status: DC | PRN

## 2021-03-13 MED ORDER — FENTANYL CITRATE (PF) 250 MCG/5ML IJ SOLN
INTRAMUSCULAR | Status: DC | PRN
  Administered 2021-03-13: 25 ug via INTRAVENOUS
  Administered 2021-03-13 (×2): 50 ug via INTRAVENOUS

## 2021-03-13 MED ORDER — ACETAMINOPHEN 500 MG PO TABS: 1000.0000 mg | ORAL_TABLET | Freq: Once | ORAL | Status: AC

## 2021-03-13 MED ORDER — ONDANSETRON HCL 4 MG/2ML IJ SOLN
INTRAMUSCULAR | Status: DC | PRN
  Administered 2021-03-13: 4 mg via INTRAVENOUS

## 2021-03-13 MED ORDER — LIDOCAINE-EPINEPHRINE 1 %-1:100000 IJ SOLN
INTRAMUSCULAR | Status: AC
  Filled 2021-03-13: qty 1

## 2021-03-13 MED ORDER — SODIUM CHLORIDE 0.9 % IV SOLN: INTRAVENOUS | Status: DC

## 2021-03-13 MED ORDER — LACTATED RINGERS IV SOLN: INTRAVENOUS | Status: DC

## 2021-03-13 MED ORDER — LIDOCAINE-EPINEPHRINE 1 %-1:100000 IJ SOLN
INTRAMUSCULAR | Status: DC | PRN
  Administered 2021-03-13: 5 mL

## 2021-03-13 MED ORDER — PROPOFOL 10 MG/ML IV BOLUS
INTRAVENOUS | Status: DC | PRN
  Administered 2021-03-13: 110 mg via INTRAVENOUS
  Administered 2021-03-13: 80 mg via INTRAVENOUS

## 2021-03-13 MED ORDER — BACITRACIN ZINC 500 UNIT/GM EX OINT
TOPICAL_OINTMENT | CUTANEOUS | Status: DC | PRN
  Administered 2021-03-13: 1 via TOPICAL

## 2021-03-13 MED ORDER — THROMBIN 5000 UNITS EX SOLR: OROMUCOSAL | Status: DC | PRN

## 2021-03-13 MED ORDER — PROMETHAZINE HCL 25 MG PO TABS
12.5000 mg | ORAL_TABLET | ORAL | Status: DC | PRN
  Filled 2021-03-13: qty 1

## 2021-03-13 MED ORDER — ROCURONIUM BROMIDE 10 MG/ML (PF) SYRINGE
PREFILLED_SYRINGE | INTRAVENOUS | Status: AC
  Filled 2021-03-13: qty 10

## 2021-03-13 MED ORDER — BUPIVACAINE-EPINEPHRINE 0.5% -1:200000 IJ SOLN
INTRAMUSCULAR | Status: AC
  Filled 2021-03-13: qty 1

## 2021-03-13 MED ORDER — THROMBIN 20000 UNITS EX SOLR
CUTANEOUS | Status: AC
  Filled 2021-03-13: qty 20000

## 2021-03-13 MED ORDER — EPHEDRINE SULFATE 50 MG/ML IJ SOLN
INTRAMUSCULAR | Status: DC | PRN
  Administered 2021-03-13 (×2): 5 mg via INTRAVENOUS

## 2021-03-13 MED ORDER — 0.9 % SODIUM CHLORIDE (POUR BTL) OPTIME
TOPICAL | Status: DC | PRN
  Administered 2021-03-13: 3000 mL

## 2021-03-13 MED ORDER — LIDOCAINE 2% (20 MG/ML) 5 ML SYRINGE
INTRAMUSCULAR | Status: AC
  Filled 2021-03-13: qty 5

## 2021-03-13 MED ORDER — CHLORHEXIDINE GLUCONATE 0.12 % MT SOLN
OROMUCOSAL | Status: AC
  Administered 2021-03-13: 15 mL via OROMUCOSAL
  Filled 2021-03-13: qty 15

## 2021-03-13 MED ORDER — ROCURONIUM BROMIDE 10 MG/ML (PF) SYRINGE
PREFILLED_SYRINGE | INTRAVENOUS | Status: DC | PRN
  Administered 2021-03-13: 10 mg via INTRAVENOUS
  Administered 2021-03-13: 20 mg via INTRAVENOUS
  Administered 2021-03-13: 50 mg via INTRAVENOUS

## 2021-03-13 MED ORDER — FENTANYL CITRATE (PF) 100 MCG/2ML IJ SOLN: 25.0000 ug | INTRAMUSCULAR | Status: DC | PRN

## 2021-03-13 SURGICAL SUPPLY — 84 items
BAG COUNTER SPONGE SURGICOUNT (BAG) ×3 IMPLANT
BAG SPNG CNTER NS LX DISP (BAG) ×2
BAND INSRT 18 STRL LF DISP RB (MISCELLANEOUS) ×4
BAND RUBBER #18 3X1/16 STRL (MISCELLANEOUS) ×6 IMPLANT
BIT DRILL WIRE PASS 1.3MM (BIT) IMPLANT
BLADE CLIPPER SURG (BLADE) ×3 IMPLANT
BUR CARBIDE MATCH 3.0 (BURR) ×3 IMPLANT
BUR SPIRAL ROUTER 2.3 (BUR) ×3 IMPLANT
CANISTER SUCT 3000ML PPV (MISCELLANEOUS) ×3 IMPLANT
CARTRIDGE OIL MAESTRO DRILL (MISCELLANEOUS) ×2 IMPLANT
COVER BURR HOLE UNIV 10 (Orthopedic Implant) ×3 IMPLANT
DIFFUSER DRILL AIR PNEUMATIC (MISCELLANEOUS) ×3 IMPLANT
DRAIN JACKSON PRATT 1/4 1325 (MISCELLANEOUS) IMPLANT
DRAIN JACKSON RD 7FR 3/32 (WOUND CARE) IMPLANT
DRAPE MICROSCOPE LEICA (MISCELLANEOUS) ×3 IMPLANT
DRAPE NEUROLOGICAL W/INCISE (DRAPES) ×3 IMPLANT
DRAPE SHEET LG 3/4 BI-LAMINATE (DRAPES) ×3 IMPLANT
DRAPE SURG 17X23 STRL (DRAPES) IMPLANT
DRAPE WARM FLUID 44X44 (DRAPES) ×3 IMPLANT
DRILL WIRE PASS 1.3MM (BIT)
DRSG TELFA 3X8 NADH (GAUZE/BANDAGES/DRESSINGS) ×3 IMPLANT
DURAPREP 6ML APPLICATOR 50/CS (WOUND CARE) ×3 IMPLANT
ELECT COATED BLADE 2.86 ST (ELECTRODE) ×3 IMPLANT
ELECT REM PT RETURN 9FT ADLT (ELECTROSURGICAL) ×3
ELECTRODE REM PT RTRN 9FT ADLT (ELECTROSURGICAL) ×2 IMPLANT
EVACUATOR SILICONE 100CC (DRAIN) IMPLANT
FORCEPS BIPO MALIS IRRIG 9X1.5 (NEUROSURGERY SUPPLIES) ×3 IMPLANT
GAUZE 4X4 16PLY ~~LOC~~+RFID DBL (SPONGE) IMPLANT
GAUZE SPONGE 4X4 12PLY STRL (GAUZE/BANDAGES/DRESSINGS) IMPLANT
GLOVE EXAM NITRILE XL STR (GLOVE) IMPLANT
GLOVE SRG 8 PF TXTR STRL LF DI (GLOVE) ×4 IMPLANT
GLOVE SURG LTX SZ8 (GLOVE) ×6 IMPLANT
GLOVE SURG UNDER POLY LF SZ8 (GLOVE) ×6
GOWN STRL REUS W/ TWL LRG LVL3 (GOWN DISPOSABLE) IMPLANT
GOWN STRL REUS W/ TWL XL LVL3 (GOWN DISPOSABLE) ×2 IMPLANT
GOWN STRL REUS W/TWL 2XL LVL3 (GOWN DISPOSABLE) IMPLANT
GOWN STRL REUS W/TWL LRG LVL3 (GOWN DISPOSABLE)
GOWN STRL REUS W/TWL XL LVL3 (GOWN DISPOSABLE) ×3
GRAFT DURAGEN MATRIX 1WX1L (Tissue) ×2 IMPLANT
HEMOSTAT POWDER KIT SURGIFOAM (HEMOSTASIS) ×3 IMPLANT
HEMOSTAT SNOW SURGICEL 2X4 (HEMOSTASIS) IMPLANT
HEMOSTAT SURGICEL 2X14 (HEMOSTASIS) ×2 IMPLANT
HEMOSTAT SURGICEL 2X4 FIBR (HEMOSTASIS) IMPLANT
IV NS 1000ML (IV SOLUTION) ×3
IV NS 1000ML BAXH (IV SOLUTION) ×2 IMPLANT
KIT BASIN OR (CUSTOM PROCEDURE TRAY) ×3 IMPLANT
KIT TURNOVER KIT B (KITS) ×3 IMPLANT
MARKER SPHERE PSV REFLC 13MM (MARKER) ×9 IMPLANT
NEEDLE HYPO 22GX1.5 SAFETY (NEEDLE) ×3 IMPLANT
NS IRRIG 1000ML POUR BTL (IV SOLUTION) ×3 IMPLANT
OIL CARTRIDGE MAESTRO DRILL (MISCELLANEOUS) ×3
PACK CRANIOTOMY CUSTOM (CUSTOM PROCEDURE TRAY) ×3 IMPLANT
PAD DRESSING TELFA 3X8 NADH (GAUZE/BANDAGES/DRESSINGS) IMPLANT
PATTIES SURGICAL .5 X.5 (GAUZE/BANDAGES/DRESSINGS) IMPLANT
PATTIES SURGICAL .5 X3 (DISPOSABLE) IMPLANT
PATTIES SURGICAL 1X1 (DISPOSABLE) IMPLANT
PERFORATOR LRG  14-11MM (BIT) ×3
PERFORATOR LRG 14-11MM (BIT) ×2 IMPLANT
PIN MAYFIELD SKULL DISP (PIN) ×3 IMPLANT
RETRACTOR LONE STAR DISPOSABLE (INSTRUMENTS) ×6 IMPLANT
SCREW UNIII AXS SD 1.5X4 (Screw) ×10 IMPLANT
SET CARTRIDGE AND TUBING (SET/KITS/TRAYS/PACK) IMPLANT
SET TUBING IRRIGATION DISP (TUBING) ×3 IMPLANT
SPONGE NEURO XRAY DETECT 1X3 (DISPOSABLE) IMPLANT
SPONGE SURGIFOAM ABS GEL 100 (HEMOSTASIS) ×3 IMPLANT
STAPLER VISISTAT 35W (STAPLE) ×3 IMPLANT
STOCKINETTE 6  STRL (DRAPES) ×3
STOCKINETTE 6 STRL (DRAPES) ×2 IMPLANT
STRIP CLOSURE SKIN 1/2X4 (GAUZE/BANDAGES/DRESSINGS) ×3 IMPLANT
SUT ETHILON 3 0 FSL (SUTURE) IMPLANT
SUT ETHILON 3 0 PS 1 (SUTURE) IMPLANT
SUT NURALON 4 0 TR CR/8 (SUTURE) ×9 IMPLANT
SUT VIC AB 0 CT1 18XCR BRD8 (SUTURE) ×2 IMPLANT
SUT VIC AB 0 CT1 8-18 (SUTURE) ×3
SUT VIC AB 2-0 CP2 18 (SUTURE) ×3 IMPLANT
SUT VICRYL RAPIDE 4/0 PS 2 (SUTURE) ×3 IMPLANT
TIP SHEAR CVD EXTENDED 36KH (INSTRUMENTS) IMPLANT
TOWEL GREEN STERILE (TOWEL DISPOSABLE) ×3 IMPLANT
TOWEL GREEN STERILE FF (TOWEL DISPOSABLE) ×3 IMPLANT
TRAY FOLEY MTR SLVR 16FR STAT (SET/KITS/TRAYS/PACK) ×3 IMPLANT
TUBE CONNECTING 12X1/4 (SUCTIONS) ×3 IMPLANT
UNDERPAD 30X36 HEAVY ABSORB (UNDERPADS AND DIAPERS) ×3 IMPLANT
WATER STERILE IRR 1000ML POUR (IV SOLUTION) ×3 IMPLANT
WRENCH TORQUE 36KHZ (INSTRUMENTS) IMPLANT

## 2021-03-13 NOTE — Op Note (Signed)
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 Pieter Partridge C. Rondi Ivy, DO  ASSISTANT: Dr. Consuella Lose, MD  ANESTHESIA: General Endotracheal  EBL: 20 cc  SPECIMENS: Right parieto-occipital lesion, AFB culture, fungal culture, culture swabs (Gram stain, cell count)  DRAINS: None  COMPLICATIONS: None  CONDITION: Hemodynamically stable  HISTORY: Ghazal Pevey is a 75 y.o. female that presented with a seizure.  Work-up revealed multifocal ring-enhancing lesions in the right temporal lobe, right parietal lobe, right occipital lobe, left temporal lobe, left occipital lobe.  She progressively has had confusion and altered mental status per her family over the past few months.  She has no recent travel.  A lumbar puncture was performed which showed no acute infectious etiology.  Due to the multiple lesions of unknown origin, a brain biopsy was requested which I agreed with.  I discussed with the family and the patient about a needle biopsy versus open biopsy for adequate tissue.  I recommended an open biopsy.  The main concern was between neoplastic (GBM) versus infectious etiology.  All risks, benefits and expected outcomes were discussed and agreed upon with patient and her family.  PROCEDURE IN DETAIL: The patient was brought to the operating room. After induction of general anesthesia, the patient's head was placed in the [place] head holder, the patient was positioned on the operative table in the prone position. All pressure points were meticulously padded.  The neuro navigation system was registered to excellent accuracy to the preoperative MRI merged  with the stereotactic CT.  Using anatomic landmarks, this was verified to be excellently accurate.  The right parieto-occipital tumor was mapped out on the patient's scalp.  The skin was clipped free of hair. Skin incision was then marked out and prepped and draped in the usual sterile fashion.  A physician driven timeout was performed.  Using a 10 blade, a curvilinear incision was created down to the pericranium.  Using Bovie electrocautery and a periosteal elevator, periosteal dissection was performed to expose the cranium.  Self-retaining retractors were placed in the wound.  Using neuro navigation, a craniotomy was planned over the lesion.  A small craniotomy was performed with a high-speed drill and normal fashion.  The bone flap was elevated without issue.  This was placed in antibiotic saline. Epidural hemostasis was achieved with Surgifoam.  A curvilinear durotomy was performed with a 15 blade and Metzenbaum scissors.  The microscope was sterilely draped and brought into the field at this point for microdissection.  There was clearly abnormal gyri noted as indicated on the MRI.  This gyrus was bipolared and cut with microscissors in approximately a 1 cm cubic fashion to include some of the underlying white matter.  The tissue appeared somewhat gray, yellow and abnormal.  This was divided into multiple pieces, 1 sent for frozen pathology.  Within the white matter further dissection was used with suction and bipolar cautery.  A yellowish/fibrous piece of tissue was removed with tumor forceps.  Once I remove this, there was some egress of pus.  The pus was then cultured with swabs.  The specimens removed were divided into the above listed cultures and specimens.  Hemostasis was achieved with bipolar cautery and Surgifoam.  The wound was copiously irrigated  and the resection cavity was copiously irrigated.  It was excellently hemostatic.  A small piece of DuraGen was placed and tucked gently under the dura.   The dura was reapproximated with 4-0 Nurolon suture.  The epidural space was noted to be hemostatic.  The bone flap was plated with the cranial plating system and replaced in its original position and fixed with the cranial plating system.  Self-retaining retractors were taken out of the wound.  Hemostasis was achieved with bipolar cautery.  The galea was closed with 2-0 Vicryl sutures.  The skin was closed with staples.  Sterile dressing was applied.  At this point, the pathology department called with preliminary results of neoplastic tissue and lesional tissue.  At the end of the case all sponge, needle, and instrument counts were correct. The patient was then transferred to the stretcher, the [place] head holder was removed, the pin sites were stable, the patient was extubated, and taken to the post-anesthesia care unit in stable hemodynamic condition.

## 2021-03-13 NOTE — Progress Notes (Signed)
   Providing Compassionate, Quality Care - Together  NEUROSURGERY PROGRESS NOTE   S: Patient seen and examined in recovery  O: EXAM:  BP (!) 158/85 (BP Location: Left Arm)   Pulse 95   Temp (!) 97 F (36.1 C)   Resp (!) 21   Ht 5\' 6"  (1.676 m)   Wt 84.1 kg   LMP  (LMP Unknown)   SpO2 94%   BMI 29.93 kg/m   Awake, alert, oriented  PERRLA EOMI Speech fluent, appropriate  CNs grossly intact Left hemianopsia, unchanged 5/5 BUE/BLE   ASSESSMENT:  75 y.o. female with   Multiple brain lesions, concern for infection vs malignancy -s/p right parietal occipital craniotomy for open biopsy on 03/13/2021  -Prelim pathology was read as neoplasm, however multiple cultures were sent and there was note of some pus intraoperatively   PLAN: -ICU -Antiepileptics -Antibiotics per ID -SBP less than 150 -Frequent neurochecks      Thank you for allowing me to participate in this patient's care.  Please do not hesitate to call with questions or concerns.   Elwin Sleight, [facility] Cell: [phone]

## 2021-03-13 NOTE — Progress Notes (Signed)
   Providing Compassionate, Quality Care - Together  NEUROSURGERY PROGRESS NOTE   S: pt s/e in preop  O: EXAM:  BP (!) 165/92   Pulse 88   Temp 97.8 F (36.6 C) (Oral)   Resp 18   Ht 5\' 6"  (1.676 m)   Wt 84.1 kg   LMP  (LMP Unknown)   SpO2 98%   BMI 29.93 kg/m   Awake, alert, oriented  PERRL L visual field cut, hemianopsia Speech fluent, appropriate  CNs grossly intact  5/5 BUE/BLE   ASSESSMENT:  75 y.o. female with   Multiple brain lesions, concern for infection vs malignancy  PLAN: - OR today for open biopsy, right parietal/occipital - will send cultures as well - d/w patient and family about risks and benefits and expected outcomes. They all agree to proceed    Thank you for allowing me to participate in this patient's care.  Please do not hesitate to call with questions or concerns.   Elwin Sleight, [facility] Cell: [phone]

## 2021-03-13 NOTE — Anesthesia Procedure Notes (Signed)
Procedure Name: Intubation Date/Time: 03/13/2021 2:04 PM Performed by: Trinna Post., CRNA Pre-anesthesia Checklist: Patient identified, Emergency Drugs available, Suction available and Patient being monitored Patient Re-evaluated:Patient Re-evaluated prior to induction Oxygen Delivery Method: Circle system utilized Preoxygenation: Pre-oxygenation with 100% oxygen Induction Type: IV induction Ventilation: Mask ventilation without difficulty Laryngoscope Size: Mac and 3 Grade View: Grade II Tube type: Oral Tube size: 7.0 mm Number of attempts: 1 Airway Equipment and Method: Stylet and Oral airway Placement Confirmation: ETT inserted through vocal cords under direct vision, positive ETCO2 and breath sounds checked- equal and bilateral Secured at: 21 cm Tube secured with: Tape Dental Injury: Teeth and Oropharynx as per pre-operative assessment  Comments: Inserted by Paulina Fusi, SRNA

## 2021-03-13 NOTE — Transfer of Care (Signed)
Immediate Anesthesia Transfer of Care Note  Patient: Andrea [name]  Procedure(s) Performed: Right Parietal-Occipital craniotomy, open biopsy for brain lesion (Right: Head) APPLICATION OF CRANIAL NAVIGATION  Patient Location: PACU  Anesthesia Type:General  Level of Consciousness: awake and alert   Airway & Oxygen Therapy: Patient Spontanous Breathing and Patient connected to face mask oxygen  Post-op Assessment: Report given to RN and Post -op Vital signs reviewed and stable  Post vital signs: Reviewed and stable  Last Vitals:  Vitals Value Taken Time  BP 165/96 03/13/21 1623  Temp    Pulse 91 03/13/21 1628  Resp 17 03/13/21 1628  SpO2 100 % 03/13/21 1628  Vitals shown include unvalidated device data.  Last Pain:  Vitals:   03/13/21 0944  TempSrc: Oral  PainSc:          Complications: No notable events documented.

## 2021-03-13 NOTE — Anesthesia Procedure Notes (Signed)
Arterial Line Insertion Start/End7/08/2021 10:41 AM, 03/13/2021 10:41 AM Performed by: CRNA  Patient location: OOR procedure area. Preanesthetic checklist: patient identified, IV checked, site marked, risks and benefits discussed, surgical consent, monitors and equipment checked, pre-op evaluation, timeout performed and anesthesia consent Lidocaine 1% used for infiltration Right, radial was placed Catheter size: 20 G Hand hygiene performed , maximum sterile barriers used  and Seldinger technique used Allen's test indicative of satisfactory collateral circulation Attempts: 2 Procedure performed without using ultrasound guided technique. Following insertion, dressing applied and Biopatch. Post procedure assessment: normal  Patient tolerated the procedure well with no immediate complications.

## 2021-03-13 NOTE — Anesthesia Postprocedure Evaluation (Signed)
Anesthesia Post Note  Patient: Andrea [name]  Procedure(s) Performed: Right Parietal-Occipital craniotomy, open biopsy for brain lesion (Right: Head) [place]     Patient location during evaluation: PACU Anesthesia Type: General Level of consciousness: awake and alert Pain management: pain level controlled Vital Signs Assessment: post-procedure vital signs reviewed and stable Respiratory status: spontaneous breathing, nonlabored ventilation and respiratory function stable Cardiovascular status: blood pressure returned to baseline and stable Postop Assessment: no apparent nausea or vomiting Anesthetic complications: no   No notable events documented.  Last Vitals:  Vitals:   03/13/21 1653 03/13/21 1708  BP: 119/86 (!) 151/79  Pulse: 77 77  Resp: 17 18  Temp:  (!) 36.1 C  SpO2: 96% 97%    Last Pain:  Vitals:   03/13/21 1623  TempSrc:   PainSc: 0-No pain                 Garo Heidelberg,W. EDMOND

## 2021-03-13 NOTE — Progress Notes (Addendum)
PROGRESS NOTE    Andrea [name]  NLZ:[id number] DOB: 1945/12/03 DOA: 03/06/2021 PCP: Celene Squibb, MD   Brief Narrative: Andrea [name] is a 75 y.o. female with a history of hypertension. [place] with concern for seizure activity. Patient was admitted to the ICU on admission. LP obtained and MRI significant for multifocal ring enhancing lesions. Keppra initiated with improvement of seizure activity. Blood culture significant for moraxella. Plan for brain biopsy.   Assessment & Plan:   Principal Problem:   Seizure ([place]) Active Problems:   Gram-negative bacteremia   Leukocytosis   Brain abscess   Brain lesion   Seizure In setting of structural brain lesions. Neurology consulted. LP obtained and initial cell count not consistent with infection. Cytology without malignant cells. CSF fluid culture without organism growth. 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  Neurosurgery consulted and plan biopsy on today.  Moraxella bacteremia 1/4 samples positive. Patient was empirically started on meropenem IV. ID consulted and have transitioned her to Ceftriaxone IV/Flagyl. Repeat blood cultures (7/8) with no growth to date. -ID recommendations: Ceftriaxone/Flagyl  Hypertensive urgency Patient is on lisinopril-hydrochlorothiazide as an outpatient. Lisinopril restarted with improvement. -Continue lisinopril; restart hydrochlorothiazide  Hypokalemia Potassium supplementation given. Resolved.  Memory impairment Seems to be related to above. Currently stable.   DVT prophylaxis: Heparin Code Status:   Code Status: Full Code Family Communication: None at bedside Disposition Plan: Discharge home in 1-3 days pending specialist recommendations/biopsy results   Consultants:  Neurology Neurosurgery PCCM  Procedures:  EEG  (7/6) IMPRESSION: This study is suggestive of moderate diffuse encephalopathy, nonspecific etiology but could be secondary to sedation. No seizures or epileptiform discharges were seen throughout the recording. EEG (7/6) IMPRESSION: This study is suggestive of cortical dysfunction in right hemisphere likely secondary to underlying structural abnormality as well as moderate diffuse encephalopathy, nonspecific etiology but could be secondary to sedation. After propofol was discontinued, encephalopathy improved. No seizures or epileptiform discharges were seen throughout the recording.  Antimicrobials: Meropenem IV Ceftriaxone IV Flagyl    Subjective: No reported issues overnight. Patient is anxious about surgery. Patient receiving sensitive patient care at time of visit to her room.  Objective: Vitals:   03/12/21 2007 03/13/21 0011 03/13/21 0721 03/13/21 0944  BP: 133/83 (!) 174/94 (!) 157/83 (!) 165/92  Pulse: 88 78 77 88  Resp: _0 Temp: 98 F (36.7 C) 98.3 F (36.8 C) 98.3 F (36.8 C) 97.8 F (36.6 C)  TempSrc: Axillary Axillary Oral Oral  SpO2: 95% 94% 97% 98%  Weight:    84.1 kg  Height:    _1  (1.676 m)    Intake/Output Summary (Last 24 hours) at 03/13/2021 1558 Last data filed at 03/13/2021 1556 Gross per 24 hour  Intake 860 ml  Output 2 ml  Net 858 ml    Filed Weights   03/09/21 0500 03/10/21 0447 03/13/21 0944  Weight: 84 kg 84.1 kg 84.1 kg    Examination:  General: Well appearing, no distress   Data Reviewed: I have personally reviewed following labs and imaging studies  CBC Lab Results  Component Value Date   WBC 8.8 03/11/2021   RBC 3.65 (L) 03/11/2021   HGB 11.6 (L) 03/11/2021   HCT 35.3 (L) 03/11/2021   MCV 96.7 03/11/2021   MCH 31.8 03/11/2021   PLT 312 03/11/2021   MCHC 32.9  03/11/2021   RDW 13.4 03/11/2021   LYMPHSABS 2.4 03/07/2021   MONOABS 0.9 03/07/2021   EOSABS 0.0 03/07/2021   BASOSABS 0.0 03/07/2021     Last  metabolic panel Lab Results  Component Value Date   NA 134 (L) 03/12/2021   K 3.9 03/12/2021   CL 103 03/12/2021   CO2 25 03/12/2021   BUN 9 03/12/2021   CREATININE 0.72 03/12/2021   GLUCOSE 108 (H) 03/12/2021   GFRNONAA >60 03/12/2021   CALCIUM 8.8 (L) 03/12/2021   PHOS 2.1 (L) 03/07/2021   PROT 6.4 (L) 03/07/2021   ALBUMIN 3.3 (L) 03/07/2021   BILITOT 0.9 03/07/2021   ALKPHOS 57 03/07/2021   AST 35 03/07/2021   ALT 24 03/07/2021   ANIONGAP 6 03/12/2021    CBG (last 3)  Recent Labs    03/13/21 0927 03/13/21 1046 03/13/21 1339  GLUCAP 107* 113* 141*      GFR: Estimated Creatinine Clearance: 66.4 mL/min (by C-G formula based on SCr of 0.72 mg/dL).  Coagulation Profile: Recent Labs  Lab 03/06/21 1802 03/12/21 1833  INR 1.1 1.0     Recent Results (from the past 240 hour(s))  SARS CORONAVIRUS 2 (TAT 6-24 HRS) Nasopharyngeal Nasopharyngeal Swab     Status: None   Collection Time: 03/06/21  6:54 PM   Specimen: Nasopharyngeal Swab  Result Value Ref Range Status   SARS Coronavirus 2 NEGATIVE NEGATIVE Final    Comment: (NOTE) SARS-CoV-2 target nucleic acids are NOT DETECTED.  The SARS-CoV-2 RNA is generally detectable in upper and lower respiratory specimens during the acute phase of infection. Negative results do not preclude SARS-CoV-2 infection, do not rule out co-infections with other pathogens, and should not be used as the sole basis for treatment or other patient management decisions. Negative results must be combined with clinical observations, patient history, and epidemiological information. The expected result is Negative.  Fact Sheet for Patients: SugarRoll.be  Fact Sheet for Healthcare Providers: https://www.woods-mathews.com/  This test is not yet approved or cleared by the [place] FDA and  has been authorized for detection and/or diagnosis of SARS-CoV-2 by FDA under an Emergency Use Authorization  (EUA). This EUA will remain  in effect (meaning this test can be used) for the duration of the COVID-19 declaration under Se ction 564(b)(1) of the Act, 21 U.S.C. section 360bbb-3(b)(1), unless the authorization is terminated or revoked sooner.  Performed at [facility], [place] [address], [place], [place] [address]   Urine culture     Status: None   Collection Time: 03/06/21  7:32 PM   Specimen: Urine, Random  Result Value Ref Range Status   Specimen Description URINE, RANDOM  Final   Special Requests NONE  Final   Culture   Final    NO GROWTH Performed at [facility], [place] [address] [place], [place] [address]    Report Status 03/08/2021 FINAL  Final  MRSA Next Gen by PCR, Nasal     Status: None   Collection Time: 03/06/21  9:50 PM   Specimen: Nasal Mucosa; Nasal Swab  Result Value Ref Range Status   MRSA by PCR Next Gen NOT DETECTED NOT DETECTED Final    Comment: (NOTE) The GeneXpert MRSA Assay (FDA approved for NASAL specimens only), is one component of a comprehensive MRSA colonization surveillance program. It is not intended to diagnose MRSA infection nor to guide or monitor treatment for MRSA infections. Test performance is not FDA approved in patients less than 2 years  old. Performed at [facility], [place] [address] [place], [place] [address]   Culture, blood (routine x 2)     Status: None   Collection Time: 03/06/21 10:35 PM   Specimen: BLOOD LEFT WRIST  Result Value Ref Range Status   Specimen Description BLOOD LEFT WRIST  Final   Special Requests   Final    BOTTLES DRAWN AEROBIC ONLY Blood Culture results may not be optimal due to an inadequate volume of blood received in culture bottles   Culture   Final    NO GROWTH 5 DAYS Performed at [facility], [place] [address], [place], [place] [address]    Report Status 03/12/2021 FINAL  Final  Culture, blood (routine x 2)     Status: None   Collection Time: 03/06/21 10:40 PM   Specimen: BLOOD  RIGHT WRIST  Result Value Ref Range Status   Specimen Description BLOOD RIGHT WRIST  Final   Special Requests   Final    BOTTLES DRAWN AEROBIC ONLY Blood Culture results may not be optimal due to an inadequate volume of blood received in culture bottles   Culture   Final    NO GROWTH 5 DAYS Performed at [facility], [place] [address], [place], [place] [address]    Report Status 03/12/2021 FINAL  Final  Culture, blood (routine x 2)     Status: Abnormal   Collection Time: 03/07/21 10:18 AM   Specimen: BLOOD LEFT ARM  Result Value Ref Range Status   Specimen Description BLOOD LEFT ARM  Final   Special Requests   Final    BOTTLES DRAWN AEROBIC ONLY Blood Culture results may not be optimal due to an inadequate volume of blood received in culture bottles   Culture  Setup Time   Final    GRAM NEGATIVE RODS AEROBIC BOTTLE ONLY Organism ID to follow CRITICAL RESULT CALLED TO, READ BACK BY AND VERIFIED WITH: G. BARR PHARMD, AT 0126 03/09/21 D. Victoriano Lain    Culture (A)  Final    MORAXELLA SPECIES BETA LACTAMASE POSITIVE Performed at [facility], [place] [address] [place], [place] [address]    Report Status 03/09/2021 FINAL  Final  Blood Culture ID Panel (Reflexed)     Status: None   Collection Time: 03/07/21 10:18 AM  Result Value Ref Range Status   Enterococcus faecalis NOT DETECTED NOT DETECTED Final   Enterococcus Faecium NOT DETECTED NOT DETECTED Final   Listeria monocytogenes NOT DETECTED NOT DETECTED Final   Staphylococcus species NOT DETECTED NOT DETECTED Final   Staphylococcus aureus (BCID) NOT DETECTED NOT DETECTED Final   Staphylococcus epidermidis NOT DETECTED NOT DETECTED Final   Staphylococcus lugdunensis NOT DETECTED NOT DETECTED Final   Streptococcus species NOT DETECTED NOT DETECTED Final   Streptococcus agalactiae NOT DETECTED NOT DETECTED Final   Streptococcus pneumoniae NOT DETECTED NOT DETECTED Final   Streptococcus pyogenes NOT DETECTED NOT DETECTED Final    A.calcoaceticus-baumannii NOT DETECTED NOT DETECTED Final   Bacteroides fragilis NOT DETECTED NOT DETECTED Final   Enterobacterales NOT DETECTED NOT DETECTED Final   Enterobacter cloacae complex NOT DETECTED NOT DETECTED Final   Escherichia coli NOT DETECTED NOT DETECTED Final   Klebsiella aerogenes NOT DETECTED NOT DETECTED Final   Klebsiella oxytoca NOT DETECTED NOT DETECTED Final   Klebsiella pneumoniae NOT DETECTED NOT DETECTED Final   Proteus species NOT DETECTED NOT DETECTED Final   Salmonella species NOT DETECTED NOT DETECTED Final   Serratia marcescens NOT DETECTED  NOT DETECTED Final   Haemophilus influenzae NOT DETECTED NOT DETECTED Final   Neisseria meningitidis NOT DETECTED NOT DETECTED Final   Pseudomonas aeruginosa NOT DETECTED NOT DETECTED Final   Stenotrophomonas maltophilia NOT DETECTED NOT DETECTED Final   Candida albicans NOT DETECTED NOT DETECTED Final   Candida auris NOT DETECTED NOT DETECTED Final   Candida glabrata NOT DETECTED NOT DETECTED Final   Candida krusei NOT DETECTED NOT DETECTED Final   Candida parapsilosis NOT DETECTED NOT DETECTED Final   Candida tropicalis NOT DETECTED NOT DETECTED Final   Cryptococcus neoformans/gattii NOT DETECTED NOT DETECTED Final    Comment: Performed at [facility], [place] [address] [place], [place] [address]  CSF culture w Gram Stain     Status: None   Collection Time: 03/07/21 10:25 AM   Specimen: CSF; Cerebrospinal Fluid  Result Value Ref Range Status   Specimen Description CSF  Final   Special Requests LUMBAR PUNCTURE  Final   Gram Stain   Final    WBC PRESENT, PREDOMINANTLY MONONUCLEAR NO ORGANISMS SEEN CYTOSPIN SMEAR    Culture   Final    NO GROWTH 3 DAYS Performed at [facility], [place] [address] [place], [place] [address]    Report Status 03/10/2021 FINAL  Final  Culture, fungus without smear     Status: None (Preliminary result)   Collection Time: 03/07/21 10:25 AM   Specimen: CSF; Cerebrospinal  Fluid  Result Value Ref Range Status   Specimen Description CSF  Final   Special Requests LUMBAR PUNCTURE  Final   Culture   Final    NO FUNGUS ISOLATED AFTER 5 DAYS Performed at [facility], [place] [address] [place], [place] [address]    Report Status PENDING  Incomplete  Culture, blood (routine x 2)     Status: None   Collection Time: 03/07/21 11:09 AM   Specimen: BLOOD LEFT WRIST  Result Value Ref Range Status   Specimen Description BLOOD LEFT WRIST  Final   Special Requests   Final    BOTTLES DRAWN AEROBIC ONLY Blood Culture results may not be optimal due to an inadequate volume of blood received in culture bottles   Culture   Final    NO GROWTH 5 DAYS Performed at [facility], [place] [address] [place], [place] [address]    Report Status 03/12/2021 FINAL  Final  Culture, blood (routine x 2)     Status: None (Preliminary result)   Collection Time: 03/09/21  2:45 AM   Specimen: BLOOD LEFT FOREARM  Result Value Ref Range Status   Specimen Description BLOOD LEFT FOREARM  Final   Special Requests   Final    BOTTLES DRAWN AEROBIC AND ANAEROBIC Blood Culture adequate volume   Culture   Final    NO GROWTH 4 DAYS Performed at [facility], [place] [address] [place], [place] [address]    Report Status PENDING  Incomplete  Culture, blood (routine x 2)     Status: None (Preliminary result)   Collection Time: 03/09/21  2:55 AM   Specimen: BLOOD LEFT FOREARM  Result Value Ref Range Status   Specimen Description BLOOD LEFT FOREARM  Final   Special Requests   Final    BOTTLES DRAWN AEROBIC AND ANAEROBIC Blood Culture adequate volume   Culture   Final    NO GROWTH 4 DAYS Performed at [facility], [place] [address] [place], [place] [address]    Report Status  PENDING  Incomplete         Radiology Studies: No results found.      Scheduled Meds:  [MAR Hold] chlorhexidine gluconate (MEDLINE KIT)  15 mL Mouth Rinse BID   [MAR Hold] Chlorhexidine Gluconate  Cloth  6 each Topical Daily   [MAR Hold] docusate  100 mg Oral BID   [MAR Hold] hydrochlorothiazide  12.5 mg Oral Daily   [MAR Hold] insulin aspart  0-9 Units Subcutaneous TID WC   [MAR Hold] levETIRAcetam  1,000 mg Oral BID   [MAR Hold] lisinopril  5 mg Oral Daily   [MAR Hold] mouth rinse  15 mL Mouth Rinse BID   [MAR Hold] pantoprazole sodium  40 mg Oral QHS   [MAR Hold] polyethylene glycol  17 g Per Tube Daily   [MAR Hold] sodium chloride flush  3 mL Intravenous Once   Continuous Infusions:  [MAR Hold] cefTRIAXone (ROCEPHIN)  IV 2 g (03/13/21 0424)   lactated ringers 10 mL/hr at 03/13/21 1016   [MAR Hold] metronidazole 500 mg (03/13/21 0819)   remifentanil (ULTIVA) 5000 mcg in 250 mL normal saline (20 mcg/mL) for ICU use 0.1 mcg/kg/min (03/13/21 1435)     LOS: 7 days     Cordelia Poche, MD [facility] 03/13/2021, 3:58 PM  If 7PM-7AM, please contact night-coverage [email or web address]

## 2021-03-13 NOTE — Anesthesia Preprocedure Evaluation (Addendum)
Anesthesia Evaluation  Patient identified by MRN, date of birth, ID band Patient awake    Reviewed: Allergy & Precautions, H&P , NPO status , Patient's Chart, lab work & pertinent test results  Airway Mallampati: II  TM Distance: >3 FB Neck ROM: Full    Dental no notable dental hx. (+) Partial Lower, Dental Advisory Given   Pulmonary neg pulmonary ROS,    Pulmonary exam normal breath sounds clear to auscultation       Cardiovascular hypertension, On Medications negative cardio ROS   Rhythm:Regular Rate:Normal     Neuro/Psych Seizures -,  negative neurological ROS  negative psych ROS   GI/Hepatic negative GI ROS, Neg liver ROS,   Endo/Other  negative endocrine ROS  Renal/GU negative Renal ROS  negative genitourinary   Musculoskeletal   Abdominal   Peds  Hematology negative hematology ROS (+)   Anesthesia Other Findings   Reproductive/Obstetrics negative OB ROS                            Anesthesia Physical Anesthesia Plan  ASA: 2  Anesthesia Plan: General   Post-op Pain Management:    Induction: Intravenous  PONV Risk Score and Plan: 4 or greater and Ondansetron, Dexamethasone and Treatment may vary due to age or medical condition  Airway Management Planned: Oral ETT  Additional Equipment: Arterial line  Intra-op Plan:   Post-operative Plan: Extubation in OR  Informed Consent: I have reviewed the patients History and Physical, chart, labs and discussed the procedure including the risks, benefits and alternatives for the proposed anesthesia with the patient or authorized representative who has indicated his/her understanding and acceptance.     Dental advisory given  Plan Discussed with: CRNA  Anesthesia Plan Comments:         Anesthesia Quick Evaluation

## 2021-03-14 ENCOUNTER — Encounter (HOSPITAL_COMMUNITY): Payer: Self-pay | Admitting: Neurological Surgery

## 2021-03-14 DIAGNOSIS — G060 Intracranial abscess and granuloma: Secondary | ICD-10-CM | POA: Diagnosis not present

## 2021-03-14 DIAGNOSIS — R569 Unspecified convulsions: Secondary | ICD-10-CM | POA: Diagnosis not present

## 2021-03-14 DIAGNOSIS — G939 Disorder of brain, unspecified: Secondary | ICD-10-CM | POA: Diagnosis not present

## 2021-03-14 DIAGNOSIS — R7881 Bacteremia: Secondary | ICD-10-CM | POA: Diagnosis not present

## 2021-03-14 LAB — GLUCOSE, CAPILLARY
Glucose-Capillary: 99 mg/dL (ref 70–99)
Glucose-Capillary: 120 mg/dL — ABNORMAL HIGH (ref 70–99)
Glucose-Capillary: 130 mg/dL — ABNORMAL HIGH (ref 70–99)
Glucose-Capillary: 117 mg/dL — ABNORMAL HIGH (ref 70–99)

## 2021-03-14 LAB — HCV AB W REFLEX TO QUANT PCR: HCV Ab: 0.2 s/co ratio (ref 0.0–0.9)

## 2021-03-14 LAB — CULTURE, BLOOD (ROUTINE X 2)
Culture: NO GROWTH
Culture: NO GROWTH
Special Requests: ADEQUATE
Special Requests: ADEQUATE

## 2021-03-14 LAB — HCV INTERPRETATION

## 2021-03-14 MED ORDER — ACETAMINOPHEN 325 MG PO TABS
650.0000 mg | ORAL_TABLET | Freq: Four times a day (QID) | ORAL | Status: DC | PRN
  Administered 2021-03-14 – 2021-03-17 (×7): 650 mg via ORAL
  Filled 2021-03-14 (×7): qty 2

## 2021-03-14 MED ORDER — PANTOPRAZOLE SODIUM 40 MG PO TBEC
40.0000 mg | DELAYED_RELEASE_TABLET | Freq: Every day | ORAL | Status: DC
  Administered 2021-03-14 – 2021-03-16 (×3): 40 mg via ORAL
  Filled 2021-03-14 (×3): qty 1

## 2021-03-14 NOTE — Progress Notes (Addendum)
PROGRESS NOTE    Andrea [name]  WCH:[id number] DOB: 10/03/1945 DOA: 03/06/2021 PCP: Celene Squibb, MD   Brief Narrative: Andrea [name] is a 75 y.o. female with a history of hypertension. Patient [place] with concern for seizure activity. Patient was admitted to the ICU on admission. LP obtained and MRI significant for multifocal ring enhancing lesions. Keppra initiated with improvement of seizure activity. Blood culture significant for moraxella. Plan for brain biopsy.   Assessment & Plan:   Principal Problem:   Seizure ([place]) Active Problems:   Gram-negative bacteremia   Leukocytosis   Brain abscess   Brain lesion   Seizure, likely provoked in the setting of multiple ring-enhancing brain lesions POA Rule out infection Concern for neoplastic origin Neurology and neurosurgery following, appreciate insight and recommendations LP initially obtained not consistent with infection LP cytology without notable malignant findings Continue Keppra per neurology, tolerating well Neurosurgery - right parietal occipital craniotomy for open biopsy on 03/13/2021, tolerated procedure well  Moraxella bacteremia 1/4 samples positive. Patient was empirically started on meropenem IV.  ID consulted and have transitioned her to Ceftriaxone IV/Flagyl.  Repeat blood cultures (7/8) with no growth to date. Continue ceftriaxone/metronidazole  Hypertensive urgency, resolving Continue lisinopril HCTZ  Hypokalemia Previously repleted, repeat labs as needed  Memory impairment Seems to be related to above. Currently stable. Somewhat improving per husband at bedside   DVT prophylaxis: Heparin Code Status:   Code Status: Full Code Family Communication: Husband at bedside Disposition Plan: Home pending labs and results; possible need for future procedure given above  Consultants:  Neurology Neurosurgery PCCM  Procedures:  EEG  (7/6) IMPRESSION: This study is suggestive of moderate diffuse encephalopathy, nonspecific etiology but could be secondary to sedation. No seizures or epileptiform discharges were seen throughout the recording. EEG (7/6) IMPRESSION: This study is suggestive of cortical dysfunction in right hemisphere likely secondary to underlying structural abnormality as well as moderate diffuse encephalopathy, nonspecific etiology but could be secondary to sedation. After propofol was discontinued, encephalopathy improved. No seizures or epileptiform discharges were seen throughout the recording.  Antimicrobials: Meropenem IV Ceftriaxone IV Flagyl    Subjective: No acute issues or events overnight, patient sitting up in bedside chair tolerating breakfast without any complaints, denies nausea vomiting diarrhea constipation headache fevers or chills.  Objective: Vitals:   03/14/21 0400 03/14/21 0500 03/14/21 0600 03/14/21 0700  BP:    (!) 151/72  Pulse:   74 71  Resp:  _0 Temp: 97.9 F (36.6 C)     TempSrc: Oral     SpO2:   96% 92%  Weight:      Height:        Intake/Output Summary (Last 24 hours) at 03/14/2021 0708 Last data filed at 03/13/2021 1617 Gross per 24 hour  Intake 1000 ml  Output 430 ml  Net 570 ml    Filed Weights   03/09/21 0500 03/10/21 0447 03/13/21 0944  Weight: 84 kg 84.1 kg 84.1 kg    Examination:  General:  Pleasantly resting in bed, No acute distress. HEENT:  Sclerae nonicteric, noninjected.  Extraocular movements intact bilaterally. Neck:  Without mass or deformity.  Trachea is midline. Lungs:  Clear to auscultate bilaterally without rhonchi, wheeze, or rales. Heart:  Regular rate and rhythm.  Without murmurs, rubs, or gallops. Abdomen:  Soft, nontender, nondistended.  Without guarding or rebound. Extremities: Without cyanosis, clubbing, edema, or obvious deformity.  Data Reviewed: I  have personally reviewed following labs and imaging  studies  CBC Lab Results  Component Value Date   WBC 8.6 03/13/2021   RBC 3.85 (L) 03/13/2021   HGB 12.3 03/13/2021   HCT 38.1 03/13/2021   MCV 99.0 03/13/2021   MCH 31.9 03/13/2021   PLT 339 03/13/2021   MCHC 32.3 03/13/2021   RDW 13.9 03/13/2021   LYMPHSABS 2.4 03/07/2021   MONOABS 0.9 03/07/2021   EOSABS 0.0 03/07/2021   BASOSABS 0.0 57/32/2025     Last metabolic panel Lab Results  Component Value Date   NA 134 (L) 03/12/2021   K 3.9 03/12/2021   CL 103 03/12/2021   CO2 25 03/12/2021   BUN 9 03/12/2021   CREATININE 0.74 03/13/2021   GLUCOSE 108 (H) 03/12/2021   GFRNONAA >60 03/13/2021   CALCIUM 8.8 (L) 03/12/2021   PHOS 2.1 (L) 03/07/2021   PROT 6.4 (L) 03/07/2021   ALBUMIN 3.3 (L) 03/07/2021   BILITOT 0.9 03/07/2021   ALKPHOS 57 03/07/2021   AST 35 03/07/2021   ALT 24 03/07/2021   ANIONGAP 6 03/12/2021    CBG (last 3)  Recent Labs    03/13/21 1046 03/13/21 1339 03/13/21 2117  GLUCAP 113* 141* 229*      GFR: Estimated Creatinine Clearance: 66.4 mL/min (by C-G formula based on SCr of 0.74 mg/dL).  Coagulation Profile: Recent Labs  Lab 03/12/21 1833  INR 1.0     Recent Results (from the past 240 hour(s))  SARS CORONAVIRUS 2 (TAT 6-24 HRS) Nasopharyngeal Nasopharyngeal Swab     Status: None   Collection Time: 03/06/21  6:54 PM   Specimen: Nasopharyngeal Swab  Result Value Ref Range Status   SARS Coronavirus 2 NEGATIVE NEGATIVE Final    Comment: (NOTE) SARS-CoV-2 target nucleic acids are NOT DETECTED.  The SARS-CoV-2 RNA is generally detectable in upper and lower respiratory specimens during the acute phase of infection. Negative results do not preclude SARS-CoV-2 infection, do not rule out co-infections with other pathogens, and should not be used as the sole basis for treatment or other patient management decisions. Negative results must be combined with clinical observations, patient history, and epidemiological information. The  expected result is Negative.  Fact Sheet for Patients: SugarRoll.be  Fact Sheet for Healthcare Providers: https://www.woods-mathews.com/  This test is not yet approved or cleared by the [place] FDA and  has been authorized for detection and/or diagnosis of SARS-CoV-2 by FDA under an Emergency Use Authorization (EUA). This EUA will remain  in effect (meaning this test can be used) for the duration of the COVID-19 declaration under Se ction 564(b)(1) of the Act, 21 U.S.C. section 360bbb-3(b)(1), unless the authorization is terminated or revoked sooner.  Performed at [facility], [place] [address] [place], [place] [address]   Urine culture     Status: None   Collection Time: 03/06/21  7:32 PM   Specimen: Urine, Random  Result Value Ref Range Status   Specimen Description URINE, RANDOM  Final   Special Requests NONE  Final   Culture   Final    NO GROWTH Performed at [facility], [place] [address] [place], [place] [address]    Report Status 03/08/2021 FINAL  Final  MRSA Next Gen by PCR, Nasal     Status: None   Collection Time: 03/06/21  9:50 PM   Specimen: Nasal Mucosa; Nasal Swab  Result Value Ref Range Status   MRSA by PCR Next Gen NOT DETECTED NOT DETECTED Final  Comment: (NOTE) The GeneXpert MRSA Assay (FDA approved for NASAL specimens only), is one component of a comprehensive MRSA colonization surveillance program. It is not intended to diagnose MRSA infection nor to guide or monitor treatment for MRSA infections. Test performance is not FDA approved in patients less than 25 years old. Performed at [facility], [place] [address], [place], [place] [address]   Culture, blood (routine x 2)     Status: None   Collection Time: 03/06/21 10:35 PM   Specimen: BLOOD LEFT WRIST  Result Value Ref Range Status   Specimen Description BLOOD LEFT WRIST  Final   Special Requests   Final    BOTTLES DRAWN AEROBIC ONLY  Blood Culture results may not be optimal due to an inadequate volume of blood received in culture bottles   Culture   Final    NO GROWTH 5 DAYS Performed at [facility], [place] [address], [place], [place] [address]    Report Status 03/12/2021 FINAL  Final  Culture, blood (routine x 2)     Status: None   Collection Time: 03/06/21 10:40 PM   Specimen: BLOOD RIGHT WRIST  Result Value Ref Range Status   Specimen Description BLOOD RIGHT WRIST  Final   Special Requests   Final    BOTTLES DRAWN AEROBIC ONLY Blood Culture results may not be optimal due to an inadequate volume of blood received in culture bottles   Culture   Final    NO GROWTH 5 DAYS Performed at [facility], [place] [address], [place], [place] [address]    Report Status 03/12/2021 FINAL  Final  Culture, blood (routine x 2)     Status: Abnormal   Collection Time: 03/07/21 10:18 AM   Specimen: BLOOD LEFT ARM  Result Value Ref Range Status   Specimen Description BLOOD LEFT ARM  Final   Special Requests   Final    BOTTLES DRAWN AEROBIC ONLY Blood Culture results may not be optimal due to an inadequate volume of blood received in culture bottles   Culture  Setup Time   Final    GRAM NEGATIVE RODS AEROBIC BOTTLE ONLY Organism ID to follow CRITICAL RESULT CALLED TO, READ BACK BY AND VERIFIED WITH: G. BARR PHARMD, AT 0126 03/09/21 D. Victoriano Lain    Culture (A)  Final    MORAXELLA SPECIES BETA LACTAMASE POSITIVE Performed at [facility], [place] [address], [place], [place] [address]    Report Status 03/09/2021 FINAL  Final  Blood Culture ID Panel (Reflexed)     Status: None   Collection Time: 03/07/21 10:18 AM  Result Value Ref Range Status   Enterococcus faecalis NOT DETECTED NOT DETECTED Final   Enterococcus Faecium NOT DETECTED NOT DETECTED Final   Listeria monocytogenes NOT DETECTED NOT DETECTED Final   Staphylococcus species NOT DETECTED NOT DETECTED Final   Staphylococcus aureus (BCID) NOT DETECTED NOT  DETECTED Final   Staphylococcus epidermidis NOT DETECTED NOT DETECTED Final   Staphylococcus lugdunensis NOT DETECTED NOT DETECTED Final   Streptococcus species NOT DETECTED NOT DETECTED Final   Streptococcus agalactiae NOT DETECTED NOT DETECTED Final   Streptococcus pneumoniae NOT DETECTED NOT DETECTED Final   Streptococcus pyogenes NOT DETECTED NOT DETECTED Final   A.calcoaceticus-baumannii NOT DETECTED NOT DETECTED Final   Bacteroides fragilis NOT DETECTED NOT DETECTED Final   Enterobacterales NOT DETECTED NOT DETECTED Final   Enterobacter cloacae complex NOT DETECTED NOT DETECTED Final   Escherichia coli NOT DETECTED NOT DETECTED Final  Klebsiella aerogenes NOT DETECTED NOT DETECTED Final   Klebsiella oxytoca NOT DETECTED NOT DETECTED Final   Klebsiella pneumoniae NOT DETECTED NOT DETECTED Final   Proteus species NOT DETECTED NOT DETECTED Final   Salmonella species NOT DETECTED NOT DETECTED Final   Serratia marcescens NOT DETECTED NOT DETECTED Final   Haemophilus influenzae NOT DETECTED NOT DETECTED Final   Neisseria meningitidis NOT DETECTED NOT DETECTED Final   Pseudomonas aeruginosa NOT DETECTED NOT DETECTED Final   Stenotrophomonas maltophilia NOT DETECTED NOT DETECTED Final   Candida albicans NOT DETECTED NOT DETECTED Final   Candida auris NOT DETECTED NOT DETECTED Final   Candida glabrata NOT DETECTED NOT DETECTED Final   Candida krusei NOT DETECTED NOT DETECTED Final   Candida parapsilosis NOT DETECTED NOT DETECTED Final   Candida tropicalis NOT DETECTED NOT DETECTED Final   Cryptococcus neoformans/gattii NOT DETECTED NOT DETECTED Final    Comment: Performed at [facility], [place] [address] [place], [place] [address]  CSF culture w Gram Stain     Status: None   Collection Time: 03/07/21 10:25 AM   Specimen: CSF; Cerebrospinal Fluid  Result Value Ref Range Status   Specimen Description CSF  Final   Special Requests LUMBAR PUNCTURE  Final   Gram Stain   Final     WBC PRESENT, PREDOMINANTLY MONONUCLEAR NO ORGANISMS SEEN CYTOSPIN SMEAR    Culture   Final    NO GROWTH 3 DAYS Performed at [facility], [place] [address], [place], [place] [address]    Report Status 03/10/2021 FINAL  Final  Culture, fungus without smear     Status: None (Preliminary result)   Collection Time: 03/07/21 10:25 AM   Specimen: CSF; Cerebrospinal Fluid  Result Value Ref Range Status   Specimen Description CSF  Final   Special Requests LUMBAR PUNCTURE  Final   Culture   Final    NO FUNGUS ISOLATED AFTER 5 DAYS Performed at [facility], [place] [address] [place], [place] [address]    Report Status PENDING  Incomplete  Culture, blood (routine x 2)     Status: None   Collection Time: 03/07/21 11:09 AM   Specimen: BLOOD LEFT WRIST  Result Value Ref Range Status   Specimen Description BLOOD LEFT WRIST  Final   Special Requests   Final    BOTTLES DRAWN AEROBIC ONLY Blood Culture results may not be optimal due to an inadequate volume of blood received in culture bottles   Culture   Final    NO GROWTH 5 DAYS Performed at [facility], [place] [address] [place], [place] [address]    Report Status 03/12/2021 FINAL  Final  Culture, blood (routine x 2)     Status: None (Preliminary result)   Collection Time: 03/09/21  2:45 AM   Specimen: BLOOD LEFT FOREARM  Result Value Ref Range Status   Specimen Description BLOOD LEFT FOREARM  Final   Special Requests   Final    BOTTLES DRAWN AEROBIC AND ANAEROBIC Blood Culture adequate volume   Culture   Final    NO GROWTH 4 DAYS Performed at [facility], [place] [address] [place], [place] [address]    Report Status PENDING  Incomplete  Culture, blood (routine x 2)     Status: None (Preliminary result)   Collection Time: 03/09/21  2:55 AM   Specimen: BLOOD LEFT FOREARM  Result Value Ref Range Status   Specimen Description BLOOD LEFT FOREARM  Final  Special Requests   Final    BOTTLES DRAWN AEROBIC AND ANAEROBIC  Blood Culture adequate volume   Culture   Final    NO GROWTH 4 DAYS Performed at [facility], [place] [address], [place], [place] [address]    Report Status PENDING  Incomplete  Aerobic/Anaerobic Culture w Gram Stain (surgical/deep wound)     Status: None (Preliminary result)   Collection Time: 03/13/21  3:35 PM   Specimen: PATH Soft tissue  Result Value Ref Range Status   Specimen Description TISSUE  Final   Special Requests   Final    RIGHT PARIETAL LESION SAMPLE A Performed at [facility], [place] [address] [place], [place] [address]    Gram Stain PENDING  Incomplete   Culture PENDING  Incomplete   Report Status PENDING  Incomplete  Aerobic/Anaerobic Culture w Gram Stain (surgical/deep wound)     Status: None (Preliminary result)   Collection Time: 03/13/21  3:36 PM   Specimen: PATH Soft tissue  Result Value Ref Range Status   Specimen Description WOUND LESION  Final   Special Requests   Final    SWAB OF RT PARIETAL LESION SAMPLE D Performed at [facility], [place] [address], [place], [place] [address]    Gram Stain PENDING  Incomplete   Culture PENDING  Incomplete   Report Status PENDING  Incomplete         Radiology Studies: No results found.      Scheduled Meds:  chlorhexidine gluconate (MEDLINE KIT)  15 mL Mouth Rinse BID   Chlorhexidine Gluconate Cloth  6 each Topical Daily   docusate  100 mg Oral BID   [START ON 03/15/2021] heparin injection (subcutaneous)  5,000 Units Subcutaneous Q12H   hydrochlorothiazide  12.5 mg Oral Daily   insulin aspart  0-9 Units Subcutaneous TID WC   levETIRAcetam  1,000 mg Oral BID   lisinopril  5 mg Oral Daily   mouth rinse  15 mL Mouth Rinse BID   pantoprazole (PROTONIX) IV  40 mg Intravenous QHS   pantoprazole sodium  40 mg Oral QHS   polyethylene glycol  17 g Per Tube Daily   sodium chloride flush  3 mL Intravenous Once   Continuous Infusions:  sodium chloride 50 mL/hr at 03/13/21 1753   cefTRIAXone  (ROCEPHIN)  IV 2 g (03/14/21 0447)   metronidazole 500 mg (03/14/21 0036)     LOS: 8 days   Holli Humbles DO [facility] 03/14/2021, 7:08 AM  If 7PM-7AM, please contact night-coverage [email or web address]

## 2021-03-14 NOTE — Progress Notes (Signed)
Physical Therapy Treatment Patient Details Name: Andrea [name] MRN: [id number] DOB: 09-30-1945 Today's Date: 03/14/2021    History of Present Illness Pt is a 75 y.o. female who presented 03/06/21 with seizure like activity and L-sided weakness. ETT 7/5 - 7/6. EEG suggestive of moderate diffuse encephalopathy, nonspecific etiology but could be secondary to sedation. 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 S/p LP 7/6 with unremarkable CSF. Plan for possible biopsy 7/12. PMH: HTN.    PT Comments    Pt continues to display deficits in STM, attention, vision, and balance that place her at risk for falls and injuries. Pt becomes easily distracted and unable to follow more than 1 step commands at a time. In addition, she appears to have deficits in depth perception, needing repeated encouragement that she was no longer on the stairs as she continued to try to hesitantly step anteriorly. She continues to not see obstacles on her L side either. Focused session on facilitating scanning to the L, progressing her balance with narrow stances eyes open and closed, and improving her safety and independence with gait in distracting environments. Will continue to follow acutely. Current recommendations remain appropriate.   Follow Up Recommendations  Home health PT;Supervision/Assistance - 24 hour     Equipment Recommendations  3in1 (PT);Rolling walker with 5" wheels    Recommendations for Other Services       Precautions / Restrictions Precautions Precautions: Fall Precaution Comments: L hemianopia Restrictions Weight Bearing Restrictions: No    Mobility  Bed Mobility               General bed mobility comments:  Pt sitting up in recliner upon arrival and at end of session.    Transfers Overall transfer level: Needs assistance Equipment used: None Transfers: Sit to/from Stand Sit to Stand: Min guard         General transfer comment: Min guard assist for safety with trunk sway noted, but no LOB.  Ambulation/Gait Ambulation/Gait assistance: Min guard;Min assist Gait Distance (Feet): 330 Feet Assistive device: None;Rolling walker (2 wheeled) Gait Pattern/deviations: Step-through pattern;Drifts right/left;Trunk flexed;Decreased stride length Gait velocity: reduced Gait velocity interpretation: 1.31 - 2.62 ft/sec, indicative of limited community ambulator General Gait Details: Initially ambulating without AD, getting distracted in the hallways, causing moments of LOB needing up to minA to recover. Pt denies dizziness. Cued pt to change head positions to find objects while ambulating, but she tends to stop to look around. When using RW, she gets distal to it when distracted and bumps into obstacles on her L with no awareness, needing max cues to turn her head to find the object she is stuck on to correct herself. Improved stability with RW, but poor management and spatial awareness.   Stairs Stairs: Yes Stairs assistance: Min guard Stair Management: One rail Right;Alternating pattern;Step to pattern;Forwards Number of Stairs: 10 General stair comments: Ascends with reciprocal pattern and descends with step-to leading down with L leg, using R rail up and down for stability. Min guard assist for safety, no overt LOB. Needed max repeated cues at end of descending flight of stairs that there were no more stairs as she continued to step carefully anticipating another step and reaching for the wall.   Wheelchair Mobility    Modified Rankin (Stroke Patients Only) Modified  Rankin (Stroke Patients Only) Pre-Morbid Rankin Score: No symptoms Modified Rankin: Moderately severe disability     Balance  Overall balance assessment: Needs assistance Sitting-balance support: No upper extremity supported;Feet supported Sitting balance-Leahy Scale: Good     Standing balance support: No upper extremity supported Standing balance-Leahy Scale: Fair Standing balance comment: Pt with difficulty maintaining narrow stances without increased trunk sway or LOB. Able to maintain Rhomberg stance with eyes open with min guard and tandem stance leading with R foot for several seconds eyes open and closed with min guard. Unable to maintain tandem stance leading with L foot though.     Tandem Stance - Right Leg: 10 (seconds eyes open and closed) Tandem Stance - Left Leg: 0 (unable eyes open) Rhomberg - Eyes Opened: 10 (seconds)                  Cognition Arousal/Alertness: Awake/alert Behavior During Therapy: WFL for tasks assessed/performed Overall Cognitive Status: Impaired/Different from baseline Area of Impairment: Safety/judgement;Awareness;Problem solving;Memory;Following commands;Attention;Orientation                 Orientation Level: Disoriented to;Time (unaware of day of week and exact date in July) Current Attention Level: Sustained Memory: Decreased short-term memory Following Commands: Follows multi-step commands inconsistently;Follows one step commands with increased time Safety/Judgement: Decreased awareness of deficits;Decreased awareness of safety Awareness: Intellectual Problem Solving: Difficulty sequencing;Requires verbal cues;Slow processing General Comments: Pt with STM deficits, forgetting the time she read on the clock repeatedly during session and difficulty rembering more than 1 step at a time. Pt with poor awareness into her deficits, needing repeated rassurance she was no longer on the stairs and to look toher L to attend to objects she bumps into with her RW.      Exercises      General Comments General comments (skin integrity, edema, etc.): Educated pt's  husband on need for close guarding, memory deficits, and visual deficits      Pertinent Vitals/Pain Pain Assessment: Faces Faces Pain Scale: No hurt Pain Intervention(s): Monitored during session    Home Living                      Prior Function            PT Goals (current goals can now be found in the care plan section) Acute Rehab PT Goals Patient Stated Goal: to improve and go home PT Goal Formulation: With patient/family Time For Goal Achievement: 03/22/21 Potential to Achieve Goals: Good Progress towards PT goals: Progressing toward goals    Frequency    Min 4X/week      PT Plan Current plan remains appropriate    Co-evaluation              AM-PAC PT "6 Clicks" Mobility   Outcome Measure  Help needed turning from your back to your side while in a flat bed without using bedrails?: A Little Help needed moving from lying on your back to sitting on the side of a flat bed without using bedrails?: A Little Help needed moving to and from a bed to a chair (including a wheelchair)?: A Little Help needed standing up from a chair using your arms (e.g., wheelchair or bedside chair)?: A Little Help needed to walk in hospital room?: A Little Help needed climbing 3-5 steps with a railing? : A Little 6 Click Score: 18    End of Session Equipment Utilized During Treatment: Gait belt Activity Tolerance: Patient tolerated treatment  well Patient left: in chair;with call bell/phone within reach;with family/visitor present;Other (comment) (with MD in room) Nurse Communication: Mobility status PT Visit Diagnosis: Unsteadiness on feet (R26.81);Other abnormalities of gait and mobility (R26.89);Muscle weakness (generalized) (M62.81);Difficulty in walking, not elsewhere classified (R26.2);Other symptoms and signs involving the nervous system (R29.898);Dizziness and giddiness (R42)     Time: 4098-1191 PT Time Calculation (min) (ACUTE ONLY): 23 min  Charges:  $Gait  Training: 8-22 mins $Neuromuscular Re-education: 8-22 mins                     Moishe Spice, PT, DPT Acute Rehabilitation Services  Pager: [phone] Office: [place] 03/14/2021, 12:27 PM

## 2021-03-14 NOTE — Progress Notes (Signed)
    [facility]  Date of Admission:  03/06/2021           Reason for visit: Follow up on Moraxella bacteremia  Current antibiotics: Ceftriaxone and metronidazole 7/8--present  ASSESSMENT:    75-year-old woman admitted with seizures and brain lesions found to have Moraxella bacteremia and multiple ring-enhancing brain lesions.  She went to the OR yesterday with neurosurgery for biopsy and cultures.  Prelim path reported as concerning for neoplasm however formal read is pending.  Also some mention of pus encountered intraoperatively.  Cultures also pending.  She continues on ceftriaxone and metronidazole for now.  PLAN:    Continue ceftriaxone and metronidazole Follow-up path and micro Will follow   Principal Problem:   Seizure (HCC) Active Problems:   Gram-negative bacteremia   Leukocytosis   Brain abscess   Brain lesion    MEDICATIONS:    Scheduled Meds:  chlorhexidine gluconate (MEDLINE KIT)  15 mL Mouth Rinse BID   Chlorhexidine Gluconate Cloth  6 each Topical Daily   docusate  100 mg Oral BID   [START ON 03/15/2021] heparin injection (subcutaneous)  5,000 Units Subcutaneous Q12H   hydrochlorothiazide  12.5 mg Oral Daily   insulin aspart  0-9 Units Subcutaneous TID WC   levETIRAcetam  1,000 mg Oral BID   lisinopril  5 mg Oral Daily   mouth rinse  15 mL Mouth Rinse BID   pantoprazole (PROTONIX) IV  40 mg Intravenous QHS   pantoprazole sodium  40 mg Oral QHS   polyethylene glycol  17 g Per Tube Daily   sodium chloride flush  3 mL Intravenous Once   Continuous Infusions:  sodium chloride 50 mL/hr at 03/14/21 0800   cefTRIAXone (ROCEPHIN)  IV Stopped (03/14/21 0532)   metronidazole Stopped (03/14/21 0136)   PRN Meds:.docusate sodium, HYDROcodone-acetaminophen, labetalol, morphine injection, ondansetron **OR** ondansetron (ZOFRAN) IV, polyethylene glycol, promethazine  SUBJECTIVE:   24 hour events:  Status post OR with  neurosurgery  No new complaints.  She tolerated her surgery well.  She has no fevers or chills.  She is tolerating antibiotics.  Her pain is well controlled at this time.  Review of Systems  All other systems reviewed and are negative.    OBJECTIVE:   Blood pressure (!) 151/68, pulse 83, temperature 99.5 F (37.5 C), temperature source Axillary, resp. rate 20, height 5' 6" (1.676 m), weight 84.1 kg, SpO2 97 %. Body mass index is 29.93 kg/m.  Physical Exam Constitutional:      General: She is not in acute distress.    Appearance: Normal appearance.  HENT:     Head:     Comments: Dressing covering surgical sutures Eyes:     Extraocular Movements: Extraocular movements intact.     Conjunctiva/sclera: Conjunctivae normal.  Pulmonary:     Effort: Pulmonary effort is normal. No respiratory distress.  Musculoskeletal:        General: No swelling. Normal range of motion.     Cervical back: Normal range of motion and neck supple.  Neurological:     General: No focal deficit present.     Mental Status: She is alert and oriented to person, place, and time.  Psychiatric:        Mood and Affect: Mood normal.        Behavior: Behavior normal.     Lab Results: Lab Results  Component Value Date   WBC 8.6 03/13/2021   HGB 12.3 03/13/2021   HCT 38.1   03/13/2021   MCV 99.0 03/13/2021   PLT 339 03/13/2021    Lab Results  Component Value Date   NA 134 (L) 03/12/2021   K 3.9 03/12/2021   CO2 25 03/12/2021   GLUCOSE 108 (H) 03/12/2021   BUN 9 03/12/2021   CREATININE 0.74 03/13/2021   CALCIUM 8.8 (L) 03/12/2021   GFRNONAA >60 03/13/2021    Lab Results  Component Value Date   ALT 24 03/07/2021   AST 35 03/07/2021   ALKPHOS 57 03/07/2021   BILITOT 0.9 03/07/2021    No results found for: CRP  No results found for: ESRSEDRATE   I have reviewed the micro and lab results in Epic.  Imaging: No results found.   Imaging independently reviewed in Epic.    Raynelle Highland for Infectious Disease [facility] [phone] pager 03/14/2021, 11:03 AM

## 2021-03-14 NOTE — Progress Notes (Signed)
   Providing Compassionate, Quality Care - Together  NEUROSURGERY PROGRESS NOTE   S: No issues overnight. Doing well this am  O: EXAM:  BP (!) 151/68   Pulse 83   Temp 99.5 F (37.5 C) (Axillary)   Resp 20   Ht 5\' 6"  (1.676 m)   Wt 84.1 kg   LMP  (LMP Unknown)   SpO2 97%   BMI 29.93 kg/m   Awake, alert, oriented x3 PERRL L visual field hemianopsia Speech fluent, appropriate  CNs grossly intact  5/5 BUE/BLE  Dressing c/d/i  ASSESSMENT:  75 y.o. female with  Multiple brain lesions, concern for infection vs malignancy -s/p right parietal occipital craniotomy for open biopsy on 03/13/2021   -Prelim pathology was read as neoplasm, however multiple cultures were sent and there was note of some pus intraoperatively   PLAN: -transfer to floor/back to medicine - doing well - await cultures/path    Thank you for allowing me to participate in this patient's care.  Please do not hesitate to call with questions or concerns.   Elwin Sleight, [facility] Cell: [phone]

## 2021-03-15 DIAGNOSIS — R7881 Bacteremia: Secondary | ICD-10-CM | POA: Diagnosis not present

## 2021-03-15 LAB — MAGNESIUM: Magnesium: 1.7 mg/dL (ref 1.7–2.4)

## 2021-03-15 LAB — CBC
HCT: 33.7 % — ABNORMAL LOW (ref 36.0–46.0)
Hemoglobin: 11.1 g/dL — ABNORMAL LOW (ref 12.0–15.0)
MCH: 32.4 pg (ref 26.0–34.0)
MCHC: 32.9 g/dL (ref 30.0–36.0)
MCV: 98.3 fL (ref 80.0–100.0)
Platelets: 312 10*3/uL (ref 150–400)
RBC: 3.43 MIL/uL — ABNORMAL LOW (ref 3.87–5.11)
RDW: 14.2 % (ref 11.5–15.5)
WBC: 11.5 10*3/uL — ABNORMAL HIGH (ref 4.0–10.5)
nRBC: 0 % (ref 0.0–0.2)

## 2021-03-15 LAB — BASIC METABOLIC PANEL
Anion gap: 8 (ref 5–15)
BUN: 11 mg/dL (ref 8–23)
CO2: 26 mmol/L (ref 22–32)
Calcium: 8.8 mg/dL — ABNORMAL LOW (ref 8.9–10.3)
Chloride: 104 mmol/L (ref 98–111)
Creatinine, Ser: 0.7 mg/dL (ref 0.44–1.00)
GFR, Estimated: >60 mL/min (ref >60)
Glucose, Bld: 113 mg/dL — ABNORMAL HIGH (ref 70–99)
Potassium: 3.1 mmol/L — ABNORMAL LOW (ref 3.5–5.1)
Sodium: 138 mmol/L (ref 135–145)

## 2021-03-15 LAB — GLUCOSE, CAPILLARY
Glucose-Capillary: 95 mg/dL (ref 70–99)
Glucose-Capillary: 116 mg/dL — ABNORMAL HIGH (ref 70–99)
Glucose-Capillary: 131 mg/dL — ABNORMAL HIGH (ref 70–99)
Glucose-Capillary: 109 mg/dL — ABNORMAL HIGH (ref 70–99)

## 2021-03-15 LAB — ACID FAST SMEAR (AFB, MYCOBACTERIA): Acid Fast Smear: NEGATIVE

## 2021-03-15 MED ORDER — POTASSIUM CHLORIDE CRYS ER 20 MEQ PO TBCR
40.0000 meq | EXTENDED_RELEASE_TABLET | Freq: Once | ORAL | Status: AC
  Administered 2021-03-15: 40 meq via ORAL
  Filled 2021-03-15: qty 2

## 2021-03-15 NOTE — Progress Notes (Signed)
   Providing Compassionate, Quality Care - Together  NEUROSURGERY PROGRESS NOTE   S: No issues overnight. Ready for dc home  O: EXAM:  BP (!) 167/88 (BP Location: Left Arm)   Pulse 83   Temp 98.3 F (36.8 C) (Oral)   Resp 16   Ht 5\' 6"  (1.676 m)   Wt 73.9 kg   LMP  (LMP Unknown)   SpO2 98%   BMI 26.30 kg/m   Awake, alert, oriented x3 PERRL L visual field hemianopsia Speech fluent, appropriate CNs grossly intact 5/5 BUE/BLE  Dressing c/d/I, removed, incision c/d/i   ASSESSMENT:  75 y.o. female with Multiple brain lesions, concern for infection vs malignancy -s/p right parietal occipital craniotomy for open biopsy on 03/13/2021   -Prelim pathology was read as neoplasm, however multiple cultures were sent and there was note of some pus intraoperatively   PLAN: -cultures remain neg, await final path and culture results - ok from my standpoint for dc, will f/u in 2 weeks for staple removal - if path shows neoplasm, will discuss with Dr. Mickeal Skinner and tumor board team for further treatment      Thank you for allowing me to participate in this patient's care.  Please do not hesitate to call with questions or concerns.   Elwin Sleight, [facility] Cell: [phone]

## 2021-03-15 NOTE — Progress Notes (Signed)
PROGRESS NOTE    Andrea [name]  NZV:[id number] DOB: 1946/04/20 DOA: 03/06/2021 PCP: Celene Squibb, MD   Brief Narrative: Andrea [name] is a 75 y.o. female with a history of hypertension. [place] with concern for seizure activity. Patient was admitted to the ICU on admission. LP obtained and MRI significant for multifocal ring enhancing lesions. Keppra initiated with improvement of seizure activity. Blood culture significant for moraxella. Plan for brain biopsy.   Assessment & Plan:   Principal Problem:   Seizure ([place]) Active Problems:   Gram-negative bacteremia   Leukocytosis   Brain abscess   Brain lesion  Seizure, likely provoked in the setting of multiple ring-enhancing brain lesions POA Rule out infection Concern for neoplastic origin Neurology and neurosurgery following, appreciate insight and recommendations LP initially obtained not consistent with infection LP cytology without notable malignant findings Continue Keppra per neurology, tolerating well Neurosurgery - right parietal occipital craniotomy for open biopsy on 03/13/2021, tolerated procedure well Awaiting more definitive cultures from biopsy  Moraxella bacteremia 1/4 samples positive. Patient was empirically started on meropenem IV.  ID consulted and have transitioned her to Ceftriaxone IV/Flagyl.  Repeat blood cultures (7/8) with no growth to date. Continue ceftriaxone/metronidazole until formal cultures from brain biopsy  Hypertensive urgency, resolving Continue lisinopril HCTZ  Hypokalemia Previously repleted, repeat labs as needed  Memory impairment Seems to be related to above. Currently stable. Somewhat improving per husband at bedside   DVT prophylaxis: Heparin Code Status:   Code Status: Full Code Family Communication: Husband at bedside Disposition Plan: Home pending biopsy results in the next 24 to 48 hours as patient may need  to be discharged on IV antibiotics  Consultants:  Neurology Neurosurgery PCCM  Procedures:  EEG (7/6) IMPRESSION: This study is suggestive of moderate diffuse encephalopathy, nonspecific etiology but could be secondary to sedation. No seizures or epileptiform discharges were seen throughout the recording. EEG (7/6) IMPRESSION: This study is suggestive of cortical dysfunction in right hemisphere likely secondary to underlying structural abnormality as well as moderate diffuse encephalopathy, nonspecific etiology but could be secondary to sedation. After propofol was discontinued, encephalopathy improved. No seizures or epileptiform discharges were seen throughout the recording.  Antimicrobials: Meropenem IV Ceftriaxone IV Flagyl    Subjective: No acute issues or events overnight, patient sitting up in bedside chair tolerating breakfast without any complaints, denies nausea vomiting diarrhea constipation headache fevers or chills.  Objective: Vitals:   03/14/21 2009 03/14/21 2318 03/15/21 0354 03/15/21 0500  BP: 130/69 (!) 147/75 (!) 160/88   Pulse: 90 90 81   Resp: '16 17 17   ' Temp: 98 F (36.7 C) 97.7 F (36.5 C) 98 F (36.7 C)   TempSrc: Oral Oral    SpO2: 94% 96% 98%   Weight:    73.9 kg  Height:        Intake/Output Summary (Last 24 hours) at 03/15/2021 0718 Last data filed at 03/15/2021 0521 Gross per 24 hour  Intake 3360.06 ml  Output --  Net 3360.06 ml    Filed Weights   03/10/21 0447 03/13/21 0944 03/15/21 0500  Weight: 84.1 kg 84.1 kg 73.9 kg    Examination:  General:  Pleasantly resting in bed, No acute distress. HEENT:  Sclerae nonicteric, noninjected.  Extraocular movements intact bilaterally. Neck:  Without mass or deformity.  Trachea is midline. Lungs:  Clear to auscultate bilaterally without rhonchi, wheeze, or rales. Heart:  Regular rate and rhythm.  Without murmurs, rubs, or gallops. Abdomen:  Soft, nontender, nondistended.  Without guarding or  rebound. Extremities: Without cyanosis, clubbing, edema, or obvious deformity.  Data Reviewed: I have personally reviewed following labs and imaging studies  CBC Lab Results  Component Value Date   WBC 11.5 (H) 03/15/2021   RBC 3.43 (L) 03/15/2021   HGB 11.1 (L) 03/15/2021   HCT 33.7 (L) 03/15/2021   MCV 98.3 03/15/2021   MCH 32.4 03/15/2021   PLT 312 03/15/2021   MCHC 32.9 03/15/2021   RDW 14.2 03/15/2021   LYMPHSABS 2.4 03/07/2021   MONOABS 0.9 03/07/2021   EOSABS 0.0 03/07/2021   BASOSABS 0.0 65/46/5035     Last metabolic panel Lab Results  Component Value Date   NA 138 03/15/2021   K 3.1 (L) 03/15/2021   CL 104 03/15/2021   CO2 26 03/15/2021   BUN 11 03/15/2021   CREATININE 0.70 03/15/2021   GLUCOSE 113 (H) 03/15/2021   GFRNONAA >60 03/15/2021   CALCIUM 8.8 (L) 03/15/2021   PHOS 2.1 (L) 03/07/2021   PROT 6.4 (L) 03/07/2021   ALBUMIN 3.3 (L) 03/07/2021   BILITOT 0.9 03/07/2021   ALKPHOS 57 03/07/2021   AST 35 03/07/2021   ALT 24 03/07/2021   ANIONGAP 8 03/15/2021    CBG (last 3)  Recent Labs    03/14/21 1606 03/14/21 2125 03/15/21 0631  GLUCAP 130* 117* 95      GFR: Estimated Creatinine Clearance: 62.4 mL/min (by C-G formula based on SCr of 0.7 mg/dL).  Coagulation Profile: Recent Labs  Lab 03/12/21 1833  INR 1.0     Recent Results (from the past 240 hour(s))  SARS CORONAVIRUS 2 (TAT 6-24 HRS) Nasopharyngeal Nasopharyngeal Swab     Status: None   Collection Time: 03/06/21  6:54 PM   Specimen: Nasopharyngeal Swab  Result Value Ref Range Status   SARS Coronavirus 2 NEGATIVE NEGATIVE Final    Comment: (NOTE) SARS-CoV-2 target nucleic acids are NOT DETECTED.  The SARS-CoV-2 RNA is generally detectable in upper and lower respiratory specimens during the acute phase of infection. Negative results do not preclude SARS-CoV-2 infection, do not rule out co-infections with other pathogens, and should not be used as the sole basis for treatment  or other patient management decisions. Negative results must be combined with clinical observations, patient history, and epidemiological information. The expected result is Negative.  Fact Sheet for Patients: SugarRoll.be  Fact Sheet for Healthcare Providers: https://www.woods-mathews.com/  This test is not yet approved or cleared by the [place] FDA and  has been authorized for detection and/or diagnosis of SARS-CoV-2 by FDA under an Emergency Use Authorization (EUA). This EUA will remain  in effect (meaning this test can be used) for the duration of the COVID-19 declaration under Se ction 564(b)(1) of the Act, 21 U.S.C. section 360bbb-3(b)(1), unless the authorization is terminated or revoked sooner.  Performed at [facility], [place] [address], [place], [place] [address]   Urine culture     Status: None   Collection Time: 03/06/21  7:32 PM   Specimen: Urine, Random  Result Value Ref Range Status   Specimen Description URINE, RANDOM  Final   Special Requests NONE  Final   Culture   Final    NO GROWTH Performed at [facility], [place] [address] [place], [place] [address]    Report Status 03/08/2021 FINAL  Final  MRSA Next Gen by PCR, Nasal     Status: None   Collection Time:  03/06/21  9:50 PM   Specimen: Nasal Mucosa; Nasal Swab  Result Value Ref Range Status   MRSA by PCR Next Gen NOT DETECTED NOT DETECTED Final    Comment: (NOTE) The GeneXpert MRSA Assay (FDA approved for NASAL specimens only), is one component of a comprehensive MRSA colonization surveillance program. It is not intended to diagnose MRSA infection nor to guide or monitor treatment for MRSA infections. Test performance is not FDA approved in patients less than 69 years old. Performed at [facility], [place] [address], [place], [place] [address]   Culture, blood (routine x 2)     Status: None   Collection Time: 03/06/21 10:35 PM   Specimen:  BLOOD LEFT WRIST  Result Value Ref Range Status   Specimen Description BLOOD LEFT WRIST  Final   Special Requests   Final    BOTTLES DRAWN AEROBIC ONLY Blood Culture results may not be optimal due to an inadequate volume of blood received in culture bottles   Culture   Final    NO GROWTH 5 DAYS Performed at [facility], [place] [address], [place], [place] [address]    Report Status 03/12/2021 FINAL  Final  Culture, blood (routine x 2)     Status: None   Collection Time: 03/06/21 10:40 PM   Specimen: BLOOD RIGHT WRIST  Result Value Ref Range Status   Specimen Description BLOOD RIGHT WRIST  Final   Special Requests   Final    BOTTLES DRAWN AEROBIC ONLY Blood Culture results may not be optimal due to an inadequate volume of blood received in culture bottles   Culture   Final    NO GROWTH 5 DAYS Performed at [facility], [place] [address], [place], [place] [address]    Report Status 03/12/2021 FINAL  Final  Culture, blood (routine x 2)     Status: Abnormal   Collection Time: 03/07/21 10:18 AM   Specimen: BLOOD LEFT ARM  Result Value Ref Range Status   Specimen Description BLOOD LEFT ARM  Final   Special Requests   Final    BOTTLES DRAWN AEROBIC ONLY Blood Culture results may not be optimal due to an inadequate volume of blood received in culture bottles   Culture  Setup Time   Final    GRAM NEGATIVE RODS AEROBIC BOTTLE ONLY Organism ID to follow CRITICAL RESULT CALLED TO, READ BACK BY AND VERIFIED WITH: G. BARR PHARMD, AT 0126 03/09/21 D. Victoriano Lain    Culture (A)  Final    MORAXELLA SPECIES BETA LACTAMASE POSITIVE Performed at [facility], [place] [address] [place], [place] [address]    Report Status 03/09/2021 FINAL  Final  Blood Culture ID Panel (Reflexed)     Status: None   Collection Time: 03/07/21 10:18 AM  Result Value Ref Range Status   Enterococcus faecalis NOT DETECTED NOT DETECTED Final   Enterococcus Faecium NOT DETECTED NOT DETECTED Final   Listeria  monocytogenes NOT DETECTED NOT DETECTED Final   Staphylococcus species NOT DETECTED NOT DETECTED Final   Staphylococcus aureus (BCID) NOT DETECTED NOT DETECTED Final   Staphylococcus epidermidis NOT DETECTED NOT DETECTED Final   Staphylococcus lugdunensis NOT DETECTED NOT DETECTED Final   Streptococcus species NOT DETECTED NOT DETECTED Final   Streptococcus agalactiae NOT DETECTED NOT DETECTED Final   Streptococcus pneumoniae NOT DETECTED NOT DETECTED Final   Streptococcus pyogenes NOT DETECTED NOT DETECTED Final   A.calcoaceticus-baumannii NOT DETECTED NOT DETECTED Final   Bacteroides fragilis  NOT DETECTED NOT DETECTED Final   Enterobacterales NOT DETECTED NOT DETECTED Final   Enterobacter cloacae complex NOT DETECTED NOT DETECTED Final   Escherichia coli NOT DETECTED NOT DETECTED Final   Klebsiella aerogenes NOT DETECTED NOT DETECTED Final   Klebsiella oxytoca NOT DETECTED NOT DETECTED Final   Klebsiella pneumoniae NOT DETECTED NOT DETECTED Final   Proteus species NOT DETECTED NOT DETECTED Final   Salmonella species NOT DETECTED NOT DETECTED Final   Serratia marcescens NOT DETECTED NOT DETECTED Final   Haemophilus influenzae NOT DETECTED NOT DETECTED Final   Neisseria meningitidis NOT DETECTED NOT DETECTED Final   Pseudomonas aeruginosa NOT DETECTED NOT DETECTED Final   Stenotrophomonas maltophilia NOT DETECTED NOT DETECTED Final   Candida albicans NOT DETECTED NOT DETECTED Final   Candida auris NOT DETECTED NOT DETECTED Final   Candida glabrata NOT DETECTED NOT DETECTED Final   Candida krusei NOT DETECTED NOT DETECTED Final   Candida parapsilosis NOT DETECTED NOT DETECTED Final   Candida tropicalis NOT DETECTED NOT DETECTED Final   Cryptococcus neoformans/gattii NOT DETECTED NOT DETECTED Final    Comment: Performed at [facility], [place] [address], [place], [place] [address]  CSF culture w Gram Stain     Status: None   Collection Time: 03/07/21 10:25 AM   Specimen: CSF;  Cerebrospinal Fluid  Result Value Ref Range Status   Specimen Description CSF  Final   Special Requests LUMBAR PUNCTURE  Final   Gram Stain   Final    WBC PRESENT, PREDOMINANTLY MONONUCLEAR NO ORGANISMS SEEN CYTOSPIN SMEAR    Culture   Final    NO GROWTH 3 DAYS Performed at [facility], [place] [address] [place], [place] [address]    Report Status 03/10/2021 FINAL  Final  Culture, fungus without smear     Status: None (Preliminary result)   Collection Time: 03/07/21 10:25 AM   Specimen: CSF; Cerebrospinal Fluid  Result Value Ref Range Status   Specimen Description CSF  Final   Special Requests LUMBAR PUNCTURE  Final   Culture   Final    NO FUNGUS ISOLATED AFTER 6 DAYS Performed at [facility], [place] [address], [place], [place] [address]    Report Status PENDING  Incomplete  Culture, blood (routine x 2)     Status: None   Collection Time: 03/07/21 11:09 AM   Specimen: BLOOD LEFT WRIST  Result Value Ref Range Status   Specimen Description BLOOD LEFT WRIST  Final   Special Requests   Final    BOTTLES DRAWN AEROBIC ONLY Blood Culture results may not be optimal due to an inadequate volume of blood received in culture bottles   Culture   Final    NO GROWTH 5 DAYS Performed at [facility], [place] [address] [place], [place] [address]    Report Status 03/12/2021 FINAL  Final  Culture, blood (routine x 2)     Status: None   Collection Time: 03/09/21  2:45 AM   Specimen: BLOOD LEFT FOREARM  Result Value Ref Range Status   Specimen Description BLOOD LEFT FOREARM  Final   Special Requests   Final    BOTTLES DRAWN AEROBIC AND ANAEROBIC Blood Culture adequate volume   Culture   Final    NO GROWTH 5 DAYS Performed at [facility], [place] [address], [place], [place] [address]    Report Status 03/14/2021 FINAL  Final  Culture, blood (routine x 2)     Status:  None   Collection Time: 03/09/21  2:55 AM   Specimen: BLOOD LEFT FOREARM  Result Value Ref Range Status    Specimen Description BLOOD LEFT FOREARM  Final   Special Requests   Final    BOTTLES DRAWN AEROBIC AND ANAEROBIC Blood Culture adequate volume   Culture   Final    NO GROWTH 5 DAYS Performed at [facility], [address] [place], [place] [address]    Report Status 03/14/2021 FINAL  Final  Aerobic/Anaerobic Culture w Gram Stain (surgical/deep wound)     Status: None (Preliminary result)   Collection Time: 03/13/21  3:35 PM   Specimen: PATH Soft tissue  Result Value Ref Range Status   Specimen Description TISSUE  Final   Special Requests RIGHT PARIETAL LESION SAMPLE A  Final   Gram Stain   Final    RARE WBC PRESENT, PREDOMINANTLY MONONUCLEAR NO ORGANISMS SEEN    Culture   Final    NO GROWTH < 12 HOURS Performed at [facility], [place] [address], [place], [place] [address]    Report Status PENDING  Incomplete  Aerobic/Anaerobic Culture w Gram Stain (surgical/deep wound)     Status: None (Preliminary result)   Collection Time: 03/13/21  3:36 PM   Specimen: PATH Soft tissue  Result Value Ref Range Status   Specimen Description WOUND LESION  Final   Special Requests SWAB OF RT PARIETAL LESION SAMPLE D  Final   Gram Stain   Final    RARE WBC PRESENT,BOTH PMN AND MONONUCLEAR NO ORGANISMS SEEN    Culture   Final    NO GROWTH < 12 HOURS Performed at [facility], [place] [address] [place], [place] [address]    Report Status PENDING  Incomplete  Acid Fast Smear (AFB)     Status: None   Collection Time: 03/13/21  3:36 PM   Specimen: PATH Soft tissue  Result Value Ref Range Status   AFB Specimen Processing Comment  Final    Comment: Tissue Grinding and Digestion/Decontamination   Acid Fast Smear Negative  Final    Comment: (NOTE) Performed At: [facility] [place], [place] [id number] Rush Farmer MD JG:[id number]    Source (AFB) TISSUE  Final    Comment: RT PARIETAL LESION ,SAMPLE C Performed at [facility], [place] [address], [place], [place] [address]          Radiology Studies: No results found.      Scheduled Meds:  chlorhexidine gluconate (MEDLINE KIT)  15 mL Mouth Rinse BID   Chlorhexidine Gluconate Cloth  6 each Topical Daily   docusate  100 mg Oral BID   heparin injection (subcutaneous)  5,000 Units Subcutaneous Q12H   hydrochlorothiazide  12.5 mg Oral Daily   insulin aspart  0-9 Units Subcutaneous TID WC   levETIRAcetam  1,000 mg Oral BID   lisinopril  5 mg Oral Daily   mouth rinse  15 mL Mouth Rinse BID   pantoprazole  40 mg Oral QHS   polyethylene glycol  17 g Per Tube Daily   sodium chloride flush  3 mL Intravenous Once   Continuous Infusions:  sodium chloride 50 mL/hr at 03/15/21 0521   cefTRIAXone (ROCEPHIN)  IV 2 g (03/15/21 0521)   metronidazole Stopped (03/15/21 0154)     LOS: 9 days   Holli Humbles DO [facility] 03/15/2021, 7:18 AM  If 7PM-7AM, please contact night-coverage [email or web address]

## 2021-03-15 NOTE — Progress Notes (Signed)
OT Cancellation Note  Patient Details Name: Andrea [name] MRN: [id number] DOB: Mar 07, 1946   Cancelled Treatment:    Reason Eval/Treat Not Completed: Other (comment). Pt's lunch tray just arrived. Plan to re-attempt in a bit.   Tyrone Schimke, OT Acute Rehabilitation Services Pager: [phone] Office: [phone]  03/15/2021, 1:23 PM

## 2021-03-15 NOTE — Progress Notes (Signed)
Occupational Therapy Treatment Patient Details Name: Andrea [name] MRN: [id number] DOB: February 25, 1946 Today's Date: 03/15/2021    History of present illness Pt is a 75 y.o. female who presented 03/06/21 with seizure like activity and L-sided weakness. ETT 7/5 - 7/6. EEG suggestive of moderate diffuse encephalopathy, nonspecific etiology but could be secondary to sedation. 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 S/p LP 7/6 with unremarkable CSF. Plan for possible biopsy 7/12. PMH: HTN.   OT comments  Pt progressing towards acute OT goals. Continued working on strategies for safety with ADLs and functional transfers/mobility. Visual and cognitive deficits remain. Limited carryover. Pt pleasant and conversational. STM deficits noted. Provided printout of vision education for pt and caregiver use. D/c plan remains appropriate.    Follow Up Recommendations  Home health OT;Supervision/Assistance - 24 hour    Equipment Recommendations  3 in 1 bedside commode    Recommendations for Other Services      Precautions / Restrictions Precautions Precautions: Fall Precaution Comments: L hemianopia Restrictions Weight Bearing Restrictions: No       Mobility Bed Mobility Overal bed mobility: Needs Assistance Bed Mobility: Supine to Sit     Supine to sit: Supervision     General bed mobility comments: up in recliner    Transfers Overall transfer level: Needs assistance Equipment used: Rolling walker (2 wheeled) Transfers: Sit to/from Stand Sit to Stand: Min guard         General transfer comment: for safety    Balance Overall balance assessment: Needs assistance Sitting-balance support: No upper  extremity supported;Feet supported Sitting balance-Leahy Scale: Good     Standing balance support: No upper extremity supported Standing balance-Leahy Scale: Fair Standing balance comment: Benefits from UE support for steadying, intermittent LOB needing up to minA to recover.                           ADL either performed or assessed with clinical judgement   ADL Overall ADL's : Needs assistance/impaired                         Toilet Transfer: Minimal assistance;Ambulation;RW;Regular Toilet;Grab bars Toilet Transfer Details (indicate cue type and reason): cues for safety with technique ad Toileting- Clothing Manipulation and Hygiene: Minimal assistance       Functional mobility during ADLs: Minimal assistance;Rolling walker General ADL Comments: assist 2/2 vision impairment and decreased carryover of strategies. Provided educational handouts on visual field loss and strategies for ADLs.     Vision   Vision Assessment?: Vision impaired- to be further tested in functional context Additional Comments: left hemianopsia fluctuating at times, due to cognitive limitations unsure extent of pt's deficits.   Perception     Praxis      Cognition Arousal/Alertness: Awake/alert Behavior During Therapy: WFL for tasks assessed/performed Overall Cognitive Status: Impaired/Different from baseline Area of Impairment: Safety/judgement;Awareness;Problem solving;Memory;Following commands;Attention                   Current Attention Level: Sustained Memory: Decreased short-term memory Following Commands: Follows multi-step commands inconsistently;Follows one step commands with increased time Safety/Judgement: Decreased awareness of deficits;Decreased awareness of safety Awareness: Emergent Problem Solving: Difficulty sequencing;Requires verbal cues;Slow processing General Comments: Pt with visual and  congnitive deficits observed. Impaired STM. Asking for location  of bathroom in hospital room. Path finding difficulty        Exercises     Shoulder Instructions       General Comments      Pertinent Vitals/ Pain       Pain Assessment: Faces Faces Pain Scale: No hurt Pain Intervention(s): Monitored during session  Home Living                                          Prior Functioning/Environment              Frequency  Min 2X/week        Progress Toward Goals  OT Goals(current goals can now be found in the care plan section)  Progress towards OT goals: Progressing toward goals  Acute Rehab OT Goals Patient Stated Goal: to improve and go home OT Goal Formulation: With patient Time For Goal Achievement: 03/23/21 Potential to Achieve Goals: Good ADL Goals Pt Will Perform Grooming: with supervision;standing Pt Will Perform Upper Body Bathing: with supervision;sitting Pt Will Perform Lower Body Bathing: with supervision;sitting/lateral leans Pt Will Transfer to Toilet: with supervision;ambulating;regular height toilet Pt Will Perform Toileting - Clothing Manipulation and hygiene: with supervision;sit to/from stand Pt Will Perform Tub/Shower Transfer: with supervision;ambulating;3 in 1 Additional ADL Goal #1: Pt will tolerate formal vision testing.  Plan Discharge plan remains appropriate    Co-evaluation                 AM-PAC OT "6 Clicks" Daily Activity     Outcome Measure   Help from another person eating meals?: A Little Help from another person taking care of personal grooming?: A Little Help from another person toileting, which includes using toliet, bedpan, or urinal?: A Little Help from another person bathing (including washing, rinsing, drying)?: A Little Help from another person to put on and taking off regular upper body clothing?: A Little Help from another person to put on and taking off regular lower body clothing?: A Little 6 Click Score: 18    End of Session Equipment Utilized  During Treatment: Rolling walker  OT Visit Diagnosis: Unsteadiness on feet (R26.81);Other abnormalities of gait and mobility (R26.89);Other symptoms and signs involving cognitive function;Low vision, both eyes (H54.2)   Activity Tolerance Patient tolerated treatment well   Patient Left in chair;with call bell/phone within reach;with chair alarm set   Nurse Communication          Time: 7342-8768 OT Time Calculation (min): 27 min  Charges: OT General Charges $OT Visit: 1 Visit OT Treatments $Self Care/Home Management : 23-37 mins  Tyrone Schimke, OT Acute Rehabilitation Services Pager: [phone] Office: [phone]    Hortencia Pilar 03/15/2021, 2:32 PM

## 2021-03-15 NOTE — Progress Notes (Signed)
Physical Therapy Treatment Patient Details Name: Andrea [name] MRN: [id number] DOB: 1946/06/11 Today's Date: 03/15/2021    History of Present Illness Pt is a 75 y.o. female who presented 03/06/21 with seizure like activity and L-sided weakness. ETT 7/5 - 7/6. EEG suggestive of moderate diffuse encephalopathy, nonspecific etiology but could be secondary to sedation. 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 S/p LP 7/6 with unremarkable CSF. Plan for possible biopsy 7/12. PMH: HTN.    PT Comments    Pt continues to try to attribute her memory and visual deficits to other causes, indicating poor awareness of the situation and her resulting deficits. Focused session on advancing mobility while educating pt on her deficits and how to compensate for his visual deficits through visual scanning. Pt tends to not see objects particularly inferiorly on her L side. Pt able to manage RW safely in open spaces, but tends to display poor RW management in tight spaces. She may benefit from close guarding without an AD when negotiating tight spaces unless she can display good carryover of proper RW management in these situations. Will continue to follow acutely. Current recommendations remain appropriate.    Follow Up Recommendations  Home health PT;Supervision/Assistance - 24 hour     Equipment Recommendations  3in1 (PT);Rolling walker with 5" wheels    Recommendations for Other Services       Precautions / Restrictions Precautions Precautions: Fall Precaution Comments: L hemianopia Restrictions Weight Bearing Restrictions: No    Mobility  Bed Mobility Overal bed mobility: Needs Assistance Bed Mobility: Supine to Sit      Supine to sit: Supervision     General bed mobility comments: Bed flat, supervision for safety with pt using rails.    Transfers Overall transfer level: Needs assistance Equipment used: Rolling walker (2 wheeled) Transfers: Sit to/from Stand Sit to Stand: Min guard         General transfer comment: Min guard assist for safety with sit to stand 1x from EOB and 1x from toilet.  Ambulation/Gait Ambulation/Gait assistance: Min guard;Min assist Gait Distance (Feet):  (x2 bouts of ~12 ft > ~300 ft) Assistive device: Rolling walker (2 wheeled) Gait Pattern/deviations: Step-through pattern;Drifts right/left;Trunk flexed;Decreased stride length Gait velocity: reduced Gait velocity interpretation: 1.31 - 2.62 ft/sec, indicative of limited community ambulator General Gait Details: Pt maintaining good body position in relation to RW when in open spaces, but reverts back to pushing it distally or bumping into obstacles on her L in tight spaces, needing repeated cues to scan to her L and inferiorly to avoid obstacles. Min guard assist for safety with gait in hallway, x1 LOB in room needing minA to recover. While ambulating in hallway, encouraged visual scanning through placing her stuffed animal dog in various locations/heights on her L. Pt with particular difficulty finding dog when to her L and inferior to her. Pt stops when scanning.   Stairs             Wheelchair Mobility    Modified Rankin (Stroke Patients Only) Modified Rankin (Stroke Patients Only) Pre-Morbid Rankin Score: No symptoms Modified Rankin: Moderately severe disability     Balance Overall balance assessment: Needs assistance Sitting-balance support: No upper extremity supported;Feet supported Sitting balance-Leahy Scale: Good     Standing balance support: No upper extremity supported  Standing balance-Leahy Scale: Fair Standing balance comment: Benefits from UE support for steadying, intermittent LOB needing  up to minA to recover.                            Cognition Arousal/Alertness: Awake/alert Behavior During Therapy: WFL for tasks assessed/performed Overall Cognitive Status: Impaired/Different from baseline Area of Impairment: Safety/judgement;Awareness;Problem solving;Memory;Following commands;Attention                   Current Attention Level: Sustained Memory: Decreased short-term memory Following Commands: Follows multi-step commands inconsistently;Follows one step commands with increased time Safety/Judgement: Decreased awareness of deficits;Decreased awareness of safety Awareness: Emergent Problem Solving: Difficulty sequencing;Requires verbal cues;Slow processing General Comments: Pt with STM deficits and trying to attribute it to being because she normally does not remember some of these things or would try to change subjects. Pt appears to be in denial of her memory or visual deficits, asking "where is the toilet paper" while reaching posterior to her even though she watched and helped get toilet paper 3x prior from dispenser on L side of wall. Repeated cues needed to educate pt to perform lighthouse scanning L<> R and combine superior <> inferior scanning to find objects to maintain safety. Poor carryover of good RW management when in tight spaces. Able to problem-solve using signs to help find her room when provided room number.      Exercises      General Comments        Pertinent Vitals/Pain Pain Assessment: Faces Faces Pain Scale: No hurt Pain Intervention(s): Monitored during session    Home Living                      Prior Function            PT Goals (current goals can now be found in the care plan section) Acute Rehab PT Goals Patient Stated Goal: to improve and go home PT Goal Formulation: With patient Time For Goal Achievement: 03/22/21 Potential to Achieve Goals: Good Progress towards PT goals: Progressing toward goals     Frequency    Min 4X/week      PT Plan Current plan remains appropriate    Co-evaluation              AM-PAC PT "6 Clicks" Mobility   Outcome Measure  Help needed turning from your back to your side while in a flat bed without using bedrails?: A Little Help needed moving from lying on your back to sitting on the side of a flat bed without using bedrails?: A Little Help needed moving to and from a bed to a chair (including a wheelchair)?: A Little Help needed standing up from a chair using your arms (e.g., wheelchair or bedside chair)?: A Little Help needed to walk in hospital room?: A Little Help needed climbing 3-5 steps with a railing? : A Little 6 Click Score: 18    End of Session Equipment Utilized During Treatment: Gait belt Activity Tolerance: Patient tolerated treatment well Patient left: in chair;with call bell/phone within reach;with chair alarm set;with nursing/sitter in room Nurse Communication: Mobility status PT Visit Diagnosis: Unsteadiness on feet (R26.81);Other abnormalities of gait and mobility (R26.89);Muscle weakness (generalized) (M62.81);Difficulty in walking, not elsewhere classified (R26.2);Other symptoms and signs involving the nervous system (R29.898);Dizziness and giddiness (R42)     Time: 6606-3016 PT Time Calculation (min) (ACUTE ONLY): 36 min  Charges:  $Gait Training: 8-22  mins $Therapeutic Activity: 8-22 mins                     Moishe Spice, PT, DPT Acute Rehabilitation Services  Pager: [phone] Office: [place] 03/15/2021, 12:56 PM

## 2021-03-16 ENCOUNTER — Inpatient Hospital Stay: Payer: Self-pay

## 2021-03-16 DIAGNOSIS — R569 Unspecified convulsions: Secondary | ICD-10-CM | POA: Diagnosis not present

## 2021-03-16 DIAGNOSIS — R7881 Bacteremia: Secondary | ICD-10-CM | POA: Diagnosis not present

## 2021-03-16 DIAGNOSIS — G939 Disorder of brain, unspecified: Secondary | ICD-10-CM | POA: Diagnosis not present

## 2021-03-16 DIAGNOSIS — G060 Intracranial abscess and granuloma: Secondary | ICD-10-CM | POA: Diagnosis not present

## 2021-03-16 LAB — GLUCOSE, CAPILLARY
Glucose-Capillary: 96 mg/dL (ref 70–99)
Glucose-Capillary: 102 mg/dL — ABNORMAL HIGH (ref 70–99)
Glucose-Capillary: 175 mg/dL — ABNORMAL HIGH (ref 70–99)
Glucose-Capillary: 156 mg/dL — ABNORMAL HIGH (ref 70–99)

## 2021-03-16 MED ORDER — POLYETHYLENE GLYCOL 3350 17 G PO PACK
17.0000 g | PACK | Freq: Every day | ORAL | Status: DC
  Filled 2021-03-16: qty 1

## 2021-03-16 MED ORDER — CEFTRIAXONE IV (FOR PTA / DISCHARGE USE ONLY): 2.0000 g | Freq: Two times a day (BID) | INTRAVENOUS | 0 refills | Status: AC

## 2021-03-16 MED ORDER — METRONIDAZOLE 500 MG/100ML IV SOLN
500.0000 mg | Freq: Three times a day (TID) | INTRAVENOUS | Status: DC
  Administered 2021-03-16 – 2021-03-17 (×3): 500 mg via INTRAVENOUS
  Filled 2021-03-16 (×2): qty 100

## 2021-03-16 MED ORDER — SODIUM CHLORIDE 0.9% FLUSH
10.0000 mL | Freq: Two times a day (BID) | INTRAVENOUS | Status: DC
Start: 2021-03-16 — End: 2021-03-17
  Administered 2021-03-16 – 2021-03-17 (×2): 10 mL

## 2021-03-16 MED ORDER — LEVETIRACETAM 1000 MG PO TABS: 1000.0000 mg | ORAL_TABLET | Freq: Two times a day (BID) | ORAL | 0 refills | Status: DC

## 2021-03-16 MED ORDER — SODIUM CHLORIDE 0.9 % IV SOLN
2.0000 g | Freq: Two times a day (BID) | INTRAVENOUS | Status: DC
  Administered 2021-03-16 – 2021-03-17 (×3): 2 g via INTRAVENOUS
  Filled 2021-03-16 (×4): qty 20

## 2021-03-16 MED ORDER — PANTOPRAZOLE SODIUM 40 MG PO TBEC: 40.0000 mg | DELAYED_RELEASE_TABLET | Freq: Every day | ORAL | 0 refills | Status: DC

## 2021-03-16 MED ORDER — SODIUM CHLORIDE 0.9% FLUSH: 10.0000 mL | INTRAVENOUS | Status: DC | PRN

## 2021-03-16 MED ORDER — METRONIDAZOLE 500 MG PO TABS: 500.0000 mg | ORAL_TABLET | Freq: Three times a day (TID) | ORAL | 0 refills | Status: AC

## 2021-03-16 NOTE — Progress Notes (Addendum)
PHARMACY CONSULT NOTE FOR:  OUTPATIENT  PARENTERAL ANTIBIOTIC THERAPY (OPAT)  Indication: Possible brain abscess Regimen: ceftriaxone 2g IV q12h AND PO flagyl 500mg  TID  End date: 04/24/2021  IV antibiotic discharge orders are pended. To discharging provider:  please sign these orders via discharge navigator,  Select New Orders & click on the button choice - Manage This Unsigned Work.     Thank you for allowing pharmacy to be a part of this patient's care.  Phillis Haggis 03/16/2021, 1:47 PM

## 2021-03-16 NOTE — Progress Notes (Signed)
Physical Therapy Treatment Patient Details Name: Andrea [name] MRN: [id number] DOB: Feb 24, 1946 Today's Date: 03/16/2021    History of Present Illness Pt is a 75 y.o. female who presented 03/06/21 with seizure like activity and L-sided weakness. ETT 7/5 - 7/6. EEG suggestive of moderate diffuse encephalopathy, nonspecific etiology but could be secondary to sedation. 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 S/p LP 7/6 with unremarkable CSF. Plan for possible biopsy 7/12. PMH: HTN.    PT Comments    Patient progressing towards physical therapy goals. Patient ambulated 200' with supervision and RW, good carryover from previous session for RW management. Cues required for scanning L environment to avoid obstacles. Patient with decreased attention requiring cues for redirection and attending to tasks. Tends to use humor to hide cognitive deficits. Continue to recommend HHPT following discharge to maximize functional independence and safety in the home.     Follow Up Recommendations  Home health PT;Supervision/Assistance - 24 hour     Equipment Recommendations  3in1 (PT);Rolling Liani Caris with 5" wheels    Recommendations for Other Services       Precautions / Restrictions Precautions Precautions: Fall Precaution Comments: L hemianopia Restrictions Weight Bearing Restrictions: No    Mobility  Bed Mobility Overal bed mobility: Needs Assistance Bed Mobility: Supine to Sit;Sit to Supine     Supine to sit: Supervision Sit to supine: Supervision   General bed mobility comments: supervision for safety    Transfers Overall transfer level: Needs assistance Equipment used: Rolling Areeb Corron (2 wheeled) Transfers:  Sit to/from Stand Sit to Stand: Supervision         General transfer comment: supervision for safety. Sit to stand from EOB x 2 and from low toilet  Ambulation/Gait Ambulation/Gait assistance: Min guard;Supervision Gait Distance (Feet): 200 Feet Assistive device: Rolling Eadie Repetto (2 wheeled) Gait Pattern/deviations: Step-through pattern;Drifts right/left;Trunk flexed;Decreased stride length Gait velocity: reduced   General Gait Details: initially min guard for safety in room navigation progressing to supervision level in hallway. Patient with good carryover from previous session about RW management during ambulation. Cues for scanning L/R for environment which seems improved since last session.   Stairs             Wheelchair Mobility    Modified Rankin (Stroke Patients Only)       Balance Overall balance assessment: Needs assistance Sitting-balance support: No upper extremity supported;Feet supported Sitting balance-Leahy Scale: Good     Standing balance support: Bilateral upper extremity supported;During functional activity Standing balance-Leahy Scale: Poor Standing balance comment: reliant on UE support                            Cognition Arousal/Alertness: Awake/alert Behavior During Therapy: WFL for tasks assessed/performed Overall Cognitive Status: Impaired/Different from baseline Area of Impairment: Safety/judgement;Awareness;Problem solving;Memory;Following commands;Attention                   Current Attention Level: Sustained Memory: Decreased short-term memory Following Commands: Follows multi-step commands inconsistently;Follows one step commands with increased time Safety/Judgement: Decreased awareness of deficits;Decreased awareness of safety Awareness: Emergent Problem Solving: Difficulty sequencing;Requires verbal cues;Slow processing General Comments: Required cues for path finding in hallway even with provided visual images  to utilize to decide path to return to room.  STM deficits noted with difficulty remembering room number when provided.      Exercises      General Comments        Pertinent Vitals/Pain Pain Assessment: No/denies pain    Home Living                      Prior Function            PT Goals (current goals can now be found in the care plan section) Acute Rehab PT Goals Patient Stated Goal: to go home PT Goal Formulation: With patient Time For Goal Achievement: 03/22/21 Potential to Achieve Goals: Good Progress towards PT goals: Progressing toward goals    Frequency    Min 4X/week      PT Plan Current plan remains appropriate    Co-evaluation              AM-PAC PT "6 Clicks" Mobility   Outcome Measure  Help needed turning from your back to your side while in a flat bed without using bedrails?: A Little Help needed moving from lying on your back to sitting on the side of a flat bed without using bedrails?: A Little Help needed moving to and from a bed to a chair (including a wheelchair)?: A Little Help needed standing up from a chair using your arms (e.g., wheelchair or bedside chair)?: A Little Help needed to walk in hospital room?: A Little Help needed climbing 3-5 steps with a railing? : A Little 6 Click Score: 18    End of Session Equipment Utilized During Treatment: Gait belt Activity Tolerance: Patient tolerated treatment well Patient left: in bed;with call bell/phone within reach;with bed alarm set Nurse Communication: Mobility status PT Visit Diagnosis: Unsteadiness on feet (R26.81);Other abnormalities of gait and mobility (R26.89);Muscle weakness (generalized) (M62.81);Difficulty in walking, not elsewhere classified (R26.2);Other symptoms and signs involving the nervous system (R29.898);Dizziness and giddiness (R42)     Time: 1534-1600 PT Time Calculation (min) (ACUTE ONLY): 26 min  Charges:  $Gait Training: 23-37 mins                      Judaea Burgoon A. Gilford Rile PT, DPT Acute Rehabilitation Services Pager [phone] Office [phone]    Linna Hoff 03/16/2021, 4:58 PM

## 2021-03-16 NOTE — Progress Notes (Signed)
Peripherally Inserted Central Catheter Placement  The IV Nurse has discussed with the patient and/or persons authorized to consent for the patient, the purpose of this procedure and the potential benefits and risks involved with this procedure.  The benefits include less needle sticks, lab draws from the catheter, and the patient may be discharged home with the catheter. Risks include, but not limited to, infection, bleeding, blood clot (thrombus formation), and puncture of an artery; nerve damage and irregular heartbeat and possibility to perform a PICC exchange if needed/ordered by physician.  Alternatives to this procedure were also discussed.  Bard Power PICC patient education guide, fact sheet on infection prevention and patient information card has been provided to patient /or left at bedside.  Daughter at bedside for explanation of PICC.  PICC Placement Documentation  PICC Single Lumen 64/68/03 Right Basilic 38 cm 0 cm (Active)  Indication for Insertion or Continuance of Line Home intravenous therapies (PICC only) 03/16/21 1843  Exposed Catheter (cm) 0 cm 03/16/21 1843  Site Assessment Clean;Dry;Intact 03/16/21 1843  Line Status Flushed;Saline locked;Blood return noted 03/16/21 1843  Dressing Type Transparent 03/16/21 1843  Dressing Status Clean;Dry;Intact 03/16/21 1843  Antimicrobial disc in place? Yes 03/16/21 1843  Safety Lock Not Applicable 21/22/48 2500  Line Care Connections checked and tightened 03/16/21 1843  Line Adjustment (NICU/IV Team Only) No 03/16/21 1843  Dressing Intervention New dressing 03/16/21 1843  Dressing Change Due 03/23/21 03/16/21 1843       Naoki Migliaccio, Nicolette Bang 03/16/2021, 6:44 PM

## 2021-03-16 NOTE — TOC Progression Note (Signed)
Transition of Care [facility]) - Progression Note    Patient Details  Name: Andrea [name] MRN: [id number] Date of Birth: 1946/05/21  Transition of Care [facility] CM/SW [place], [place] Phone Number: 03/16/2021, 3:45 PM  Clinical Narrative:   CSW notified by MD that patient will need IV antibiotics at home. CSW spoke with Jeannene Patella with [employer] who will meet with family to provide education. CSW met with patient and family at bedside to provide update, and they were appreciative. Patient will need picc placed tomorrow and will receive doses of antibiotic at the hospital tomorrow, then discharge home afterward. Home infusion therapy can begin on Sunday, and Alvis Lemmings will initiate home health for the patient on Monday. Patient and family agreeable to plan. CSW updated MD on plan. CSW to follow.    Expected Discharge Plan: [place] Barriers to Discharge: Continued Medical Work up  Expected Discharge Plan and Services Expected Discharge Plan: [place] In-house Referral: NA Discharge Planning Services: CM Consult Post Acute Care Choice: Durable Medical Equipment, Home Health Living arrangements for the past 2 months: Single Family Home                 DME Arranged: 3-N-1, Walker rolling DME Agency: [employer] Date DME Agency Contacted: 03/09/21 Time DME Agency Contacted: 0931 Representative spoke with at DME Agency: Freda Munro HH Arranged: PT [place]: [employer] Date [place]: 03/02/21 Time [place]: 1238 Representative spoke with at [place]: [place] ([place]) Interventions    Readmission Risk Interventions No flowsheet data found.

## 2021-03-16 NOTE — Progress Notes (Signed)
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  Subsequently found to have Moraxella bacteremia as well.  This raised the possibility of brain abscess instead of neoplasm.  She went to the OR 7/12 with neurosurgery for biopsy and cultures.  Prelim path reported as concerning for neoplasm however formal read is pending.  OR cultures remain negative and repeat blood cultures negative as well.  She continues on ceftriaxone and metronidazole.  Discussed with neurosurgery and given the unclear  diagnosis at this time will treat empirically for suspected brain abscess until formal pathology is back.  PLAN:    Continue ceftriaxone and metronidazole pending pathology PICC line Will arrange follow-up See OPAT note below Okay to discharge once everything is set up  Diagnosis: Questionable brain abscess  Culture Result: Moraxella   Allergies  Allergen Reactions   Penicillins Rash    OPAT Orders Discharge antibiotics to be given via PICC line Discharge antibiotics: Per pharmacy protocol ceftriaxone 2gm IV BID and Flagyl PO 59m TID  Duration: 6 weeks End Date: 04/24/21  [facility]  Home health RN for IV administration and teaching; PICC line care and labs.    Labs weekly while on IV antibiotics: _x_ CBC with differential __ BMP _x_ CMP __ CRP __ ESR __ Vancomycin trough __ CK  _x_ Please pull PIC at completion of IV antibiotics __ Please leave PIC in place until doctor has seen patient or been notified  Fax weekly labs to [phone] Clinic Follow Up Appt: 04/10/21 @ 1130am with Dr WJuleen China   Principal Problem:   Seizure ([facility] Active Problems:   Gram-negative  bacteremia   Leukocytosis   Brain abscess   Brain lesion    MEDICATIONS:    Scheduled Meds:  chlorhexidine gluconate (MEDLINE KIT)  15 mL Mouth Rinse BID   Chlorhexidine Gluconate Cloth  6 each Topical Daily   docusate  100 mg Oral BID   heparin injection (subcutaneous)  5,000 Units Subcutaneous Q12H   hydrochlorothiazide  12.5 mg Oral Daily   insulin aspart  0-9 Units Subcutaneous TID WC   levETIRAcetam  1,000 mg Oral BID   lisinopril  5 mg Oral Daily   mouth rinse  15 mL Mouth Rinse BID   pantoprazole  40 mg Oral QHS   polyethylene glycol  17 g Per Tube Daily   sodium chloride flush  3 mL Intravenous Once   Continuous Infusions:  cefTRIAXone (ROCEPHIN)  IV     metronidazole     PRN Meds:.acetaminophen, docusate sodium, HYDROcodone-acetaminophen, labetalol, morphine injection, ondansetron **OR** ondansetron (ZOFRAN) IV, polyethylene glycol, promethazine  SUBJECTIVE:    Patient with no acute complaints.  No fevers, feels well.  Wants to go home.   Review of Systems  All other systems reviewed and are negative.    OBJECTIVE:   Blood pressure 138/62, pulse 87, temperature (!) 97.5 F (36.4 C), temperature source Oral, resp. rate 18, height '5\' 6"'  (1.676 m), weight 73.4 kg, SpO2 99 %. Body mass index is 26.12 kg/m.  Physical Exam Constitutional:      General: She is not in acute distress.    Appearance: Normal appearance.  HENT:  Head: Normocephalic and atraumatic.     Comments: Staples in place.  Eyes:     Extraocular Movements: Extraocular movements intact.     Conjunctiva/sclera: Conjunctivae normal.  Skin:    General: Skin is warm and dry.     Findings: No rash.  Neurological:     General: No focal deficit present.     Mental Status: She is alert and oriented to person, place, and time.  Psychiatric:        Mood and Affect: Mood normal.        Behavior: Behavior normal.     Lab Results: Lab Results  Component Value Date   WBC 11.5 (H)  03/15/2021   HGB 11.1 (L) 03/15/2021   HCT 33.7 (L) 03/15/2021   MCV 98.3 03/15/2021   PLT 312 03/15/2021    Lab Results  Component Value Date   NA 138 03/15/2021   K 3.1 (L) 03/15/2021   CO2 26 03/15/2021   GLUCOSE 113 (H) 03/15/2021   BUN 11 03/15/2021   CREATININE 0.70 03/15/2021   CALCIUM 8.8 (L) 03/15/2021   GFRNONAA >60 03/15/2021    Lab Results  Component Value Date   ALT 24 03/07/2021   AST 35 03/07/2021   ALKPHOS 57 03/07/2021   BILITOT 0.9 03/07/2021    No results found for: CRP  No results found for: ESRSEDRATE   I have reviewed the micro and lab results in Epic.  Imaging: [place] EKG SITE RITE  Result Date: 03/16/2021 If Site Rite image not attached, placement could not be confirmed due to current cardiac rhythm.    Imaging independently reviewed in Epic.    Raynelle [facility] [phone] pager 03/16/2021, 1:49 PM  I spent greater than 35 minutes with the patient including greater than 50% of time in face to face counsel of the patient and in coordination of their care.

## 2021-03-16 NOTE — Care Management Important Message (Signed)
Important Message  Patient Details  Name: Andrea [name] MRN: [id number] Date of Birth: 1945-12-05   Medicare Important Message Given:  Yes     Orbie Pyo 03/16/2021, 2:37 PM

## 2021-03-16 NOTE — Care Management Important Message (Signed)
Important Message  Patient Details  Name: Andrea [name] MRN: [id number] Date of Birth: Aug 15, 1946   Medicare Important Message Given:  Yes     Orbie Pyo 03/16/2021, 2:40 PM

## 2021-03-16 NOTE — Progress Notes (Signed)
PROGRESS NOTE    Andrea [name]  GNF:[id number] DOB: 06/28/1946 DOA: 03/06/2021 PCP: Celene Squibb, MD   Brief Narrative: Andrea [name] is a 75 y.o. female with a history of hypertension. [place] with concern for seizure activity. Patient was admitted to the ICU on admission. LP obtained and MRI significant for multifocal ring enhancing lesions. Keppra initiated with improvement of seizure activity. Blood culture significant for moraxella. Plan for brain biopsy.   Assessment & Plan:   Principal Problem:   Seizure ([place]) Active Problems:   Gram-negative bacteremia   Leukocytosis   Brain abscess   Brain lesion  Seizure, likely provoked in the setting of multiple ring-enhancing brain lesions POA Rule out infection Concern for neoplastic origin Neurology and neurosurgery following, appreciate insight and recommendations LP initially obtained not consistent with infection LP cytology without notable malignant findings Continue Keppra per neurology, tolerating well Neurosurgery - right parietal occipital craniotomy for open biopsy on 03/13/2021, tolerated procedure well Awaiting more definitive cultures from biopsy -we will continue antibiotics as below at discharge to cover until confirmed negative cultures.  Moraxella bacteremia 1/4 samples positive. Patient was empirically started on meropenem IV.  ID consulted and have transitioned her to Ceftriaxone IV/Flagyl.  Repeat blood cultures (7/8) with no growth to date. Continue ceftriaxone/metronidazole until formal cultures from brain biopsy -will discharge with PICC line per discussion with infectious disease  Hypertensive urgency, resolving Continue lisinopril HCTZ  Hypokalemia Previously repleted, repeat labs as needed  Memory impairment Seems to be related to above. Currently stable. Somewhat improving per husband at bedside   DVT prophylaxis: Heparin Code  Status:   Code Status: Full Code Family Communication: Husband at bedside Disposition Plan: Home pending biopsy results in the next 24 to 48 hours, awaiting PICC line placement so she can be safely discharged on IV antibiotics  Consultants:  Neurology Neurosurgery PCCM Infectious disease  Procedures:  EEG (7/6) IMPRESSION: This study is suggestive of moderate diffuse encephalopathy, nonspecific etiology but could be secondary to sedation. No seizures or epileptiform discharges were seen throughout the recording. EEG (7/6) IMPRESSION: This study is suggestive of cortical dysfunction in right hemisphere likely secondary to underlying structural abnormality as well as moderate diffuse encephalopathy, nonspecific etiology but could be secondary to sedation. After propofol was discontinued, encephalopathy improved. No seizures or epileptiform discharges were seen throughout the recording.  Antimicrobials: Meropenem IV -discontinued Ceftriaxone IV Flagyl    Subjective: No acute issues or events overnight, patient continues to request discharge home, we discussed ongoing need for safe disposition including PICC line and antibiotic arrangement before she can discharge otherwise denies nausea vomiting diarrhea constipation headache fevers chills chest pain shortness of breath  Objective: Vitals:   03/16/21 0541 03/16/21 0803 03/16/21 1138 03/16/21 1245  BP: (!) 155/83 (!) 176/89 (!) 168/91 138/62  Pulse: 80 78 87   Resp: '16 18 18   ' Temp: 97.9 F (36.6 C) 98.2 F (36.8 C) (!) 97.5 F (36.4 C)   TempSrc: Oral Oral Oral   SpO2: 97% 99% 99%   Weight:      Height:        Intake/Output Summary (Last 24 hours) at 03/16/2021 1424 Last data filed at 03/15/2021 1800 [place] per 24 hour  Intake 240 ml  Output --  Net 240 ml    Filed Weights   03/13/21 0944 03/15/21 0500 03/16/21 0500  Weight: 84.1 kg 73.9 kg 73.4 kg    Examination:  General:  Pleasantly resting in bed, No acute  distress. HEENT:  Sclerae nonicteric, noninjected.  Extraocular movements intact bilaterally. Neck:  Without mass or deformity.  Trachea is midline. Lungs:  Clear to auscultate bilaterally without rhonchi, wheeze, or rales. Heart:  Regular rate and rhythm.  Without murmurs, rubs, or gallops. Abdomen:  Soft, nontender, nondistended.  Without guarding or rebound. Extremities: Without cyanosis, clubbing, edema, or obvious deformity.  Data Reviewed: I have personally reviewed following labs and imaging studies  CBC Lab Results  Component Value Date   WBC 11.5 (H) 03/15/2021   RBC 3.43 (L) 03/15/2021   HGB 11.1 (L) 03/15/2021   HCT 33.7 (L) 03/15/2021   MCV 98.3 03/15/2021   MCH 32.4 03/15/2021   PLT 312 03/15/2021   MCHC 32.9 03/15/2021   RDW 14.2 03/15/2021   LYMPHSABS 2.4 03/07/2021   MONOABS 0.9 03/07/2021   EOSABS 0.0 03/07/2021   BASOSABS 0.0 85/63/1497     Last metabolic panel Lab Results  Component Value Date   NA 138 03/15/2021   K 3.1 (L) 03/15/2021   CL 104 03/15/2021   CO2 26 03/15/2021   BUN 11 03/15/2021   CREATININE 0.70 03/15/2021   GLUCOSE 113 (H) 03/15/2021   GFRNONAA >60 03/15/2021   CALCIUM 8.8 (L) 03/15/2021   PHOS 2.1 (L) 03/07/2021   PROT 6.4 (L) 03/07/2021   ALBUMIN 3.3 (L) 03/07/2021   BILITOT 0.9 03/07/2021   ALKPHOS 57 03/07/2021   AST 35 03/07/2021   ALT 24 03/07/2021   ANIONGAP 8 03/15/2021    CBG (last 3)  Recent Labs    03/15/21 2146 03/16/21 0638 03/16/21 1137  GLUCAP 109* 96 102*      GFR: Estimated Creatinine Clearance: 62.3 mL/min (by C-G formula based on SCr of 0.7 mg/dL).  Coagulation Profile: Recent Labs  Lab 03/12/21 1833  INR 1.0     Recent Results (from the past 240 hour(s))  SARS CORONAVIRUS 2 (TAT 6-24 HRS) Nasopharyngeal Nasopharyngeal Swab     Status: None   Collection Time: 03/06/21  6:54 PM   Specimen: Nasopharyngeal Swab  Result Value Ref Range Status   SARS Coronavirus 2 NEGATIVE NEGATIVE Final     Comment: (NOTE) SARS-CoV-2 target nucleic acids are NOT DETECTED.  The SARS-CoV-2 RNA is generally detectable in upper and lower respiratory specimens during the acute phase of infection. Negative results do not preclude SARS-CoV-2 infection, do not rule out co-infections with other pathogens, and should not be used as the sole basis for treatment or other patient management decisions. Negative results must be combined with clinical observations, patient history, and epidemiological information. The expected result is Negative.  Fact Sheet for Patients: SugarRoll.be  Fact Sheet for Healthcare Providers: https://www.woods-mathews.com/  This test is not yet approved or cleared by the [place] FDA and  has been authorized for detection and/or diagnosis of SARS-CoV-2 by FDA under an Emergency Use Authorization (EUA). This EUA will remain  in effect (meaning this test can be used) for the duration of the COVID-19 declaration under Se ction 564(b)(1) of the Act, 21 U.S.C. section 360bbb-3(b)(1), unless the authorization is terminated or revoked sooner.  Performed at [facility], [place] [address], [place], [place] [address]   Urine culture     Status: None   Collection Time: 03/06/21  7:32 PM   Specimen: Urine, Random  Result Value Ref Range Status   Specimen Description URINE, RANDOM  Final   Special Requests NONE  Final   Culture  Final    NO GROWTH Performed at [facility], [place] [address] [place], [place] [address]    Report Status 03/08/2021 FINAL  Final  MRSA Next Gen by PCR, Nasal     Status: None   Collection Time: 03/06/21  9:50 PM   Specimen: Nasal Mucosa; Nasal Swab  Result Value Ref Range Status   MRSA by PCR Next Gen NOT DETECTED NOT DETECTED Final    Comment: (NOTE) The GeneXpert MRSA Assay (FDA approved for NASAL specimens only), is one component of a comprehensive MRSA colonization  surveillance program. It is not intended to diagnose MRSA infection nor to guide or monitor treatment for MRSA infections. Test performance is not FDA approved in patients less than 42 years old. Performed at [facility], [place] [address] [place], [place] [address]   Culture, blood (routine x 2)     Status: None   Collection Time: 03/06/21 10:35 PM   Specimen: BLOOD LEFT WRIST  Result Value Ref Range Status   Specimen Description BLOOD LEFT WRIST  Final   Special Requests   Final    BOTTLES DRAWN AEROBIC ONLY Blood Culture results may not be optimal due to an inadequate volume of blood received in culture bottles   Culture   Final    NO GROWTH 5 DAYS Performed at [facility], [place] [address], [place], [place] [address]    Report Status 03/12/2021 FINAL  Final  Culture, blood (routine x 2)     Status: None   Collection Time: 03/06/21 10:40 PM   Specimen: BLOOD RIGHT WRIST  Result Value Ref Range Status   Specimen Description BLOOD RIGHT WRIST  Final   Special Requests   Final    BOTTLES DRAWN AEROBIC ONLY Blood Culture results may not be optimal due to an inadequate volume of blood received in culture bottles   Culture   Final    NO GROWTH 5 DAYS Performed at [facility], [place] [address] [place], [place] [address]    Report Status 03/12/2021 FINAL  Final  Culture, blood (routine x 2)     Status: Abnormal   Collection Time: 03/07/21 10:18 AM   Specimen: BLOOD LEFT ARM  Result Value Ref Range Status   Specimen Description BLOOD LEFT ARM  Final   Special Requests   Final    BOTTLES DRAWN AEROBIC ONLY Blood Culture results may not be optimal due to an inadequate volume of blood received in culture bottles   Culture  Setup Time   Final    GRAM NEGATIVE RODS AEROBIC BOTTLE ONLY Organism ID to follow CRITICAL RESULT CALLED TO, READ BACK BY AND VERIFIED WITH: G. BARR PHARMD, AT 0126 03/09/21 D. Victoriano Lain    Culture (A)  Final    MORAXELLA SPECIES BETA LACTAMASE  POSITIVE Performed at [facility], [place] [address], [place], [place] [address]    Report Status 03/09/2021 FINAL  Final  Blood Culture ID Panel (Reflexed)     Status: None   Collection Time: 03/07/21 10:18 AM  Result Value Ref Range Status   Enterococcus faecalis NOT DETECTED NOT DETECTED Final   Enterococcus Faecium NOT DETECTED NOT DETECTED Final   Listeria monocytogenes NOT DETECTED NOT DETECTED Final   Staphylococcus species NOT DETECTED NOT DETECTED Final   Staphylococcus aureus (BCID) NOT DETECTED NOT DETECTED Final   Staphylococcus epidermidis NOT DETECTED NOT DETECTED Final   Staphylococcus lugdunensis NOT DETECTED NOT DETECTED Final   Streptococcus  species NOT DETECTED NOT DETECTED Final   Streptococcus agalactiae NOT DETECTED NOT DETECTED Final   Streptococcus pneumoniae NOT DETECTED NOT DETECTED Final   Streptococcus pyogenes NOT DETECTED NOT DETECTED Final   A.calcoaceticus-baumannii NOT DETECTED NOT DETECTED Final   Bacteroides fragilis NOT DETECTED NOT DETECTED Final   Enterobacterales NOT DETECTED NOT DETECTED Final   Enterobacter cloacae complex NOT DETECTED NOT DETECTED Final   Escherichia coli NOT DETECTED NOT DETECTED Final   Klebsiella aerogenes NOT DETECTED NOT DETECTED Final   Klebsiella oxytoca NOT DETECTED NOT DETECTED Final   Klebsiella pneumoniae NOT DETECTED NOT DETECTED Final   Proteus species NOT DETECTED NOT DETECTED Final   Salmonella species NOT DETECTED NOT DETECTED Final   Serratia marcescens NOT DETECTED NOT DETECTED Final   Haemophilus influenzae NOT DETECTED NOT DETECTED Final   Neisseria meningitidis NOT DETECTED NOT DETECTED Final   Pseudomonas aeruginosa NOT DETECTED NOT DETECTED Final   Stenotrophomonas maltophilia NOT DETECTED NOT DETECTED Final   Candida albicans NOT DETECTED NOT DETECTED Final   Candida auris NOT DETECTED NOT DETECTED Final   Candida glabrata NOT DETECTED NOT DETECTED Final   Candida krusei NOT DETECTED NOT  DETECTED Final   Candida parapsilosis NOT DETECTED NOT DETECTED Final   Candida tropicalis NOT DETECTED NOT DETECTED Final   Cryptococcus neoformans/gattii NOT DETECTED NOT DETECTED Final    Comment: Performed at [facility], [place] [address] [place], [place] [address]  CSF culture w Gram Stain     Status: None   Collection Time: 03/07/21 10:25 AM   Specimen: CSF; Cerebrospinal Fluid  Result Value Ref Range Status   Specimen Description CSF  Final   Special Requests LUMBAR PUNCTURE  Final   Gram Stain   Final    WBC PRESENT, PREDOMINANTLY MONONUCLEAR NO ORGANISMS SEEN CYTOSPIN SMEAR    Culture   Final    NO GROWTH 3 DAYS Performed at [facility], [place] [address], [place], [place] [address]    Report Status 03/10/2021 FINAL  Final  Culture, fungus without smear     Status: None (Preliminary result)   Collection Time: 03/07/21 10:25 AM   Specimen: CSF; Cerebrospinal Fluid  Result Value Ref Range Status   Specimen Description CSF  Final   Special Requests LUMBAR PUNCTURE  Final   Culture   Final    NO FUNGUS ISOLATED AFTER 8 DAYS Performed at [facility], [place] [address], [place], [place] [address]    Report Status PENDING  Incomplete  Culture, blood (routine x 2)     Status: None   Collection Time: 03/07/21 11:09 AM   Specimen: BLOOD LEFT WRIST  Result Value Ref Range Status   Specimen Description BLOOD LEFT WRIST  Final   Special Requests   Final    BOTTLES DRAWN AEROBIC ONLY Blood Culture results may not be optimal due to an inadequate volume of blood received in culture bottles   Culture   Final    NO GROWTH 5 DAYS Performed at [facility], [place] [address] [place], [place] [address]    Report Status 03/12/2021 FINAL  Final  Culture, blood (routine x 2)     Status: None   Collection Time: 03/09/21  2:45 AM   Specimen: BLOOD LEFT FOREARM  Result Value Ref Range Status   Specimen Description BLOOD LEFT FOREARM  Final   Special Requests   Final     BOTTLES DRAWN AEROBIC AND ANAEROBIC Blood Culture adequate volume  Culture   Final    NO GROWTH 5 DAYS Performed at [facility], [place] [address] [place], [place] [address]    Report Status 03/14/2021 FINAL  Final  Culture, blood (routine x 2)     Status: None   Collection Time: 03/09/21  2:55 AM   Specimen: BLOOD LEFT FOREARM  Result Value Ref Range Status   Specimen Description BLOOD LEFT FOREARM  Final   Special Requests   Final    BOTTLES DRAWN AEROBIC AND ANAEROBIC Blood Culture adequate volume   Culture   Final    NO GROWTH 5 DAYS Performed at [facility], [place] [address], [place], [place] [address]    Report Status 03/14/2021 FINAL  Final  Aerobic/Anaerobic Culture w Gram Stain (surgical/deep wound)     Status: None (Preliminary result)   Collection Time: 03/13/21  3:35 PM   Specimen: PATH Soft tissue  Result Value Ref Range Status   Specimen Description TISSUE  Final   Special Requests RIGHT PARIETAL LESION SAMPLE A  Final   Gram Stain   Final    RARE WBC PRESENT, PREDOMINANTLY MONONUCLEAR NO ORGANISMS SEEN    Culture   Final    NO GROWTH 3 DAYS NO ANAEROBES ISOLATED; CULTURE IN PROGRESS FOR 5 DAYS Performed at [facility], [place] [address] [place], [place] [address]    Report Status PENDING  Incomplete  Aerobic/Anaerobic Culture w Gram Stain (surgical/deep wound)     Status: None (Preliminary result)   Collection Time: 03/13/21  3:36 PM   Specimen: PATH Soft tissue  Result Value Ref Range Status   Specimen Description WOUND LESION  Final   Special Requests SWAB OF RT PARIETAL LESION SAMPLE D  Final   Gram Stain   Final    RARE WBC PRESENT,BOTH PMN AND MONONUCLEAR NO ORGANISMS SEEN    Culture   Final    NO GROWTH 3 DAYS NO ANAEROBES ISOLATED; CULTURE IN PROGRESS FOR 5 DAYS Performed at [facility], [place] [address] [place], [place] [address]    Report Status PENDING  Incomplete  Acid Fast Smear (AFB)     Status: None   Collection  Time: 03/13/21  3:36 PM   Specimen: PATH Soft tissue  Result Value Ref Range Status   AFB Specimen Processing Comment  Final    Comment: Tissue Grinding and Digestion/Decontamination   Acid Fast Smear Negative  Final    Comment: (NOTE) Performed At: [facility] [place], [place] [id number] Rush Farmer MD MV:[id number]    Source (AFB) TISSUE  Final    Comment: RT PARIETAL LESION ,SAMPLE C Performed at [facility], [place] [address] [place], [place] [address]          Radiology Studies: [place] EKG SITE RITE  Result Date: 03/16/2021 If Site Rite image not attached, placement could not be confirmed due to current cardiac rhythm.   Scheduled Meds:  chlorhexidine gluconate (MEDLINE KIT)  15 mL Mouth Rinse BID   Chlorhexidine Gluconate Cloth  6 each Topical Daily   docusate  100 mg Oral BID   heparin injection (subcutaneous)  5,000 Units Subcutaneous Q12H   hydrochlorothiazide  12.5 mg Oral Daily   insulin aspart  0-9 Units Subcutaneous TID WC   levETIRAcetam  1,000 mg Oral BID   lisinopril  5 mg Oral Daily   mouth rinse  15 mL Mouth Rinse BID   pantoprazole  40 mg Oral QHS  polyethylene glycol  17 g Per Tube Daily   sodium chloride flush  3 mL Intravenous Once   Continuous Infusions:  cefTRIAXone (ROCEPHIN)  IV     metronidazole       LOS: 10 days   Holli Humbles DO [facility] 03/16/2021, 2:24 PM  If 7PM-7AM, please contact night-coverage [email or web address]

## 2021-03-17 LAB — CBC
HCT: 30.9 % — ABNORMAL LOW (ref 36.0–46.0)
Hemoglobin: 10.1 g/dL — ABNORMAL LOW (ref 12.0–15.0)
MCH: 32.2 pg (ref 26.0–34.0)
MCHC: 32.7 g/dL (ref 30.0–36.0)
MCV: 98.4 fL (ref 80.0–100.0)
Platelets: 272 10*3/uL (ref 150–400)
RBC: 3.14 MIL/uL — ABNORMAL LOW (ref 3.87–5.11)
RDW: 14.5 % (ref 11.5–15.5)
WBC: 7.4 10*3/uL (ref 4.0–10.5)
nRBC: 0 % (ref 0.0–0.2)

## 2021-03-17 LAB — GLUCOSE, CAPILLARY
Glucose-Capillary: 112 mg/dL — ABNORMAL HIGH (ref 70–99)
Glucose-Capillary: 131 mg/dL — ABNORMAL HIGH (ref 70–99)

## 2021-03-17 LAB — BASIC METABOLIC PANEL
Anion gap: 4 — ABNORMAL LOW (ref 5–15)
BUN: 9 mg/dL (ref 8–23)
CO2: 27 mmol/L (ref 22–32)
Calcium: 8.6 mg/dL — ABNORMAL LOW (ref 8.9–10.3)
Chloride: 106 mmol/L (ref 98–111)
Creatinine, Ser: 0.63 mg/dL (ref 0.44–1.00)
GFR, Estimated: >60 mL/min (ref >60)
Glucose, Bld: 112 mg/dL — ABNORMAL HIGH (ref 70–99)
Potassium: 2.9 mmol/L — ABNORMAL LOW (ref 3.5–5.1)
Sodium: 137 mmol/L (ref 135–145)

## 2021-03-17 NOTE — Discharge Summary (Signed)
Physician Discharge Summary  Andrea [name] NGE:[id number] DOB: 1945/12/27 DOA: 03/06/2021  PCP: Celene Squibb, MD  Admit date: 03/06/2021 Discharge date: 03/17/2021  Admitted From: Home Disposition: Home  Recommendations for Outpatient Follow-up:  Follow up with PCP in 1-2 weeks Please obtain BMP/CBC in one week Please follow up on the following pending results:  Home Health: Resume home health Equipment/Devices: Gilford Rile, wheelchair  Discharge Condition: Stable CODE STATUS: Full Diet recommendation: As tolerated  Brief/Interim Summary: Andrea [name] is a 75 y.o. female with a history of hypertension. [place] with concern for seizure activity. Patient was admitted to the ICU on admission. LP obtained and MRI significant for multifocal ring enhancing lesions. Keppra initiated with improvement of seizure activity. Blood culture significant for moraxella. Plan for brain biopsy.  Patient admitted as above with acute likely provoked onset seizure in the setting of multiple ring-enhancing brain lesions.  Patient had initial work-up with neurology including LP cultures and imaging unremarkable for infection initially, although patient did have Moraxella positive blood culture times 1 out of 4, repeat cultures were negative.  Given the lesions noted on the MRI patient's case was discussed with neurosurgery who performed a biopsy for further evaluation and treatment.  During procedure surgery indicates there was a substance similar to pus which would indicate infection however patient's cultures remain preliminary negative.  Biopsy cultures are currently pending.  There is discussion that preliminary results from pathology may be neoplastic however this is preliminary and there is no formal diagnosis yet.  Patient currently quite stable, no further seizures while on Keppra, we appreciate neurology's recommendations and insight into patient's  new onset seizure.  At this time patient is requesting discharge home and family is agreeable, at this time patient is awaiting biopsy results but is medically stable for discharge home.  Discussed with infectious disease and given unclear process given pending brain biopsy we discussed with patient and will discharge with PICC line and IV antibiotics until formal biopsy can result.  We discussed need for close follow-up with PCP, neurosurgery for staple removal in 2 weeks as well as with neurology and infectious disease for ongoing discussion and biopsy results.  Patient otherwise stable and agreeable for discharge home, IV antibiotics to be continued through 04/24/2021 unless discontinued Early pending biopsy results and further discussion with ID.   Discharge Diagnoses:  Principal Problem:   Seizure [facility]) Active Problems:   Gram-negative bacteremia   Leukocytosis   Brain abscess   Brain lesion    Discharge Instructions  Discharge Instructions     [facility] pharmacist to adjust dose for Vancomycin, Aminoglycosides and other anti-infective therapies as requested by physician.   Complete by: As directed    [facility] to provide Cath Flo 48m   Complete by: As directed    Administer for PICC line occlusion and as ordered by physician for other access device issues.   Anaphylaxis Kit: Provided to treat any anaphylactic reaction to the medication being provided to the patient if First Dose or when requested by physician   Complete by: As directed    Epinephrine 141mml vial / amp: Administer 0.78m86m0.78ml478mubcutaneously once for moderate to severe anaphylaxis, nurse to call physician and pharmacy when reaction occurs and call 911 if needed for immediate care   Diphenhydramine 50mg34mIV vial: Administer 25-50mg 31mM PRN for first dose reaction, rash, itching, mild reaction, nurse to call physician and pharmacy when reaction  occurs   Sodium Chloride 0.9% NS 575m IV:  Administer if needed for hypovolemic blood pressure drop or as ordered by physician after call to physician with anaphylactic reaction   Call MD for:  persistant dizziness or light-headedness   Complete by: As directed    Call MD for:  redness, tenderness, or signs of infection (pain, swelling, redness, odor or green/yellow discharge around incision site)   Complete by: As directed    Call MD for:  temperature >100.4   Complete by: As directed    Change dressing on IV access line weekly and PRN   Complete by: As directed    Diet - low sodium heart healthy   Complete by: As directed    Flush IV access with Sodium Chloride 0.9% and Heparin 10 units/ml or 100 units/ml   Complete by: As directed    Home infusion instructions - [facility]   Complete by: As directed    Instructions: Flush IV access with Sodium Chloride 0.9% and Heparin 10units/ml or 100units/ml   Change dressing on IV access line: Weekly and PRN   Instructions Cath Flo 254m Administer for PICC Line occlusion and as ordered by physician for other access device   [facility] pharmacist to adjust dose for: Vancomycin, Aminoglycosides and other anti-infective therapies as requested by physician   Increase activity slowly   Complete by: As directed    Method of administration may be changed at the discretion of home infusion pharmacist based upon assessment of the patient and/or caregiver's ability to self-administer the medication ordered   Complete by: As directed    No wound care   Complete by: As directed       Allergies as of 03/17/2021       Reactions   Penicillins Rash        Medication List     TAKE these medications    benazepril-hydrochlorthiazide 10-12.5 MG tablet Commonly known as: LOTENSIN HCT Take 1 tablet by mouth 2 (two) times daily.   cefTRIAXone  IVPB Commonly known as: ROCEPHIN Inject 2 g into the vein every 12 (twelve) hours. Indication:  possible brain abscess First  Dose: No Last Day of Therapy:  04/24/2021 Labs - Once weekly:  CBC/D and BMP, Labs - Every other week:  ESR and CRP Method of administration: IV Push Method of administration may be changed at the discretion of home infusion pharmacist based upon assessment of the patient and/or caregiver's ability to self-administer the medication ordered.   diclofenac 75 MG EC tablet Commonly known as: VOLTAREN Take 75 mg by mouth 2 (two) times daily as needed (inflammation).   levETIRAcetam 1000 MG tablet Commonly known as: KEPPRA Take 1 tablet (1,000 mg total) by mouth 2 (two) times daily.   metroNIDAZOLE 500 MG tablet Commonly known as: Flagyl Take 1 tablet (500 mg total) by mouth 3 (three) times daily.   pantoprazole 40 MG tablet Commonly known as: PROTONIX Take 1 tablet (40 mg total) by mouth at bedtime.   PEPTO-BISMOL PO Take 1 Dose by mouth as needed (heartburn).   PREVAGEN PO Take 1 Dose by mouth daily.   ST JOHNS WORT PO Take 1 Dose by mouth daily.   VITAMIN B-12 PO Take 1 tablet by mouth daily.               Durable Medical Equipment  (From admission, onward)           Start     Ordered  03/15/21 0852  For home use only DME Walker rolling  Once       Question Answer Comment  Walker: With [place]   Patient needs a walker to treat with the following condition Weakness      03/15/21 0851   03/09/21 1257  For home use only DME 3 n 1  Once        03/09/21 1256   03/09/21 1257  For home use only DME Walker rolling  Once       Question Answer Comment  Walker: With [place] Wheels   Patient needs a walker to treat with the following condition Generalized weakness      03/09/21 1256              Discharge Care Instructions  (From admission, onward)           Start     Ordered   03/16/21 0000  Change dressing on IV access line weekly and PRN  (Home infusion instructions - [facility] )        03/16/21 1805            Follow-up  Information     [facility] Follow up.   Specialty: [place] Why: A representative from [facility] will contact you to arrange start date and time for your therapy. Contact information: [place] [address] [phone]         Dawley, Troy C, DO Follow up in 2 week(s).   Contact information: [address] [place] [address] [phone]         [facility] Follow up.   Why: Home IV Antibiotics. Carolynn Sayers RN [phone]               Allergies  Allergen Reactions   Penicillins Rash    Consultations: Infectious disease, neurology, neurosurgery.   Procedures/Studies: MR BRAIN W WO CONTRAST  Result Date: 03/07/2021 CLINICAL DATA:  Seizure EXAM: MRI HEAD WITHOUT AND WITH CONTRAST TECHNIQUE: Multiplanar, multiecho pulse sequences of the brain and surrounding structures were obtained without and with intravenous contrast. CONTRAST:  18m GADAVIST GADOBUTROL 1 MMOL/ML IV SOLN COMPARISON:  None. FINDINGS: Brain: There are numerous contrast-enhancing lesions bilaterally within the temporal and occipital lobes. The lesions are worst within both medial temporal lobes. The most confluent area of contrast enhancement measures 3.8 x 1.4 cm. Many of the lesions are ring-enhancing. The ring-enhancing lesions do not demonstrate central diffusion restriction. There is petechial hemorrhage associated with the right occipital lesion. There is mild vasogenic edema at the lesion sites without herniation or other significant mass effect. Vascular: Major flow voids are preserved. Skull and upper cervical spine: Normal calvarium and skull base. Visualized upper cervical spine and soft tissues are normal. Sinuses/Orbits:No paranasal sinus fluid levels or advanced mucosal thickening. No mastoid or middle ear effusion. Normal orbits. IMPRESSION: 1. Numerous contrast-enhancing lesions bilaterally within the temporal and  occipital lobes. Primary considerations include severe cerebritis versus a neoplastic process, such as multifocal glioma or metastatic disease. However, the distribution of lesions is atypical for metastatic disease. CSF sampling may be helpful. Electronically Signed   By: KUlyses JarredM.D.   On: 03/07/2021 03:31   [place]dom[place] Complete  Result Date: 03/08/2021 CLINICAL DATA:  Nausea vomiting. EXAM: ABDOMEN ULTRASOUND COMPLETE COMPARISON:  No prior. FINDINGS: Gallbladder: Limited evaluation due to patient's body habitus. 6 mm mobile nonshadowing echo density consistent with nonshadowing  stone or tumefactive sludge. Gallbladder wall thickness normal. Negative Murphy sign. Common bile duct: Diameter: 3 mm Liver: Increased hepatic echogenicity consistent with fatty infiltration and or hepatocellular disease. Slightly nodular contour noted about the left hepatic lobe. Possibility of cirrhosis should be considered. No focal hepatic abnormality identified. Portal vein is patent on color Doppler imaging with normal direction of blood flow towards the liver. IVC: No abnormality visualized. Pancreas: Visualized portion unremarkable. Spleen: Size and appearance within normal limits. Right Kidney: Length: 10.8 cm. Echogenicity within normal limits. No mass or hydronephrosis visualized. Left Kidney: Length: 11.3 cm. Echogenicity within normal limits. 4.5 cm simple cyst. No hydronephrosis visualized. Abdominal aorta: No aneurysm visualized. Other findings: Right pleural effusion incidentally noted. IMPRESSION: 1. Limited evaluation due to patient's body habitus. 6 mm mobile nonshadowing echo density consistent with nonshadowing gallstone or tumefactive sludge noted within the gallbladder. No evidence of cholecystitis. No biliary distention. 2. Increased hepatic echogenicity consistent with fatty infiltration or hepatocellular disease. Slightly nodular hepatic contour suggesting the possibility of cirrhosis. No focal hepatic  abnormality identified. 3.  4.5 cm simple left renal cyst. 4.  Right pleural effusion incidentally noted. Electronically Signed   By: Marcello Moores  Register   On: 03/08/2021 13:36   DG Chest Port 1 View  Result Date: 03/07/2021 CLINICAL DATA:  Respiratory failure. EXAM: PORTABLE CHEST 1 VIEW COMPARISON:  Chest x-ray 03/06/2021. FINDINGS: Patient rotated to the left. Endotracheal tube NG tube in stable position. Stable cardiomegaly. Mild left base atelectasis/infiltrate. Small left pleural effusion. No pneumothorax. IMPRESSION: 1. Endotracheal tube and NG tube in stable position. 2. Stable cardiomegaly. 3. Mild left base atelectasis/infiltrate. Small left pleural effusion. Electronically Signed   By: Marcello Moores  Register   On: 03/07/2021 07:11   DG Chest Portable 1 View  Result Date: 03/06/2021 CLINICAL DATA:  Post intubation, code stroke EXAM: PORTABLE CHEST 1 VIEW COMPARISON:  Portable exam 1835 hours without priors for comparison FINDINGS: Tip of endotracheal tube projects 3.7 cm above carina. Nasogastric tube extends into stomach. Normal heart size, mediastinal contours, and pulmonary vascularity. Atherosclerotic calcification aorta. Minimal bibasilar atelectasis. Lungs otherwise clear. No pulmonary infiltrate, pleural effusion, or pneumothorax. Bones demineralized. IMPRESSION: Minimal bibasilar atelectasis. Aortic Atherosclerosis (ICD10-I70.0). Electronically Signed   By: Lavonia Dana M.D.   On: 03/06/2021 18:47   EEG adult  Result Date: 03/07/2021 Lora Havens, MD     03/07/2021  8:52 AM Patient Name: Gabrelle Roca MRN: [id number] Epilepsy Attending: Lora Havens Referring Physician/Provider: Dr Roland Rack Date: 03/07/2021 Duration: 21.59 mins Patient history: 75 year old female with new onset seizures.  EEG to evaluate for seizures. Level of alertness:  lethargic AEDs during EEG study: Keppra, Propofol Technical aspects: This EEG study was done with scalp electrodes positioned according to the 10-20  International system of electrode placement. Electrical activity was acquired at a sampling rate of '500Hz'  and reviewed with a high frequency filter of '70Hz'  and a low frequency filter of '1Hz' . EEG data were recorded continuously and digitally stored. Description: EEG showed continuous generalized 3 to 5 Hz theta -delta slowing admixed with overriding 15 to 18 Hz beta activity. Hyperventilation and photic stimulation were not performed.   ABNORMALITY - Continuous slow, generalized - Excessive beta, generalized IMPRESSION: This study is suggestive of moderate diffuse encephalopathy, nonspecific etiology but could be secondary to sedation. No seizures or epileptiform discharges were seen throughout the recording. Priyanka Barbra Sarks   Overnight EEG with video  Result Date: 03/07/2021 Lora Havens, MD     03/07/2021  6:48 PM Patient Name: Oumou Smead MRN: [id number] Epilepsy Attending: Lora Havens Referring Physician/Provider: Dr Roland Rack Duration: 03/06/2021 2146 to 03/07/2021 1412  Patient history: 75 year old female with new onset seizures.  EEG to evaluate for seizures.  Level of alertness:  lethargic  AEDs during EEG study: Keppra, Propofol  Technical aspects: This EEG study was done with scalp electrodes positioned according to the 10-20 International system of electrode placement. Electrical activity was acquired at a sampling rate of '500Hz'  and reviewed with a high frequency filter of '70Hz'  and a low frequency filter of '1Hz' . EEG data were recorded continuously and digitally stored.  Description: EEG initially showed continuous generalized and lateralized right hemisphere 3 to 5 Hz theta -delta slowing admixed with overriding 15 to 18 Hz beta activity. After propofol was discontinued, EEG gradually improved and showed posterior dominant rhythm of 8-9 Hz activity of moderate voltage (25-35 uV) seen predominantly in posterior head regions, symmetric and reactive to eye opening and eye closing. Sleep was  characterized by vertex waves, sleep spindles (12 to 14 Hz), maximal frontocentral region. EEG continued to show right hemispheric 3-'5hz'  theta-delta slowing. Hyperventilation and photic stimulation were not performed.    ABNORMALITY - Continuous slow, generalized and lateralized right hemisphere - Excessive beta, generalized  IMPRESSION: This study is suggestive of cortical dysfunction in right hemisphere likely secondary to underlying structural abnormality as well as moderate diffuse encephalopathy, nonspecific etiology but could be secondary to sedation. After propofol was discontinued, encephalopathy improved. No seizures or epileptiform discharges were seen throughout the recording. Lora Havens    CT HEAD CODE STROKE WO CONTRAST  Result Date: 03/06/2021 CLINICAL DATA:  Code stroke. Neuro deficit, acute, stroke suspected. Additional history provided: Stroke-like symptoms. EXAM: CT HEAD WITHOUT CONTRAST TECHNIQUE: Contiguous axial images were obtained from the base of the skull through the vertex without intravenous contrast. COMPARISON:  No pertinent prior exams available for comparison. FINDINGS: Brain: Mild generalized cerebral atrophy. Apparent abnormal hypodensity within the anteromedial right temporal lobe/right hippocampus (for instance as seen on series 5, image 33). This may reflect an acute right PCA territory infarct. However, alternative considerations such as seizure related edema, encephalitis or an underlying mass cannot be excluded. An additional small focus of cortical hypodensity is questioned within the lateral right occipital lobe (series 2, image 15). This may reflect a small right MCA/PCA watershed territory infarct. No evidence of acute intracranial hemorrhage. No extra-axial fluid collection. No evidence of an intracranial mass. No midline shift. Vascular: No hyperdense vessel. Skull: Normal. Negative for fracture or focal lesion. Sinuses/Orbits: Visualized orbits show no acute  finding. Trace bilateral ethmoid sinus mucosal thickening. ASPECTS ([place] Stroke Program Early CT Score) - Ganglionic level infarction (caudate, lentiform nuclei, internal capsule, insula, M1-M3 cortex): Five - Supraganglionic infarction (M4-M6 cortex): 3 Total score (0-10 with 10 being normal): 9 These results were called by telephone at the time of interpretation on 03/06/2021 at 6:34 pm to provider Kathrynn Speed, who verbally acknowledged these results. IMPRESSION: Apparent abnormal hypodensity within the anteromedial right temporal lobe/right hippocampus. This may reflect an acute right PCA territory infarct. However, alternative considerations such as seizure related edema, encephalitis or an underlying mass cannot be excluded. A contrast-enhanced brain MRI is recommended for further evaluation. An additional small focus of cortical hypodensity is questioned within the lateral right occipital lobe, which may reflect an acute right MCA/PCA territory watershed infarct. ASPECTS is 8. Mild generalized cerebral atrophy. Electronically Signed   By: Kellie Simmering DO  On: 03/06/2021 18:36   [place] EKG SITE RITE  Result Date: 03/16/2021 If Site Rite image not attached, placement could not be confirmed due to current cardiac rhythm.  CT STEALTH HEAD W/O CONTRAST  Result Date: 03/09/2021 CLINICAL DATA:  Stereotactic CT for surgical planning. EXAM: CT HEAD WITHOUT CONTRAST TECHNIQUE: Contiguous axial images were obtained from the base of the skull through the vertex without intravenous contrast. COMPARISON:  CT head March 06, 2021.  MRI head March 07, 2021. FINDINGS: Limited surgical planning CT. Known multifocal enhancing lesions were better characterized on recent MRI. No unexpected findings on this study. No evidence of new/interval large vascular territory infarct, acute hemorrhage, midline shift, or hydrocephalus. No acute calvarial fracture. Mild ethmoid air cell mucosal thickening with otherwise clear sinuses.  No acute orbital findings. No mastoid effusions. IMPRESSION: Limited treatment planning CT. No unexpected findings. Please see recent MRI for characterization of multiple known enhancing lesions. Electronically Signed   By: Margaretha Sheffield MD   On: 03/09/2021 19:45   CT ANGIO HEAD NECK W WO CM W PERF (CODE STROKE)  Result Date: 03/06/2021 CLINICAL DATA:  Stroke, follow-up.  Assess intracranial arteries. EXAM: CT ANGIOGRAPHY HEAD AND NECK CT PERFUSION BRAIN TECHNIQUE: Multidetector CT imaging of the head and neck was performed using the standard protocol during bolus administration of intravenous contrast. Multiplanar CT image reconstructions and MIPs were obtained to evaluate the vascular anatomy. Carotid stenosis measurements (when applicable) are obtained utilizing NASCET criteria, using the distal internal carotid diameter as the denominator. Multiphase CT imaging of the brain was performed following IV bolus contrast injection. Subsequent parametric perfusion maps were calculated using RAPID software. CONTRAST:  151m OMNIPAQUE IOHEXOL 350 MG/ML SOLN COMPARISON:  Noncontrast head CT performed immediately prior. FINDINGS: CTA NECK FINDINGS Aortic arch: Common origin of the innominate and left common carotid arteries. The visualized aortic arch is normal in caliber. Atherosclerotic plaque within the visualized aortic arch and proximal major branch vessels of the neck. No hemodynamically significant innominate or proximal subclavian artery stenosis. Right carotid system: CCA and ICA patent within the neck without hemodynamically significant stenosis (50% or greater). Mild soft and calcified plaque within the mid to distal CCA, carotid bifurcation and proximal ICA. Left carotid system: CCA and ICA patent within neck without hemodynamically significant stenosis (50% or greater). Mild soft and calcified plaque within the mid to distal CCA, carotid bifurcation and proximal ICA. Tortuosity of the distal cervical  ICA. Vertebral arteries: Vertebral arteries codominant and patent within the neck without hemodynamically significant stenosis. Skeleton: Cervical spondylosis. No acute bony abnormality or aggressive osseous lesion. Other neck: Subcentimeter thyroid nodules not meeting consensus criteria for ultrasound follow-up. No neck mass or cervical lymphadenopathy. Upper chest: No consolidation within the imaged lung apices. Centrilobular emphysema. Partially visualized support tubes. Review of the MIP images confirms the above findings CTA HEAD FINDINGS Anterior circulation: The intracranial internal carotid arteries are patent. Calcified plaque within both vessels with no more than mild stenosis. The M1 middle cerebral arteries are patent. No M2 proximal branch occlusion or high-grade proximal stenosis is identified. The anterior cerebral arteries are patent. 1-2 mm inferiorly projecting vascular true shin arising from the supraclinoid left ICA, which may reflect an infundibulum or aneurysm (series 9, image 123). Posterior circulation: The intracranial vertebral arteries are patent. The basilar artery is patent. The posterior cerebral arteries are patent. Fetal origin right posterior cerebral artery. The left posterior communicating artery is hypoplastic or absent. Venous sinuses: Within the limitations of contrast timing, no  convincing thrombus. Anatomic variants: As described Review of the MIP images confirms the above findings CT Brain Perfusion Findings: CBF (<30%) Volume: 13m Perfusion (Tmax>6.0s) volume: 066mMismatch Volume: 75m78mnfarction Location:None identified These results were communicated to Dr. KirLeonel Ramsay 6:47 pmon 7/5/2022by text page via the AMISt. [facility] Psych[facility] Rehabilitation Centerssaging system. IMPRESSION: CTA neck: The common carotid, internal carotid and vertebral arteries are patent within the neck without hemodynamically significant stenosis. Atherosclerotic disease within the visualized aortic arch, proximal major branch vessels  of the neck and bilateral carotid arteries as described. CTA head: No intracranial large vessel occlusion or proximal high-grade stenosis. CT perfusion head: The perfusion software identifies no core infarct. The perfusion software identifies no critically hypoperfused parenchyma utilizing the Tmax>6 seconds threshold. No mismatch reported. Electronically Signed   By: KylKellie Simmering   On: 03/06/2021 18:47     Subjective: No acute issues or events overnight denies nausea vomiting diarrhea constipation headache fevers chills or chest pain.   Discharge Exam: Vitals:   03/17/21 0759 03/17/21 1127  BP: (!) 143/84 134/60  Pulse: 72 74  Resp: 18   Temp: 98.2 F (36.8 C)   SpO2: 97% 97%   Vitals:   03/17/21 0500 03/17/21 0631 03/17/21 0759 03/17/21 1127  BP:   (!) 143/84 134/60  Pulse:   72 74  Resp:   18   Temp:   98.2 F (36.8 C)   TempSrc:      SpO2:   97% 97%  Weight: 71.7 kg 70.4 kg    Height:        General: Pt is alert, awake, not in acute distress Cardiovascular: RRR, S1/S2 +, no rubs, no gallops Respiratory: CTA bilaterally, no wheezing, no rhonchi Abdominal: Soft, NT, ND, bowel sounds + Extremities: no edema, no cyanosis    The results of significant diagnostics from this hospitalization (including imaging, microbiology, ancillary and laboratory) are listed below for reference.     Microbiology: Recent Results (from the past 240 hour(s))  Culture, blood (routine x 2)     Status: None   Collection Time: 03/09/21  2:45 AM   Specimen: BLOOD LEFT FOREARM  Result Value Ref Range Status   Specimen Description BLOOD LEFT FOREARM  Final   Special Requests   Final    BOTTLES DRAWN AEROBIC AND ANAEROBIC Blood Culture adequate volume   Culture   Final    NO GROWTH 5 DAYS Performed at [facility] [id number] Report Status 03/14/2021 FINAL  Final  Culture, blood (routine x 2)     Status: None   Collection Time: 03/09/21  2:55 AM    Specimen: BLOOD LEFT FOREARM  Result Value Ref Range Status   Specimen Description BLOOD LEFT FOREARM  Final   Special Requests   Final    BOTTLES DRAWN AEROBIC AND ANAEROBIC Blood Culture adequate volume   Culture   Final    NO GROWTH 5 DAYS Performed at [facility] [id number] Report Status 03/14/2021 FINAL  Final  Aerobic/Anaerobic Culture w Gram Stain (surgical/deep wound)     Status: None (Preliminary result)   Collection Time: 03/13/21  3:35 PM   Specimen: PATH Soft tissue  Result Value Ref Range Status   Specimen Description TISSUE  Final   Special Requests RIGHT PARIETAL LESION SAMPLE A  Final   Gram Stain   Final    RARE WBC PRESENT, PREDOMINANTLY MONONUCLEAR  NO ORGANISMS SEEN    Culture   Final    NO GROWTH 4 DAYS NO ANAEROBES ISOLATED; CULTURE IN PROGRESS FOR 5 DAYS Performed at [facility], [place] [address] [place], [place] [address]    Report Status PENDING  Incomplete  Fungus Culture With Stain     Status: None (Preliminary result)   Collection Time: 03/13/21  3:35 PM  Result Value Ref Range Status   Fungus Stain Final report  Final    Comment: (NOTE) Performed At: [facility] [place], [place] [id number] Rush Farmer MD ER:[id number]    Fungus (Mycology) Culture PENDING  Incomplete   Fungal Source TISSUE  Final    Comment: RT PARIETAL LESION SAMPLE B Performed at [facility], [place] [address], [place], [place] [address]   Fungus Culture Result     Status: None   Collection Time: 03/13/21  3:35 PM  Result Value Ref Range Status   Result 1 Comment  Final    Comment: (NOTE) KOH/Calcofluor preparation:  no fungus observed. Performed At: [facility] [place], [place] [id number] Rush Farmer MD DX:[id number]   Fungus Culture With Stain     Status: None (Preliminary result)   Collection Time: 03/13/21  3:36 PM   Specimen: PATH Soft tissue  Result Value  Ref Range Status   Fungus Stain Final report  Final    Comment: (NOTE) Performed At: [facility] [place], [place] [id number] Rush Farmer MD TK:[id number]    Fungus (Mycology) Culture PENDING  Incomplete   Fungal Source TISSUE  Final    Comment: RT PARIETAL LESION ,SAMPLE C Performed at [facility], [place] [address] [place] [address], [place], [place] [address]   Aerobic/Anaerobic Culture w Gram Stain (surgical/deep wound)     Status: None (Preliminary result)   Collection Time: 03/13/21  3:36 PM   Specimen: PATH Soft tissue  Result Value Ref Range Status   Specimen Description WOUND LESION  Final   Special Requests SWAB OF RT PARIETAL LESION SAMPLE D  Final   Gram Stain   Final    RARE WBC PRESENT,BOTH PMN AND MONONUCLEAR NO ORGANISMS SEEN    Culture   Final    NO GROWTH 3 DAYS NO ANAEROBES ISOLATED; CULTURE IN PROGRESS FOR 5 DAYS Performed at [facility], [place] [address], [place], [place] [address]    Report Status PENDING  Incomplete  Acid Fast Smear (AFB)     Status: None   Collection Time: 03/13/21  3:36 PM   Specimen: PATH Soft tissue  Result Value Ref Range Status   AFB Specimen Processing Comment  Final    Comment: Tissue Grinding and Digestion/Decontamination   Acid Fast Smear Negative  Final    Comment: (NOTE) Performed At: [facility] [place], [place] [id number] Rush Farmer MD QJ:[id number]    Source (AFB) TISSUE  Final    Comment: RT PARIETAL LESION ,SAMPLE C Performed at [facility], [place] [address], [place], [place] [address]   Fungus Culture Result     Status: None   Collection Time: 03/13/21  3:36 PM  Result Value Ref Range Status   Result 1 Comment  Final    Comment: (NOTE) KOH/Calcofluor preparation:  no fungus observed. Performed At: [facility] [place], [place] [id number] Rush Farmer MD VZ:[id number]      Labs: BNP (last 3 results) No results for input(s): BNP in  the last 8760 hours. Basic Metabolic Panel: Recent Labs  Lab 03/11/21 0048 03/12/21 0856 03/13/21 0337 03/15/21 0240 03/15/21 0548 03/17/21 0550  NA 140 134*  --  138  --  137  K 2.7* 3.9  --  3.1*  --  2.9*  CL 103 103  --  104  --  106  CO2 30 25  --  26  --  27  GLUCOSE 93 108*  --  113*  --  112*  BUN 10 9  --  11  --  9  CREATININE 0.70 0.72 0.74 0.70  --  0.63  CALCIUM 9.0 8.8*  --  8.8*  --  8.6*  MG 1.8  --   --   --  1.7  --    Liver Function Tests: No results for input(s): AST, ALT, ALKPHOS, BILITOT, PROT, ALBUMIN in the last 168 hours. No results for input(s): LIPASE, AMYLASE in the last 168 hours. No results for input(s): AMMONIA in the last 168 hours. CBC: Recent Labs  Lab 03/11/21 0048 03/13/21 0337 03/15/21 0240 03/17/21 0650  WBC 8.8 8.6 11.5* 7.4  HGB 11.6* 12.3 11.1* 10.1*  HCT 35.3* 38.1 33.7* 30.9*  MCV 96.7 99.0 98.3 98.4  PLT 312 339 312 272   Cardiac Enzymes: No results for input(s): CKTOTAL, CKMB, CKMBINDEX, TROPONINI in the last 168 hours. BNP: Invalid input(s): POCBNP CBG: Recent Labs  Lab 03/16/21 1137 03/16/21 1656 03/16/21 2110 03/17/21 0608 03/17/21 1129  GLUCAP 102* 175* 156* 112* 131*   D-Dimer No results for input(s): DDIMER in the last 72 hours. Hgb A1c No results for input(s): HGBA1C in the last 72 hours. Lipid Profile No results for input(s): CHOL, HDL, LDLCALC, TRIG, CHOLHDL, LDLDIRECT in the last 72 hours. Thyroid function studies No results for input(s): TSH, T4TOTAL, T3FREE, THYROIDAB in the last 72 hours.  Invalid input(s): FREET3 Anemia work up No results for input(s): VITAMINB12, FOLATE, FERRITIN, TIBC, IRON, RETICCTPCT in the last 72 hours. Urinalysis No results found for: COLORURINE, APPEARANCEUR, [place], [place], [place], [place], [place], [place], PROTEINUR, UROBILINOGEN, NITRITE, LEUKOCYTESUR Sepsis Labs Invalid input(s): PROCALCITONIN,  WBC,  LACTICIDVEN Microbiology Recent Results (from the  past 240 hour(s))  Culture, blood (routine x 2)     Status: None   Collection Time: 03/09/21  2:45 AM   Specimen: BLOOD LEFT FOREARM  Result Value Ref Range Status   Specimen Description BLOOD LEFT FOREARM  Final   Special Requests   Final    BOTTLES DRAWN AEROBIC AND ANAEROBIC Blood Culture adequate volume   Culture   Final    NO GROWTH 5 DAYS Performed at [facility], [address], [place], [place] [address]    Report Status 03/14/2021 FINAL  Final  Culture, blood (routine x 2)     Status: None   Collection Time: 03/09/21  2:55 AM   Specimen: BLOOD LEFT FOREARM  Result Value Ref Range Status   Specimen Description BLOOD LEFT FOREARM  Final   Special Requests   Final    BOTTLES DRAWN AEROBIC AND ANAEROBIC Blood Culture adequate volume   Culture   Final    NO GROWTH 5 DAYS Performed at [facility], [place] [address], [place], [place] [address]    Report Status 03/14/2021 FINAL  Final  Aerobic/Anaerobic Culture w Gram Stain (surgical/deep wound)     Status: None (Preliminary result)   Collection Time: 03/13/21  3:35 PM   Specimen: PATH Soft tissue  Result Value Ref Range  Status   Specimen Description TISSUE  Final   Special Requests RIGHT PARIETAL LESION SAMPLE A  Final   Gram Stain   Final    RARE WBC PRESENT, PREDOMINANTLY MONONUCLEAR NO ORGANISMS SEEN    Culture   Final    NO GROWTH 4 DAYS NO ANAEROBES ISOLATED; CULTURE IN PROGRESS FOR 5 DAYS Performed at [facility], [place] [address] [place], [place] [address]    Report Status PENDING  Incomplete  Fungus Culture With Stain     Status: None (Preliminary result)   Collection Time: 03/13/21  3:35 PM  Result Value Ref Range Status   Fungus Stain Final report  Final    Comment: (NOTE) Performed At: [facility] [place], [place] [id number] Rush Farmer MD QP:[id number]    Fungus (Mycology) Culture PENDING  Incomplete   Fungal Source TISSUE  Final    Comment: RT PARIETAL  LESION SAMPLE B Performed at [facility], [place] [address], [place], [place] [address]   Fungus Culture Result     Status: None   Collection Time: 03/13/21  3:35 PM  Result Value Ref Range Status   Result 1 Comment  Final    Comment: (NOTE) KOH/Calcofluor preparation:  no fungus observed. Performed At: [facility] [place], [place] [id number] Rush Farmer MD ES:[id number]   Fungus Culture With Stain     Status: None (Preliminary result)   Collection Time: 03/13/21  3:36 PM   Specimen: PATH Soft tissue  Result Value Ref Range Status   Fungus Stain Final report  Final    Comment: (NOTE) Performed At: [facility] [place], [place] [id number] Rush Farmer MD [id number]    Fungus (Mycology) Culture PENDING  Incomplete   Fungal Source TISSUE  Final    Comment: RT PARIETAL LESION ,SAMPLE C Performed at [facility], [place] [address] [place], [place] [address]   Aerobic/Anaerobic Culture w Gram Stain (surgical/deep wound)     Status: None (Preliminary result)   Collection Time: 03/13/21  3:36 PM   Specimen: PATH Soft tissue  Result Value Ref Range Status   Specimen Description WOUND LESION  Final   Special Requests SWAB OF RT PARIETAL LESION SAMPLE D  Final   Gram Stain   Final    RARE WBC PRESENT,BOTH PMN AND MONONUCLEAR NO ORGANISMS SEEN    Culture   Final    NO GROWTH 3 DAYS NO ANAEROBES ISOLATED; CULTURE IN PROGRESS FOR 5 DAYS Performed at [facility], [place] [address], [place], [place] [address]    Report Status PENDING  Incomplete  Acid Fast Smear (AFB)     Status: None   Collection Time: 03/13/21  3:36 PM   Specimen: PATH Soft tissue  Result Value Ref Range Status   AFB Specimen Processing Comment  Final    Comment: Tissue Grinding and Digestion/Decontamination   Acid Fast Smear Negative  Final    Comment: (NOTE) Performed At: [facility] [place], [place] [id number] Rush Farmer MD [id number]    Source (AFB) TISSUE  Final    Comment: RT PARIETAL LESION ,SAMPLE C Performed at [facility], [place] [address], [place],  [address]   Fungus Culture Result     Status: None   Collection Time: 03/13/21  3:36 PM  Result Value Ref Range Status   Result 1 Comment  Final    Comment: (NOTE) KOH/Calcofluor preparation:  no fungus observed. Performed At: [facility] [place], [place] [id number] Rush Farmer MD WI:[id number]      Time coordinating discharge: Over 30 minutes  SIGNED:   Little Ishikawa, DO [facility] 03/17/2021, 4:27 PM Pager   If 7PM-7AM, please contact night-coverage [email or web address]

## 2021-03-17 NOTE — Progress Notes (Signed)
   Providing Compassionate, Quality Care - Together  NEUROSURGERY PROGRESS NOTE   S: No issues overnight.   O: EXAM:  BP (!) 143/84 (BP Location: Right Arm)   Pulse 72   Temp 98.2 F (36.8 C)   Resp 18   Ht 5\' 6"  (1.676 m)   Wt 70.4 kg   LMP  (LMP Unknown)   SpO2 97%   BMI 25.05 kg/m   Awake, alert, oriented x3 PERRL L visual field hemianopsia Speech fluent, appropriate CNs grossly intact 5/5 BUE/BLE  incision c/d/i   ASSESSMENT:  75 y.o. female with Multiple brain lesions, concern for infection vs malignancy -s/p right parietal occipital craniotomy for open biopsy on 03/13/2021   -Prelim pathology was read as neoplasm, however multiple cultures were sent and there was note of some pus intraoperatively   PLAN: -cultures remain neg, await final path and culture results - ok from my standpoint for dc, will f/u in 2 weeks for staple removal - if path shows neoplasm, will discuss with Dr. Mickeal Skinner and tumor board team for further treatment -Discussed with Dr. Juleen China, recommend continue antibiotic coverage for possible infection given no definitive results of neoplasm versus infection yet.      Thank you for allowing me to participate in this patient's care.  Please do not hesitate to call with questions or concerns.   Elwin Sleight, [facility] Cell: [phone]

## 2021-03-17 NOTE — TOC Transition Note (Signed)
Transition of Care [facility]) - CM/SW Discharge Note   Patient Details  Name: Zeya Balles MRN: [id number] Date of Birth: 09-30-1945  Transition of Care [facility]) CM/SW Contact:  Carles Collet, RN Phone Number: 03/17/2021, 9:51 AM   Clinical Narrative:    Per Jeannene Patella w [facility], medications for home administration will be delivered to the house today. [place] RN through [facility] will start on Monday, but patinet and family has been trained and is able to self administer and go home prior to Monday. Patient will need Rocephin dose prior to DC. Notified nurse and MD, and requested consideration for dose to be administered prior to scheduled time of 6pm.     Final next level of care: Home w Home Health Services Barriers to Discharge: No Barriers Identified   Patient Goals and CMS Choice Patient states their goals for this hospitalization and ongoing recovery are:: to get home CMS Medicare.gov Compare Post Acute Care list provided to:: Patient Choice offered to / list presented to : Patient, Adult Children, Spouse  Discharge Placement                       Discharge Plan and Services In-house Referral: NA Discharge Planning Services: CM Consult Post Acute Care Choice: Durable Medical Equipment, Home Health          DME Arranged: 3-N-1, [place] rolling DME Agency: [employer] Date DME Agency Contacted: 03/09/21 Time DME Agency Contacted: 2094 Representative spoke with at DME Agency: Freda Munro HH Arranged: PT [place]: [place] Date [place]: 03/02/21 Time [place]: 1238 Representative spoke with at [place]: [place] ([place]) Interventions     Readmission Risk Interventions No flowsheet data found.

## 2021-03-19 ENCOUNTER — Encounter (HOSPITAL_COMMUNITY): Payer: Self-pay | Admitting: Neurological Surgery

## 2021-03-19 DIAGNOSIS — G939 Disorder of brain, unspecified: Secondary | ICD-10-CM | POA: Diagnosis not present

## 2021-03-19 DIAGNOSIS — I119 Hypertensive heart disease without heart failure: Secondary | ICD-10-CM | POA: Diagnosis not present

## 2021-03-19 DIAGNOSIS — R7881 Bacteremia: Secondary | ICD-10-CM | POA: Diagnosis not present

## 2021-03-19 DIAGNOSIS — B9689 Other specified bacterial agents as the cause of diseases classified elsewhere: Secondary | ICD-10-CM | POA: Diagnosis not present

## 2021-03-19 DIAGNOSIS — G060 Intracranial abscess and granuloma: Secondary | ICD-10-CM | POA: Diagnosis not present

## 2021-03-19 DIAGNOSIS — I10 Essential (primary) hypertension: Secondary | ICD-10-CM | POA: Diagnosis not present

## 2021-03-19 DIAGNOSIS — G40909 Epilepsy, unspecified, not intractable, without status epilepticus: Secondary | ICD-10-CM | POA: Diagnosis not present

## 2021-03-19 DIAGNOSIS — Z48811 Encounter for surgical aftercare following surgery on the nervous system: Secondary | ICD-10-CM | POA: Diagnosis not present

## 2021-03-19 DIAGNOSIS — Z792 Long term (current) use of antibiotics: Secondary | ICD-10-CM | POA: Diagnosis not present

## 2021-03-19 DIAGNOSIS — D72829 Elevated white blood cell count, unspecified: Secondary | ICD-10-CM | POA: Diagnosis not present

## 2021-03-19 DIAGNOSIS — Z87891 Personal history of nicotine dependence: Secondary | ICD-10-CM | POA: Diagnosis not present

## 2021-03-19 DIAGNOSIS — Z9181 History of falling: Secondary | ICD-10-CM | POA: Diagnosis not present

## 2021-03-19 DIAGNOSIS — I700 Atherosclerosis of aorta: Secondary | ICD-10-CM | POA: Diagnosis not present

## 2021-03-19 DIAGNOSIS — Z452 Encounter for adjustment and management of vascular access device: Secondary | ICD-10-CM | POA: Diagnosis not present

## 2021-03-19 LAB — AEROBIC/ANAEROBIC CULTURE W GRAM STAIN (SURGICAL/DEEP WOUND)
Culture: NO GROWTH
Culture: NO GROWTH

## 2021-03-20 DIAGNOSIS — G40909 Epilepsy, unspecified, not intractable, without status epilepticus: Secondary | ICD-10-CM | POA: Diagnosis not present

## 2021-03-20 DIAGNOSIS — Z48811 Encounter for surgical aftercare following surgery on the nervous system: Secondary | ICD-10-CM | POA: Diagnosis not present

## 2021-03-20 DIAGNOSIS — R7881 Bacteremia: Secondary | ICD-10-CM | POA: Diagnosis not present

## 2021-03-20 DIAGNOSIS — G060 Intracranial abscess and granuloma: Secondary | ICD-10-CM | POA: Diagnosis not present

## 2021-03-20 DIAGNOSIS — B9689 Other specified bacterial agents as the cause of diseases classified elsewhere: Secondary | ICD-10-CM | POA: Diagnosis not present

## 2021-03-20 DIAGNOSIS — G939 Disorder of brain, unspecified: Secondary | ICD-10-CM | POA: Diagnosis not present

## 2021-03-21 ENCOUNTER — Other Ambulatory Visit: Payer: Self-pay | Admitting: Radiation Therapy

## 2021-03-21 DIAGNOSIS — G939 Disorder of brain, unspecified: Secondary | ICD-10-CM | POA: Diagnosis not present

## 2021-03-21 DIAGNOSIS — G40909 Epilepsy, unspecified, not intractable, without status epilepticus: Secondary | ICD-10-CM | POA: Diagnosis not present

## 2021-03-21 DIAGNOSIS — Z48811 Encounter for surgical aftercare following surgery on the nervous system: Secondary | ICD-10-CM | POA: Diagnosis not present

## 2021-03-21 DIAGNOSIS — B9689 Other specified bacterial agents as the cause of diseases classified elsewhere: Secondary | ICD-10-CM | POA: Diagnosis not present

## 2021-03-21 DIAGNOSIS — R7881 Bacteremia: Secondary | ICD-10-CM | POA: Diagnosis not present

## 2021-03-21 DIAGNOSIS — G060 Intracranial abscess and granuloma: Secondary | ICD-10-CM | POA: Diagnosis not present

## 2021-03-22 ENCOUNTER — Encounter (HOSPITAL_COMMUNITY): Payer: Self-pay

## 2021-03-22 DIAGNOSIS — R7881 Bacteremia: Secondary | ICD-10-CM | POA: Diagnosis not present

## 2021-03-22 DIAGNOSIS — G939 Disorder of brain, unspecified: Secondary | ICD-10-CM | POA: Diagnosis not present

## 2021-03-22 DIAGNOSIS — B9689 Other specified bacterial agents as the cause of diseases classified elsewhere: Secondary | ICD-10-CM | POA: Diagnosis not present

## 2021-03-22 DIAGNOSIS — G060 Intracranial abscess and granuloma: Secondary | ICD-10-CM | POA: Diagnosis not present

## 2021-03-22 DIAGNOSIS — G40909 Epilepsy, unspecified, not intractable, without status epilepticus: Secondary | ICD-10-CM | POA: Diagnosis not present

## 2021-03-22 DIAGNOSIS — Z48811 Encounter for surgical aftercare following surgery on the nervous system: Secondary | ICD-10-CM | POA: Diagnosis not present

## 2021-03-22 LAB — SURGICAL PATHOLOGY

## 2021-03-26 ENCOUNTER — Inpatient Hospital Stay: Payer: Medicare Other | Attending: Neurological Surgery

## 2021-03-26 DIAGNOSIS — G939 Disorder of brain, unspecified: Secondary | ICD-10-CM | POA: Diagnosis not present

## 2021-03-26 DIAGNOSIS — Z48811 Encounter for surgical aftercare following surgery on the nervous system: Secondary | ICD-10-CM | POA: Diagnosis not present

## 2021-03-26 DIAGNOSIS — C719 Malignant neoplasm of brain, unspecified: Secondary | ICD-10-CM | POA: Insufficient documentation

## 2021-03-26 DIAGNOSIS — G4089 Other seizures: Secondary | ICD-10-CM | POA: Diagnosis not present

## 2021-03-26 DIAGNOSIS — G060 Intracranial abscess and granuloma: Secondary | ICD-10-CM | POA: Diagnosis not present

## 2021-03-26 DIAGNOSIS — R569 Unspecified convulsions: Secondary | ICD-10-CM | POA: Insufficient documentation

## 2021-03-26 DIAGNOSIS — G40909 Epilepsy, unspecified, not intractable, without status epilepticus: Secondary | ICD-10-CM | POA: Diagnosis not present

## 2021-03-26 DIAGNOSIS — Z79899 Other long term (current) drug therapy: Secondary | ICD-10-CM | POA: Insufficient documentation

## 2021-03-26 DIAGNOSIS — R7881 Bacteremia: Secondary | ICD-10-CM | POA: Diagnosis not present

## 2021-03-26 DIAGNOSIS — B9689 Other specified bacterial agents as the cause of diseases classified elsewhere: Secondary | ICD-10-CM | POA: Diagnosis not present

## 2021-03-27 ENCOUNTER — Telehealth: Payer: Self-pay | Admitting: Internal Medicine

## 2021-03-27 DIAGNOSIS — G40909 Epilepsy, unspecified, not intractable, without status epilepticus: Secondary | ICD-10-CM | POA: Diagnosis not present

## 2021-03-27 DIAGNOSIS — G939 Disorder of brain, unspecified: Secondary | ICD-10-CM | POA: Diagnosis not present

## 2021-03-27 DIAGNOSIS — R7881 Bacteremia: Secondary | ICD-10-CM | POA: Diagnosis not present

## 2021-03-27 DIAGNOSIS — Z48811 Encounter for surgical aftercare following surgery on the nervous system: Secondary | ICD-10-CM | POA: Diagnosis not present

## 2021-03-27 DIAGNOSIS — G060 Intracranial abscess and granuloma: Secondary | ICD-10-CM | POA: Diagnosis not present

## 2021-03-27 DIAGNOSIS — B9689 Other specified bacterial agents as the cause of diseases classified elsewhere: Secondary | ICD-10-CM | POA: Diagnosis not present

## 2021-03-27 NOTE — Telephone Encounter (Signed)
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 Andrea [name] has been scheduled to see Dr. Mickeal Skinner on 7/28 at 11am. Appt date and time has been given to the pt's husband.

## 2021-03-28 ENCOUNTER — Telehealth: Payer: Self-pay | Admitting: Internal Medicine

## 2021-03-28 ENCOUNTER — Telehealth: Payer: Self-pay

## 2021-03-28 DIAGNOSIS — G40909 Epilepsy, unspecified, not intractable, without status epilepticus: Secondary | ICD-10-CM | POA: Diagnosis not present

## 2021-03-28 DIAGNOSIS — Z48811 Encounter for surgical aftercare following surgery on the nervous system: Secondary | ICD-10-CM | POA: Diagnosis not present

## 2021-03-28 DIAGNOSIS — B9689 Other specified bacterial agents as the cause of diseases classified elsewhere: Secondary | ICD-10-CM | POA: Diagnosis not present

## 2021-03-28 DIAGNOSIS — G060 Intracranial abscess and granuloma: Secondary | ICD-10-CM | POA: Diagnosis not present

## 2021-03-28 DIAGNOSIS — R7881 Bacteremia: Secondary | ICD-10-CM | POA: Diagnosis not present

## 2021-03-28 DIAGNOSIS — G939 Disorder of brain, unspecified: Secondary | ICD-10-CM | POA: Diagnosis not present

## 2021-03-28 LAB — CULTURE, FUNGUS WITHOUT SMEAR

## 2021-03-28 NOTE — Telephone Encounter (Signed)
Let [facility] know ok to remove picc  Patient has appointment with dr Juleen China on 8/09

## 2021-03-28 NOTE — Telephone Encounter (Signed)
Confirmed with Stanton Kidney at [facility] that they have pull PICC orders for patient.   Beryle Flock, RN

## 2021-03-28 NOTE — Telephone Encounter (Signed)
Mary from [facility] calling for pull PICC orders. She states the neurosurgeon told them that the PICC can be removed. Daneil Dan that we have not seen the patient in clinic yet, but that [facility] orders have the end date as 04/24/21. Daneil Dan to page on-call provider.   Beryle Flock, RN

## 2021-03-29 ENCOUNTER — Other Ambulatory Visit: Payer: Self-pay

## 2021-03-29 ENCOUNTER — Inpatient Hospital Stay (HOSPITAL_BASED_OUTPATIENT_CLINIC_OR_DEPARTMENT_OTHER): Payer: Medicare Other | Admitting: Internal Medicine

## 2021-03-29 VITALS — BP 139/78 | HR 87 | Temp 98.5°F | Resp 18 | Ht 66.0 in | Wt 143.8 lb

## 2021-03-29 DIAGNOSIS — C719 Malignant neoplasm of brain, unspecified: Secondary | ICD-10-CM | POA: Diagnosis not present

## 2021-03-29 DIAGNOSIS — R569 Unspecified convulsions: Secondary | ICD-10-CM

## 2021-03-29 DIAGNOSIS — Z79899 Other long term (current) drug therapy: Secondary | ICD-10-CM | POA: Diagnosis not present

## 2021-03-29 NOTE — Progress Notes (Signed)
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 fewer admixed macrophages (CD163,  CD68).  A Ki-67 proliferative index is increased.   The findings are those of a high-grade infiltrating glioma with  immunohistochemical evidence supporting the IDH-wild-type tumor.  Many  IDH-wild-type infiltrating gliomas in this age group show molecular  alterations diagnostic of glioblastoma, and molecular NGS testing may be  performed for further characterization if clinically indicated and upon  written request of the treating physician. "   Please see the patient's electronic medical record for a scanned copy of  this report.    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  She presents today with clinical and radiographic syndrome consistent with multifocal high grade glioma, with genetic and histologic features of glioblastoma.  We had an extensive conversation with her and her family regarding pathology, prognosis, and available treatment pathways.  Her prognosis is additionally  limited by lack of resection, extensive multifocal disease in bilateral hemispheres, and age >28.   We ultimately recommended proceeding with abbreviated course of intensity modulated radiation therapy and concurrent daily Temozolomide.  Radiation will be administered Mon-Fri over just 3 weeks, Temodar will be dosed at 41m/m2 to be given daily over 21 days.  We reviewed side effects of temodar, including fatigue, nausea/vomiting, constipation, and cytopenias.  Informed consent was verbally obtained at bedside to proceed with oral chemotherapy.  Chemotherapy should be held for the following:  ANC less than 1,000  Platelets less than 100,000  LFT or creatinine greater than 2x ULN  If clinical concerns/contraindications develop  During second week of radiation, labs will be checked accompanied by a clinical evaluation in the brain tumor clinic.  For seizures, she will continue Keppra 1006mBID as prior.  Screening for potential clinical trials was performed and discussed using eligibility criteria for active protocols at [facility], as well as national database available on [email or web address]   The patient is not a candidate for a research protocol at this time due to no suitable study identified.   We spent twenty additional minutes teaching regarding the natural history, biology, and historical experience in the treatment of brain tumors. We then discussed in detail the current recommendations for therapy focusing on the mode of administration, mechanism of action, anticipated toxicities, and quality of life issues associated with this plan. We also provided teaching sheets for the patient to take home as an additional resource.  All questions were answered. The patient knows to call the clinic with any problems, questions or concerns. No barriers to learning  were detected.  The total time spent in the encounter was 60 minutes and more than 50% was on  counseling and review of test results   Ventura Sellers, MD Medical Director of Neuro-Oncology [facility] [place] 03/29/21 4:45 PM

## 2021-03-30 ENCOUNTER — Other Ambulatory Visit: Payer: Self-pay | Admitting: Radiation Therapy

## 2021-03-30 ENCOUNTER — Telehealth: Payer: Self-pay | Admitting: Radiation Oncology

## 2021-03-30 DIAGNOSIS — C719 Malignant neoplasm of brain, unspecified: Secondary | ICD-10-CM

## 2021-04-02 DIAGNOSIS — G40909 Epilepsy, unspecified, not intractable, without status epilepticus: Secondary | ICD-10-CM | POA: Diagnosis not present

## 2021-04-02 DIAGNOSIS — R7881 Bacteremia: Secondary | ICD-10-CM | POA: Diagnosis not present

## 2021-04-02 DIAGNOSIS — G060 Intracranial abscess and granuloma: Secondary | ICD-10-CM | POA: Diagnosis not present

## 2021-04-02 DIAGNOSIS — G939 Disorder of brain, unspecified: Secondary | ICD-10-CM | POA: Diagnosis not present

## 2021-04-02 DIAGNOSIS — Z48811 Encounter for surgical aftercare following surgery on the nervous system: Secondary | ICD-10-CM | POA: Diagnosis not present

## 2021-04-02 DIAGNOSIS — B9689 Other specified bacterial agents as the cause of diseases classified elsewhere: Secondary | ICD-10-CM | POA: Diagnosis not present

## 2021-04-04 DIAGNOSIS — R7881 Bacteremia: Secondary | ICD-10-CM | POA: Diagnosis not present

## 2021-04-04 DIAGNOSIS — B9689 Other specified bacterial agents as the cause of diseases classified elsewhere: Secondary | ICD-10-CM | POA: Diagnosis not present

## 2021-04-04 DIAGNOSIS — G060 Intracranial abscess and granuloma: Secondary | ICD-10-CM | POA: Diagnosis not present

## 2021-04-04 DIAGNOSIS — G939 Disorder of brain, unspecified: Secondary | ICD-10-CM | POA: Diagnosis not present

## 2021-04-04 DIAGNOSIS — G40909 Epilepsy, unspecified, not intractable, without status epilepticus: Secondary | ICD-10-CM | POA: Diagnosis not present

## 2021-04-04 DIAGNOSIS — Z48811 Encounter for surgical aftercare following surgery on the nervous system: Secondary | ICD-10-CM | POA: Diagnosis not present

## 2021-04-05 DIAGNOSIS — R7881 Bacteremia: Secondary | ICD-10-CM | POA: Diagnosis not present

## 2021-04-05 DIAGNOSIS — B9689 Other specified bacterial agents as the cause of diseases classified elsewhere: Secondary | ICD-10-CM | POA: Diagnosis not present

## 2021-04-05 DIAGNOSIS — G060 Intracranial abscess and granuloma: Secondary | ICD-10-CM | POA: Diagnosis not present

## 2021-04-05 DIAGNOSIS — G939 Disorder of brain, unspecified: Secondary | ICD-10-CM | POA: Diagnosis not present

## 2021-04-05 DIAGNOSIS — Z48811 Encounter for surgical aftercare following surgery on the nervous system: Secondary | ICD-10-CM | POA: Diagnosis not present

## 2021-04-05 DIAGNOSIS — G40909 Epilepsy, unspecified, not intractable, without status epilepticus: Secondary | ICD-10-CM | POA: Diagnosis not present

## 2021-04-06 NOTE — Progress Notes (Signed)
Location/Histology of Brain Tumor:  03/13/2021 FINAL MICROSCOPIC DIAGNOSIS:  A and B. BRAIN, RIGHT PARIETAL, EXCISION:  -  High-grade infiltrating glioma, consistent with IDH-wild-type by immunohistochemistry  COMMENT:  Tissue sections show an infiltrating astrocytoma with prominent gemistocytes and scattered mitotic figures.  No definitive necrosis or microvascular proliferation is  identified.  Immunohistochemical  studies performed at [facility] show the neoplastic cells are  positive for GFAP and Olig2, but are negative for IDH1 (R132H mutant protein) and CAM5.2.  ATRX is retained.  P53 highlights scattered neoplastic cells.  Synaptophysin highlights the infiltrative nature of the neoplasm.  CD45 highlights scattered admixed inflammatory cells, which are predominantly microglia and fewer admixed macrophages (CD163, CD68).  A Ki-67 proliferative index is increased  Patient presented with symptoms of:  presented to medical attention earlier this month (July 2022) with first ever seizure.  She was driving her car, was found minimally responsive along the highway; patient is amnestic of the event. CNS imaging demonstrated multifocal enhancing masses  Past or anticipated interventions, if any, per neurosurgery:  03/13/2021 --Dr. Pieter Partridge Dawley Right parietal occipital stereotactic craniotomy for open biopsy of lesion Use of stereotaxy, BrainLab Intraoperative use of microscope for microdissection  Past or anticipated interventions, if any, per medical oncology:  Under care of Dr. Cecil Cobbs 03/29/2021 --Her prognosis is additionally limited by lack of resection, extensive multifocal disease in bilateral hemispheres, and age >48.  --We ultimately recommended proceeding with abbreviated course of intensity modulated radiation therapy and concurrent daily Temozolomide. --Radiation will be administered Mon-Fri over just 3 weeks, Temodar will be dosed at 19m/m2 to be given daily over 21  days. --During second week of radiation, labs will be checked accompanied by a clinical evaluation in the brain tumor clinic  Dose of Decadron, if applicable: Not currently ordered  Recent neurologic symptoms, if any:  Seizures: No new occurences since initial seizure that caused hospitalization Headaches: Patient denies Nausea: Patient denies (reports a stable/healthy appetite) Dizziness/ataxia: Patient denies (reports rare instances of feeling "wobbly" when she first stands, but denies any falls or need or assistive device) Difficulty with hand coordination: Patient denies Focal numbness/weakness: Patient denies Visual deficits/changes: Patient denies any new concerns (reports diminished peripheral vision to her left eye--things must be placed directly in front of her if coming from the lefthand side) Confusion/Memory deficits: Reports difficulty with short term memory.   SAFETY ISSUES: Prior radiation? No Pacemaker/ICD? No Possible current pregnancy? No--postmenopausal  Is the patient on methotrexate? No  Additional Complaints / other details: Patient has received the first 3 Moderna vaccines as well, as a Moderna booster

## 2021-04-09 NOTE — Progress Notes (Signed)
Radiation Oncology         [phone] ________________________________  Initial Outpatient Consultation by telemedicine.  The patient opted for telemedicine to maximize safety during the pandemic.  MyChart video was used  Name: Andrea [name] MRN: [id number]  Date: 04/10/2021  DOB: November 19, 1945  TD:DUKG, Edwinna Areola, MD  Ventura Sellers, MD   REFERRING PHYSICIAN: Ventura Sellers, MD  DIAGNOSIS: C71.2   ICD-10-CM   1. 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 female who presented to [facility] ED on 03/06/21 following a seizure episode. The patient was [place] in her car [place] the middle of [place] by EMS, where they witnessed the patient to have 3 seizures. Upon arrival to the ED, the patient was minimally responsive and subsequently intubated. CT of the head at that time revealed an abnormal hypodensity within the anteromedial right temporal lobe/right hippocampus; noted to be either 2/2 acute right PCA territory infarct vs seizure related edema vs encephalitis vs underlying mass.   MRI of the brain on 03/07/21 demonstrated numerous contrast-enhancing lesions bilaterally within the temporal and occipital lobes. Primary  considerations were noted to include severe cerebritis versus a neoplastic process, such as multifocal glioma or metastatic disease. However, the distribution of lesions were noted to be atypical for metastatic disease.   Due to the patient experiencing bouts of nausea and vomiting while admitted, she received an abdominal US on 03/08/21. Findings from imaging were limited, however a 6 mm non-shadowing echo density consistent with non- shadowing gallstone or tumefactive sludge was noted within the gallbladder; no evidence of cholecystitis or bilary distention was seen. Imaging additionally showed a 4.5 cm simple left renal cyst, increased hepatic echogenicity consistent with fatty infiltration or hepatocellular disease, and a slightly nodular hepatic contour suggesting the possibility of cirrhosis.  CT of the head on 03/09/21 demonstrated findings consistent with recent MRI on 03/07/21.  I have reviewed her images personally  The patient subsequently underwent right parietal-occipital craniotomy for right brain lesion biopsy on 03/13/21 under Dr. Reatha Armour. Surgical pathology revealed: high-grade infiltrating glioma, consistent with IDH-wild-type by immunohistochemistry (Dx provided by [facility]; Dr. Reather Laurence).  Accordingly, the patient was referred to Dr. Mickeal Skinner for consultation on 03/29/21. Per the visit note, the patient's family stated that she has had a recent history of difficulty at home navigating from room to room, and forgetting at times how to get to the bathroom. They also described her to have impaired short term memory, forgetting conversations that she had minutes earlier. She is noted otherwise to have been able to walk independently, with no help needed for daily tasks. Neurologic exam performed during this visit revealed the patient to display some elements of agnosia, visual spatial neglect, and anterograde amnesia. Following review  of her condition with recent imaging and  biopsy, Dr. Mickeal Skinner recommended for the patient to undergo a course of intensity modulated radiation therapy and concurrent daily Temozolomide, with radiation administered Mon-Fri over 3 weeks. Of note: the patient consented at bedside to proceed with oral chemotherapy.  Dose of Decadron, if applicable: Not currently ordered  Recent neurologic symptoms, if any:  Seizures: No new occurences since initial seizure that caused hospitalization Headaches: Patient denies Nausea: Patient denies (reports a stable/healthy appetite) Dizziness/ataxia: Patient denies (reports rare instances of feeling "wobbly" when she first stands, but denies any falls or need or assistive device) Difficulty with hand coordination: Patient denies Focal numbness/weakness: Patient denies Visual deficits/changes: Patient denies any new concerns (reports diminished peripheral vision to her left eye--things must be placed directly in front of her if coming from the lefthand side) Confusion/Memory deficits: Reports difficulty with short term memory.   SAFETY ISSUES: Prior radiation? No Pacemaker/ICD? No Possible current pregnancy? No--postmenopausal  Is the patient on methotrexate? No  Additional Complaints / other details: Patient has received the first 3 Moderna vaccines as well, as a Moderna booster   PREVIOUS RADIATION THERAPY: No  PAST MEDICAL HISTORY:  has a past medical history of Hypertension.    PAST SURGICAL HISTORY: Past Surgical History:  Procedure Laterality Date   APPLICATION OF CRANIAL NAVIGATION N/A 03/13/2021   Procedure: APPLICATION OF CRANIAL NAVIGATION;  Surgeon: Dawley, Theodoro Doing, DO;  Location: [place];  Service: Neurosurgery;  Laterality: N/A;   FRAMELESS  BIOPSY WITH BRAINLAB Right 03/13/2021   Procedure: Right Parietal-Occipital craniotomy, open biopsy for brain lesion;  Surgeon: Dawley, Theodoro Doing, DO;  Location: [place];  Service: Neurosurgery;  Laterality: Right;    FAMILY HISTORY: family history is  not on file.  SOCIAL HISTORY: Per her daughter, the patient has a long standing smoking history and quit 4-5 years ago (having at least a 50 pack year history)   reports that she has never smoked. She has never used smokeless tobacco. She reports that she does not currently use alcohol. She reports that she does not use drugs.  ALLERGIES: Penicillins  MEDICATIONS:  Current Outpatient Medications  Medication Sig Dispense Refill   ALPRAZolam (XANAX) 0.25 MG tablet Take 0.25 mg by mouth 3 (three) times daily as needed.     benazepril-hydrochlorthiazide (LOTENSIN HCT) 10-12.5 MG tablet Take 1 tablet by mouth 2 (two) times daily.     ondansetron (ZOFRAN) 4 MG tablet Take 4 mg by mouth 2 (two) times daily as needed.     Apoaequorin (PREVAGEN PO) Take 1 Dose by mouth daily. (Patient not taking: Reported on 04/10/2021)     Bismuth Subsalicylate (PEPTO-BISMOL PO) Take 1 Dose by mouth as needed (heartburn). (Patient not taking: Reported on 04/10/2021)     cefTRIAXone (ROCEPHIN) IVPB Inject 2 g into the vein every 12 (twelve) hours. Indication:  possible brain abscess First Dose: No Last Day of Therapy:  04/24/2021 Labs - Once weekly:  CBC/D and BMP, Labs - Every other week:  ESR and CRP Method of administration: IV Push Method of administration may be changed at the discretion of home infusion pharmacist based upon assessment of the patient and/or caregiver's ability to self-administer the medication ordered. (Patient not taking: No sig reported) 78 Units 0   Cyanocobalamin (VITAMIN B-12 PO) Take 1 tablet by mouth daily. (Patient not taking: No sig reported)     diclofenac (VOLTAREN) 75 MG EC tablet Take 75 mg by mouth 2 (two) times daily as needed (  inflammation). (Patient not taking: No sig reported)     levETIRAcetam (KEPPRA) 1000 MG tablet Take 1 tablet (1,000 mg total) by mouth 2 (two) times daily. 60 tablet 3   metroNIDAZOLE (FLAGYL) 500 MG tablet Take 1 tablet (500 mg total) by mouth 3 (three) times  daily. (Patient not taking: No sig reported) 117 tablet 0   ondansetron (ZOFRAN) 8 MG tablet Take 1 tablet by mouth 2 times daily as needed (nausea and vomiting). May take 30 - 60 minutes prior to Temodar administration if nausea/vomiting occurs. 30 tablet 1   pantoprazole (PROTONIX) 40 MG tablet Take 1 tablet (40 mg total) by mouth at bedtime. 30 tablet 3   [place] PO Take 1 Dose by mouth daily. (Patient not taking: No sig reported)     temozolomide (TEMODAR) 100 MG capsule Take 1 capsule (100 mg total) by mouth daily. May take on an empty stomach to decrease nausea & vomiting. 21 capsule 0   temozolomide (TEMODAR) 20 MG capsule Take 1 capsule (20 mg total) by mouth daily. May take on an empty stomach to decrease nausea & vomiting. 21 capsule 0   No current facility-administered medications for this encounter.    REVIEW OF SYSTEMS:  Notable for that above.   PHYSICAL EXAM:  vitals were not taken for this visit.   General: Alert and oriented, in no acute distress     LABORATORY DATA:  Lab Results  Component Value Date   WBC 7.4 03/17/2021   HGB 10.1 (L) 03/17/2021   HCT 30.9 (L) 03/17/2021   MCV 98.4 03/17/2021   PLT 272 03/17/2021   CMP     Component Value Date/Time   NA 137 03/17/2021 0550   K 2.9 (L) 03/17/2021 0550   CL 106 03/17/2021 0550   CO2 27 03/17/2021 0550   GLUCOSE 112 (H) 03/17/2021 0550   BUN 9 03/17/2021 0550   CREATININE 0.63 03/17/2021 0550   CALCIUM 8.6 (L) 03/17/2021 0550   PROT 6.4 (L) 03/07/2021 1134   ALBUMIN 3.3 (L) 03/07/2021 1134   AST 35 03/07/2021 1134   ALT 24 03/07/2021 1134   ALKPHOS 57 03/07/2021 1134   BILITOT 0.9 03/07/2021 1134   GFRNONAA >60 03/17/2021 0550         RADIOGRAPHY: [place] EKG SITE RITE  Result Date: 03/16/2021 If Site Rite image not attached, placement could not be confirmed due to current cardiac rhythm.     IMPRESSION/PLAN: Multifocal glioblastoma, unresectable   Today, I talked to the patient and her  significant other, as well as her daughter, about the findings and work-up thus far.  We discussed the patient's diagnosis of multifocal unresectable glioblastoma and general treatment for this, highlighting the role of radiotherapy in the management.  We discussed the available palliative radiation techniques, and focused on the details of logistics and delivery.     Dr. Mickeal Skinner and I have conferred and we recommend that she undergo a hypofractionated course of palliative partial brain radiation with concurrent Temodar.  We discussed the risks, benefits, and side effects of a 3-week course of hypofractionated partial brain radiotherapy. Side effects may include but not necessarily be limited to: Skin irritation, patchy hair loss, fatigue, headaches, cognitive and/or neurologic decline, serious injury to the brain; no guarantees of treatment were given.  We discussed why the potential benefits of radiation outweigh the risks, given the aggressiveness of her tumor.  She and her family are enthusiastic to proceed. The patient was encouraged to ask questions  that I answered to the best of my ability.   She looks forward to coming in to our clinic in the near future for treatment planning.  Our team will contact medical oncology to coordinate care and make sure that her Temodar has been ordered.  The patient does not have this medication yet.  I look forward to meeting her in person in the near future.  This encounter was provided by telemedicine platform; patient desired telemedicine during pandemic precautions.  MyChart video was used was used. The patient has given verbal consent for this type of encounter and has been advised to only accept a meeting of this type in a secure network environment. On date of service, in total, I spent 45 minutes on this encounter.   The attendants for this meeting include Eppie Gibson  and Erlene Quan During the encounter, Eppie Gibson was located at [facility] Radiation Oncology Department.  Bernetta Sutley was located at home.    __________________________________________   Eppie Gibson, MD  This document serves as a record of services personally performed by Eppie Gibson, MD. It was created on her behalf by Roney Mans, a trained medical scribe. The creation of this record is based on the scribe's personal observations and the provider's statements to them. This document has been checked and approved by the attending provider.

## 2021-04-10 ENCOUNTER — Ambulatory Visit
Admission: RE | Admit: 2021-04-10 | Discharge: 2021-04-10 | Disposition: A | Discharge status: Home / Self Care | Payer: Medicare Other | Source: Ambulatory Visit | Attending: Radiation Oncology | Admitting: Radiation Oncology

## 2021-04-10 ENCOUNTER — Ambulatory Visit: Payer: Medicare Other | Admitting: Internal Medicine

## 2021-04-10 ENCOUNTER — Encounter: Payer: Self-pay | Admitting: Radiation Oncology

## 2021-04-10 DIAGNOSIS — Z48811 Encounter for surgical aftercare following surgery on the nervous system: Secondary | ICD-10-CM | POA: Diagnosis not present

## 2021-04-10 DIAGNOSIS — C712 Malignant neoplasm of temporal lobe: Secondary | ICD-10-CM | POA: Diagnosis not present

## 2021-04-10 DIAGNOSIS — B9689 Other specified bacterial agents as the cause of diseases classified elsewhere: Secondary | ICD-10-CM | POA: Diagnosis not present

## 2021-04-10 DIAGNOSIS — R7881 Bacteremia: Secondary | ICD-10-CM | POA: Diagnosis not present

## 2021-04-10 DIAGNOSIS — G40909 Epilepsy, unspecified, not intractable, without status epilepticus: Secondary | ICD-10-CM | POA: Diagnosis not present

## 2021-04-10 DIAGNOSIS — G939 Disorder of brain, unspecified: Secondary | ICD-10-CM | POA: Diagnosis not present

## 2021-04-10 DIAGNOSIS — G060 Intracranial abscess and granuloma: Secondary | ICD-10-CM | POA: Diagnosis not present

## 2021-04-10 DIAGNOSIS — C719 Malignant neoplasm of brain, unspecified: Secondary | ICD-10-CM

## 2021-04-11 ENCOUNTER — Other Ambulatory Visit: Payer: Self-pay | Admitting: Internal Medicine

## 2021-04-11 ENCOUNTER — Encounter: Payer: Self-pay | Admitting: Internal Medicine

## 2021-04-11 DIAGNOSIS — G40909 Epilepsy, unspecified, not intractable, without status epilepticus: Secondary | ICD-10-CM | POA: Diagnosis not present

## 2021-04-11 DIAGNOSIS — R7881 Bacteremia: Secondary | ICD-10-CM | POA: Diagnosis not present

## 2021-04-11 DIAGNOSIS — G060 Intracranial abscess and granuloma: Secondary | ICD-10-CM | POA: Diagnosis not present

## 2021-04-11 DIAGNOSIS — Z48811 Encounter for surgical aftercare following surgery on the nervous system: Secondary | ICD-10-CM | POA: Diagnosis not present

## 2021-04-11 DIAGNOSIS — G939 Disorder of brain, unspecified: Secondary | ICD-10-CM | POA: Diagnosis not present

## 2021-04-11 DIAGNOSIS — B9689 Other specified bacterial agents as the cause of diseases classified elsewhere: Secondary | ICD-10-CM | POA: Diagnosis not present

## 2021-04-11 MED ORDER — PANTOPRAZOLE SODIUM 40 MG PO TBEC: 40.0000 mg | DELAYED_RELEASE_TABLET | Freq: Every day | ORAL | 3 refills | Status: DC

## 2021-04-11 MED ORDER — LEVETIRACETAM 1000 MG PO TABS: 1000.0000 mg | ORAL_TABLET | Freq: Two times a day (BID) | ORAL | 3 refills | Status: DC

## 2021-04-12 ENCOUNTER — Encounter: Payer: Self-pay | Admitting: Internal Medicine

## 2021-04-12 ENCOUNTER — Other Ambulatory Visit: Payer: Self-pay | Admitting: Internal Medicine

## 2021-04-12 ENCOUNTER — Telehealth: Payer: Self-pay

## 2021-04-12 ENCOUNTER — Other Ambulatory Visit (HOSPITAL_COMMUNITY): Payer: Self-pay

## 2021-04-12 ENCOUNTER — Telehealth: Payer: Self-pay | Admitting: Pharmacist

## 2021-04-12 DIAGNOSIS — C719 Malignant neoplasm of brain, unspecified: Secondary | ICD-10-CM

## 2021-04-12 LAB — FUNGAL ORGANISM REFLEX

## 2021-04-12 LAB — FUNGUS CULTURE RESULT

## 2021-04-12 LAB — FUNGUS CULTURE WITH STAIN

## 2021-04-12 MED ORDER — TEMOZOLOMIDE 100 MG PO CAPS
100.0000 mg | ORAL_CAPSULE | Freq: Every day | ORAL | 0 refills | Status: DC
  Filled 2021-04-12: qty 25, 25d supply, fill #0
  Filled 2021-04-16: qty 21, 21d supply, fill #0

## 2021-04-12 MED ORDER — TEMOZOLOMIDE 20 MG PO CAPS
20.0000 mg | ORAL_CAPSULE | Freq: Every day | ORAL | 0 refills | Status: DC
  Filled 2021-04-12: qty 28, 28d supply, fill #0
  Filled 2021-04-16: qty 21, 21d supply, fill #0

## 2021-04-12 MED ORDER — ONDANSETRON HCL 8 MG PO TABS
8.0000 mg | ORAL_TABLET | Freq: Two times a day (BID) | ORAL | 1 refills | Status: DC | PRN
  Filled 2021-04-12 – 2021-04-16 (×2): qty 30, 15d supply, fill #0

## 2021-04-12 NOTE — Progress Notes (Signed)
START ON PATHWAY REGIMEN - Neuro     One cycle, concurrent with RT:     Temozolomide   **Always confirm dose/schedule in your pharmacy ordering system**  Patient Characteristics: Glioblastoma (Grade 4 Glioma), Newly Diagnosed / Treatment Naive, Poor Performance Status and/or Elderly Patient Disease Classification: Glioma Disease Classification: Glioblastoma (Grade 4 Glioma) Disease Status: Newly Diagnosed / Treatment Naive Performance Status: Poor Performance Status and/or Elderly Patient Intent of Therapy: Non-Curative / Palliative Intent, Discussed with Patient

## 2021-04-12 NOTE — Telephone Encounter (Signed)
Oral Oncology Patient Advocate Encounter  After completing a benefits investigation, prior authorization for Temozolomide is not required at this time through Medicare B.  Patient's copay is $26.22 for '100mg'$        $9.08 for '20mg'$   [place] Patient [place] Phone [phone] Fax [phone] 04/12/2021 1:48 PM

## 2021-04-12 NOTE — Telephone Encounter (Signed)
Oral Oncology Pharmacist Encounter  Received new prescription for Temodar (temozolomide) for the treatment of multifocal high grade glioma in conjunction with radiation, planned duration 21 days.  Prescription dose and frequency assessed for appropriateness.   CBC w/ Diff and BMP from 03/27/21 assessed, no relevant lab abnormalities noted.  Current medication list in Epic reviewed, no relevant/significant DDIs with Temodar identified.  Evaluated chart and no patient barriers to medication adherence noted.   Patient agreement for treatment documented in MD note on 03/29/21.  Prescription has been e-scribed to the [facility] for benefits analysis and approval.  Oral Oncology Clinic will continue to follow for insurance authorization, copayment issues, initial counseling and start date.  Leron Croak, PharmD, BCPS Hematology/Oncology Clinical Pharmacist [facility] [phone] 04/12/2021 12:52 PM

## 2021-04-13 ENCOUNTER — Ambulatory Visit
Admission: RE | Admit: 2021-04-13 | Discharge: 2021-04-13 | Disposition: A | Discharge status: Home / Self Care | Payer: Medicare Other | Source: Ambulatory Visit | Attending: Radiation Oncology | Admitting: Radiation Oncology

## 2021-04-13 ENCOUNTER — Other Ambulatory Visit (HOSPITAL_COMMUNITY): Payer: Self-pay

## 2021-04-13 ENCOUNTER — Encounter: Payer: Self-pay | Admitting: Radiation Oncology

## 2021-04-13 ENCOUNTER — Other Ambulatory Visit: Payer: Self-pay

## 2021-04-13 DIAGNOSIS — G060 Intracranial abscess and granuloma: Secondary | ICD-10-CM | POA: Diagnosis not present

## 2021-04-13 DIAGNOSIS — B9689 Other specified bacterial agents as the cause of diseases classified elsewhere: Secondary | ICD-10-CM | POA: Diagnosis not present

## 2021-04-13 DIAGNOSIS — G939 Disorder of brain, unspecified: Secondary | ICD-10-CM | POA: Diagnosis not present

## 2021-04-13 DIAGNOSIS — C712 Malignant neoplasm of temporal lobe: Secondary | ICD-10-CM | POA: Insufficient documentation

## 2021-04-13 DIAGNOSIS — C718 Malignant neoplasm of overlapping sites of brain: Secondary | ICD-10-CM | POA: Diagnosis not present

## 2021-04-13 DIAGNOSIS — R7881 Bacteremia: Secondary | ICD-10-CM | POA: Diagnosis not present

## 2021-04-13 DIAGNOSIS — Z510 Encounter for antineoplastic radiation therapy: Secondary | ICD-10-CM | POA: Insufficient documentation

## 2021-04-13 DIAGNOSIS — Z48811 Encounter for surgical aftercare following surgery on the nervous system: Secondary | ICD-10-CM | POA: Diagnosis not present

## 2021-04-13 DIAGNOSIS — G40909 Epilepsy, unspecified, not intractable, without status epilepticus: Secondary | ICD-10-CM | POA: Diagnosis not present

## 2021-04-16 ENCOUNTER — Other Ambulatory Visit (HOSPITAL_COMMUNITY): Payer: Self-pay

## 2021-04-16 DIAGNOSIS — G060 Intracranial abscess and granuloma: Secondary | ICD-10-CM | POA: Diagnosis not present

## 2021-04-16 DIAGNOSIS — G939 Disorder of brain, unspecified: Secondary | ICD-10-CM | POA: Diagnosis not present

## 2021-04-16 DIAGNOSIS — B9689 Other specified bacterial agents as the cause of diseases classified elsewhere: Secondary | ICD-10-CM | POA: Diagnosis not present

## 2021-04-16 DIAGNOSIS — G40909 Epilepsy, unspecified, not intractable, without status epilepticus: Secondary | ICD-10-CM | POA: Diagnosis not present

## 2021-04-16 DIAGNOSIS — Z48811 Encounter for surgical aftercare following surgery on the nervous system: Secondary | ICD-10-CM | POA: Diagnosis not present

## 2021-04-16 DIAGNOSIS — R7881 Bacteremia: Secondary | ICD-10-CM | POA: Diagnosis not present

## 2021-04-16 NOTE — Telephone Encounter (Signed)
Oral Chemotherapy Pharmacist Encounter  I spoke with patient's husband, Ky Harpole, for overview of: Temodar for the treatment of multifocal high grade glioma in conjunction with radiation, planned duration 21 days.  Counseled on administration, dosing, side effects, monitoring, drug-food interactions, safe handling, storage, and disposal.  Patient will take Temodar '20mg'$  capsules and Temodar '100mg'$  capsules, 120 mg total daily dose, by mouth once daily, may take at bedtime and on an empty stomach to decrease nausea and vomiting.  Patient will take Temodar concurrent with radiation for 21 days straight.  Temodar start date: 04/22/21 PM Radiation start date: 04/23/21   Patient will take Zofran '8mg'$  tablet, 1 tablet by mouth 30-60 min prior to Temodar dose to help decrease N/V once starting adjuvant therapy. Prophylactic Zofran will not be used at initiation of concurrent phase, but will be initiated if nausea develops despite Temodar administration on an empty stomach and at bedtime.   Adverse effects include but are not limited to: nausea, vomiting, anorexia, GI upset, rash, drug fever, and fatigue. Rare but serious adverse effects of pneumocystis pneumonia and secondary malignancy also discussed.  PCP prophylaxis will not be initiated at this time, but may be added based on lymphocyte count in the future.  Reviewed with patient importance of keeping a medication schedule and plan for any missed doses. No barriers to medication adherence identified.  Medication reconciliation performed and medication/allergy list updated.  Insurance authorization for Temodar has been obtained. Test claim at the pharmacy revealed copayment $35.30 for 1st fill of Temodar. This will ship from the [place] on 04/16/21 to deliver to patient's home on 04/17/21.  Patient and husband informed the pharmacy will reach out 5-7 days prior to needing next fill of Temodar to coordinate continued  medication acquisition to prevent break in therapy.   All questions answered.  Mr. and Ms. Jarnigan voiced understanding and appreciation.   Medication education handout placed in mail for patient. Patient and patient's husband know to call the office with questions or concerns. Oral Chemotherapy Clinic phone number provided.   Leron Croak, PharmD, BCPS Hematology/Oncology Clinical Pharmacist [facility] [phone] 04/16/2021 12:13 PM

## 2021-04-17 DIAGNOSIS — G939 Disorder of brain, unspecified: Secondary | ICD-10-CM | POA: Diagnosis not present

## 2021-04-17 DIAGNOSIS — Z48811 Encounter for surgical aftercare following surgery on the nervous system: Secondary | ICD-10-CM | POA: Diagnosis not present

## 2021-04-17 DIAGNOSIS — R7881 Bacteremia: Secondary | ICD-10-CM | POA: Diagnosis not present

## 2021-04-17 DIAGNOSIS — G060 Intracranial abscess and granuloma: Secondary | ICD-10-CM | POA: Diagnosis not present

## 2021-04-17 DIAGNOSIS — G40909 Epilepsy, unspecified, not intractable, without status epilepticus: Secondary | ICD-10-CM | POA: Diagnosis not present

## 2021-04-17 DIAGNOSIS — B9689 Other specified bacterial agents as the cause of diseases classified elsewhere: Secondary | ICD-10-CM | POA: Diagnosis not present

## 2021-04-18 DIAGNOSIS — Z792 Long term (current) use of antibiotics: Secondary | ICD-10-CM | POA: Diagnosis not present

## 2021-04-18 DIAGNOSIS — Z452 Encounter for adjustment and management of vascular access device: Secondary | ICD-10-CM | POA: Diagnosis not present

## 2021-04-18 DIAGNOSIS — C718 Malignant neoplasm of overlapping sites of brain: Secondary | ICD-10-CM | POA: Diagnosis not present

## 2021-04-18 DIAGNOSIS — Z9181 History of falling: Secondary | ICD-10-CM | POA: Diagnosis not present

## 2021-04-18 DIAGNOSIS — G40909 Epilepsy, unspecified, not intractable, without status epilepticus: Secondary | ICD-10-CM | POA: Diagnosis not present

## 2021-04-18 DIAGNOSIS — B9689 Other specified bacterial agents as the cause of diseases classified elsewhere: Secondary | ICD-10-CM | POA: Diagnosis not present

## 2021-04-18 DIAGNOSIS — Z510 Encounter for antineoplastic radiation therapy: Secondary | ICD-10-CM | POA: Diagnosis not present

## 2021-04-18 DIAGNOSIS — Z48811 Encounter for surgical aftercare following surgery on the nervous system: Secondary | ICD-10-CM | POA: Diagnosis not present

## 2021-04-18 DIAGNOSIS — R7881 Bacteremia: Secondary | ICD-10-CM | POA: Diagnosis not present

## 2021-04-18 DIAGNOSIS — C712 Malignant neoplasm of temporal lobe: Secondary | ICD-10-CM | POA: Diagnosis not present

## 2021-04-18 DIAGNOSIS — D72829 Elevated white blood cell count, unspecified: Secondary | ICD-10-CM | POA: Diagnosis not present

## 2021-04-18 DIAGNOSIS — G060 Intracranial abscess and granuloma: Secondary | ICD-10-CM | POA: Diagnosis not present

## 2021-04-18 DIAGNOSIS — Z87891 Personal history of nicotine dependence: Secondary | ICD-10-CM | POA: Diagnosis not present

## 2021-04-18 DIAGNOSIS — I119 Hypertensive heart disease without heart failure: Secondary | ICD-10-CM | POA: Diagnosis not present

## 2021-04-18 DIAGNOSIS — I700 Atherosclerosis of aorta: Secondary | ICD-10-CM | POA: Diagnosis not present

## 2021-04-18 DIAGNOSIS — G939 Disorder of brain, unspecified: Secondary | ICD-10-CM | POA: Diagnosis not present

## 2021-04-19 DIAGNOSIS — G060 Intracranial abscess and granuloma: Secondary | ICD-10-CM | POA: Diagnosis not present

## 2021-04-19 DIAGNOSIS — Z48811 Encounter for surgical aftercare following surgery on the nervous system: Secondary | ICD-10-CM | POA: Diagnosis not present

## 2021-04-19 DIAGNOSIS — G40909 Epilepsy, unspecified, not intractable, without status epilepticus: Secondary | ICD-10-CM | POA: Diagnosis not present

## 2021-04-19 DIAGNOSIS — G939 Disorder of brain, unspecified: Secondary | ICD-10-CM | POA: Diagnosis not present

## 2021-04-19 DIAGNOSIS — R7881 Bacteremia: Secondary | ICD-10-CM | POA: Diagnosis not present

## 2021-04-19 DIAGNOSIS — B9689 Other specified bacterial agents as the cause of diseases classified elsewhere: Secondary | ICD-10-CM | POA: Diagnosis not present

## 2021-04-23 ENCOUNTER — Telehealth: Payer: Self-pay | Admitting: Internal Medicine

## 2021-04-23 ENCOUNTER — Other Ambulatory Visit: Payer: Self-pay

## 2021-04-23 ENCOUNTER — Ambulatory Visit
Admission: RE | Admit: 2021-04-23 | Discharge: 2021-04-23 | Disposition: A | Discharge status: Home / Self Care | Payer: Medicare Other | Source: Ambulatory Visit | Attending: Radiation Oncology | Admitting: Radiation Oncology

## 2021-04-23 DIAGNOSIS — Z48811 Encounter for surgical aftercare following surgery on the nervous system: Secondary | ICD-10-CM | POA: Diagnosis not present

## 2021-04-23 DIAGNOSIS — C712 Malignant neoplasm of temporal lobe: Secondary | ICD-10-CM | POA: Diagnosis not present

## 2021-04-23 DIAGNOSIS — Z510 Encounter for antineoplastic radiation therapy: Secondary | ICD-10-CM | POA: Diagnosis not present

## 2021-04-23 DIAGNOSIS — G939 Disorder of brain, unspecified: Secondary | ICD-10-CM | POA: Diagnosis not present

## 2021-04-23 DIAGNOSIS — R7881 Bacteremia: Secondary | ICD-10-CM | POA: Diagnosis not present

## 2021-04-23 DIAGNOSIS — G40909 Epilepsy, unspecified, not intractable, without status epilepticus: Secondary | ICD-10-CM | POA: Diagnosis not present

## 2021-04-23 DIAGNOSIS — B9689 Other specified bacterial agents as the cause of diseases classified elsewhere: Secondary | ICD-10-CM | POA: Diagnosis not present

## 2021-04-23 DIAGNOSIS — C718 Malignant neoplasm of overlapping sites of brain: Secondary | ICD-10-CM | POA: Diagnosis not present

## 2021-04-23 DIAGNOSIS — G060 Intracranial abscess and granuloma: Secondary | ICD-10-CM | POA: Diagnosis not present

## 2021-04-23 NOTE — Telephone Encounter (Signed)
Scheduled appts per 8/22 sch msg. Pt's husband is aware.

## 2021-04-24 ENCOUNTER — Ambulatory Visit
Admission: RE | Admit: 2021-04-24 | Discharge: 2021-04-24 | Disposition: A | Discharge status: Home / Self Care | Payer: Medicare Other | Source: Ambulatory Visit | Attending: Radiation Oncology | Admitting: Radiation Oncology

## 2021-04-24 ENCOUNTER — Ambulatory Visit: Payer: Medicare Other | Admitting: Radiation Oncology

## 2021-04-24 DIAGNOSIS — Z510 Encounter for antineoplastic radiation therapy: Secondary | ICD-10-CM | POA: Diagnosis not present

## 2021-04-24 DIAGNOSIS — Z48811 Encounter for surgical aftercare following surgery on the nervous system: Secondary | ICD-10-CM | POA: Diagnosis not present

## 2021-04-24 DIAGNOSIS — R7881 Bacteremia: Secondary | ICD-10-CM | POA: Diagnosis not present

## 2021-04-24 DIAGNOSIS — C712 Malignant neoplasm of temporal lobe: Secondary | ICD-10-CM | POA: Diagnosis not present

## 2021-04-24 DIAGNOSIS — G40909 Epilepsy, unspecified, not intractable, without status epilepticus: Secondary | ICD-10-CM | POA: Diagnosis not present

## 2021-04-24 DIAGNOSIS — C718 Malignant neoplasm of overlapping sites of brain: Secondary | ICD-10-CM | POA: Diagnosis not present

## 2021-04-24 DIAGNOSIS — G060 Intracranial abscess and granuloma: Secondary | ICD-10-CM | POA: Diagnosis not present

## 2021-04-24 DIAGNOSIS — B9689 Other specified bacterial agents as the cause of diseases classified elsewhere: Secondary | ICD-10-CM | POA: Diagnosis not present

## 2021-04-24 DIAGNOSIS — G939 Disorder of brain, unspecified: Secondary | ICD-10-CM | POA: Diagnosis not present

## 2021-04-25 ENCOUNTER — Ambulatory Visit
Admission: RE | Admit: 2021-04-25 | Discharge: 2021-04-25 | Disposition: A | Discharge status: Home / Self Care | Payer: Medicare Other | Source: Ambulatory Visit | Attending: Radiation Oncology | Admitting: Radiation Oncology

## 2021-04-25 ENCOUNTER — Other Ambulatory Visit: Payer: Self-pay

## 2021-04-25 DIAGNOSIS — C712 Malignant neoplasm of temporal lobe: Secondary | ICD-10-CM | POA: Diagnosis not present

## 2021-04-25 DIAGNOSIS — Z510 Encounter for antineoplastic radiation therapy: Secondary | ICD-10-CM | POA: Diagnosis not present

## 2021-04-25 DIAGNOSIS — C718 Malignant neoplasm of overlapping sites of brain: Secondary | ICD-10-CM | POA: Diagnosis not present

## 2021-04-26 ENCOUNTER — Ambulatory Visit
Admission: RE | Admit: 2021-04-26 | Discharge: 2021-04-26 | Disposition: A | Discharge status: Home / Self Care | Payer: Medicare Other | Source: Ambulatory Visit | Attending: Radiation Oncology | Admitting: Radiation Oncology

## 2021-04-26 DIAGNOSIS — C712 Malignant neoplasm of temporal lobe: Secondary | ICD-10-CM | POA: Diagnosis not present

## 2021-04-26 DIAGNOSIS — C718 Malignant neoplasm of overlapping sites of brain: Secondary | ICD-10-CM | POA: Diagnosis not present

## 2021-04-26 DIAGNOSIS — Z510 Encounter for antineoplastic radiation therapy: Secondary | ICD-10-CM | POA: Diagnosis not present

## 2021-04-26 LAB — ACID FAST CULTURE WITH REFLEXED SENSITIVITIES (MYCOBACTERIA): Acid Fast Culture: NEGATIVE

## 2021-04-27 ENCOUNTER — Ambulatory Visit
Admission: RE | Admit: 2021-04-27 | Discharge: 2021-04-27 | Disposition: A | Discharge status: Home / Self Care | Payer: Medicare Other | Source: Ambulatory Visit | Attending: Radiation Oncology | Admitting: Radiation Oncology

## 2021-04-27 ENCOUNTER — Other Ambulatory Visit: Payer: Self-pay

## 2021-04-27 DIAGNOSIS — C712 Malignant neoplasm of temporal lobe: Secondary | ICD-10-CM | POA: Diagnosis not present

## 2021-04-27 DIAGNOSIS — Z510 Encounter for antineoplastic radiation therapy: Secondary | ICD-10-CM | POA: Diagnosis not present

## 2021-04-27 DIAGNOSIS — C718 Malignant neoplasm of overlapping sites of brain: Secondary | ICD-10-CM | POA: Diagnosis not present

## 2021-04-30 ENCOUNTER — Other Ambulatory Visit: Payer: Self-pay

## 2021-04-30 ENCOUNTER — Ambulatory Visit
Admission: RE | Admit: 2021-04-30 | Discharge: 2021-04-30 | Disposition: A | Discharge status: Home / Self Care | Payer: Medicare Other | Source: Ambulatory Visit | Attending: Radiation Oncology | Admitting: Radiation Oncology

## 2021-04-30 DIAGNOSIS — Z510 Encounter for antineoplastic radiation therapy: Secondary | ICD-10-CM | POA: Diagnosis not present

## 2021-04-30 DIAGNOSIS — C712 Malignant neoplasm of temporal lobe: Secondary | ICD-10-CM | POA: Diagnosis not present

## 2021-04-30 DIAGNOSIS — C718 Malignant neoplasm of overlapping sites of brain: Secondary | ICD-10-CM | POA: Diagnosis not present

## 2021-05-01 ENCOUNTER — Inpatient Hospital Stay (HOSPITAL_BASED_OUTPATIENT_CLINIC_OR_DEPARTMENT_OTHER): Payer: Medicare Other | Admitting: Internal Medicine

## 2021-05-01 ENCOUNTER — Inpatient Hospital Stay: Payer: Medicare Other | Attending: Neurological Surgery

## 2021-05-01 ENCOUNTER — Ambulatory Visit
Admission: RE | Admit: 2021-05-01 | Discharge: 2021-05-01 | Disposition: A | Discharge status: Home / Self Care | Payer: Medicare Other | Source: Ambulatory Visit | Attending: Radiation Oncology | Admitting: Radiation Oncology

## 2021-05-01 VITALS — BP 128/71 | HR 83 | Temp 98.0°F | Resp 18 | Wt 142.1 lb

## 2021-05-01 DIAGNOSIS — Z510 Encounter for antineoplastic radiation therapy: Secondary | ICD-10-CM | POA: Diagnosis not present

## 2021-05-01 DIAGNOSIS — C719 Malignant neoplasm of brain, unspecified: Secondary | ICD-10-CM | POA: Diagnosis not present

## 2021-05-01 DIAGNOSIS — C718 Malignant neoplasm of overlapping sites of brain: Secondary | ICD-10-CM | POA: Diagnosis not present

## 2021-05-01 DIAGNOSIS — C712 Malignant neoplasm of temporal lobe: Secondary | ICD-10-CM | POA: Diagnosis not present

## 2021-05-01 DIAGNOSIS — R569 Unspecified convulsions: Secondary | ICD-10-CM

## 2021-05-01 DIAGNOSIS — F445 Conversion disorder with seizures or convulsions: Secondary | ICD-10-CM | POA: Insufficient documentation

## 2021-05-01 LAB — CBC WITH DIFFERENTIAL (CANCER CENTER ONLY)
Abs Immature Granulocytes: 0.04 10*3/uL (ref 0.00–0.07)
Basophils Absolute: 0.1 10*3/uL (ref 0.0–0.1)
Basophils Relative: 1 %
Eosinophils Absolute: 0.2 10*3/uL (ref 0.0–0.5)
Eosinophils Relative: 2 %
HCT: 37.6 % (ref 36.0–46.0)
Hemoglobin: 12.5 g/dL (ref 12.0–15.0)
Immature Granulocytes: 1 %
Lymphocytes Relative: 18 %
Lymphs Abs: 1.4 10*3/uL (ref 0.7–4.0)
MCH: 31.6 pg (ref 26.0–34.0)
MCHC: 33.2 g/dL (ref 30.0–36.0)
MCV: 95.2 fL (ref 80.0–100.0)
Monocytes Absolute: 0.5 10*3/uL (ref 0.1–1.0)
Monocytes Relative: 7 %
Neutro Abs: 5.7 10*3/uL (ref 1.7–7.7)
Neutrophils Relative %: 71 %
Platelet Count: 287 10*3/uL (ref 150–400)
RBC: 3.95 MIL/uL (ref 3.87–5.11)
RDW: 12.6 % (ref 11.5–15.5)
WBC Count: 7.9 10*3/uL (ref 4.0–10.5)
nRBC: 0 % (ref 0.0–0.2)

## 2021-05-01 LAB — CMP (CANCER CENTER ONLY)
ALT: 19 U/L (ref 0–44)
AST: 20 U/L (ref 15–41)
Albumin: 3.5 g/dL (ref 3.5–5.0)
Alkaline Phosphatase: 78 U/L (ref 38–126)
Anion gap: 8 (ref 5–15)
BUN: 12 mg/dL (ref 8–23)
CO2: 29 mmol/L (ref 22–32)
Calcium: 9.7 mg/dL (ref 8.9–10.3)
Chloride: 104 mmol/L (ref 98–111)
Creatinine: 0.84 mg/dL (ref 0.44–1.00)
GFR, Estimated: >60 mL/min (ref >60)
Glucose, Bld: 122 mg/dL — ABNORMAL HIGH (ref 70–99)
Potassium: 3.5 mmol/L (ref 3.5–5.1)
Sodium: 141 mmol/L (ref 135–145)
Total Bilirubin: 0.4 mg/dL (ref 0.3–1.2)
Total Protein: 6.8 g/dL (ref 6.5–8.1)
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
## 2021-05-02 ENCOUNTER — Other Ambulatory Visit: Payer: Self-pay

## 2021-05-02 ENCOUNTER — Ambulatory Visit
Admission: RE | Admit: 2021-05-02 | Discharge: 2021-05-02 | Disposition: A | Discharge status: Home / Self Care | Payer: Medicare Other | Source: Ambulatory Visit | Attending: Radiation Oncology | Admitting: Radiation Oncology

## 2021-05-02 DIAGNOSIS — C712 Malignant neoplasm of temporal lobe: Secondary | ICD-10-CM | POA: Diagnosis not present

## 2021-05-02 DIAGNOSIS — Z510 Encounter for antineoplastic radiation therapy: Secondary | ICD-10-CM | POA: Diagnosis not present

## 2021-05-02 DIAGNOSIS — B9689 Other specified bacterial agents as the cause of diseases classified elsewhere: Secondary | ICD-10-CM | POA: Diagnosis not present

## 2021-05-02 DIAGNOSIS — G939 Disorder of brain, unspecified: Secondary | ICD-10-CM | POA: Diagnosis not present

## 2021-05-02 DIAGNOSIS — R7881 Bacteremia: Secondary | ICD-10-CM | POA: Diagnosis not present

## 2021-05-02 DIAGNOSIS — G40909 Epilepsy, unspecified, not intractable, without status epilepticus: Secondary | ICD-10-CM | POA: Diagnosis not present

## 2021-05-02 DIAGNOSIS — Z48811 Encounter for surgical aftercare following surgery on the nervous system: Secondary | ICD-10-CM | POA: Diagnosis not present

## 2021-05-02 DIAGNOSIS — G060 Intracranial abscess and granuloma: Secondary | ICD-10-CM | POA: Diagnosis not present

## 2021-05-02 DIAGNOSIS — C718 Malignant neoplasm of overlapping sites of brain: Secondary | ICD-10-CM | POA: Diagnosis not present

## 2021-05-03 ENCOUNTER — Other Ambulatory Visit (HOSPITAL_COMMUNITY): Payer: Self-pay

## 2021-05-03 ENCOUNTER — Ambulatory Visit
Admission: RE | Admit: 2021-05-03 | Discharge: 2021-05-03 | Disposition: A | Discharge status: Home / Self Care | Payer: Medicare Other | Source: Ambulatory Visit | Attending: Radiation Oncology | Admitting: Radiation Oncology

## 2021-05-03 DIAGNOSIS — G939 Disorder of brain, unspecified: Secondary | ICD-10-CM | POA: Diagnosis not present

## 2021-05-03 DIAGNOSIS — Z48811 Encounter for surgical aftercare following surgery on the nervous system: Secondary | ICD-10-CM | POA: Diagnosis not present

## 2021-05-03 DIAGNOSIS — R7881 Bacteremia: Secondary | ICD-10-CM | POA: Diagnosis not present

## 2021-05-03 DIAGNOSIS — B9689 Other specified bacterial agents as the cause of diseases classified elsewhere: Secondary | ICD-10-CM | POA: Diagnosis not present

## 2021-05-03 DIAGNOSIS — C712 Malignant neoplasm of temporal lobe: Secondary | ICD-10-CM | POA: Insufficient documentation

## 2021-05-03 DIAGNOSIS — G40909 Epilepsy, unspecified, not intractable, without status epilepticus: Secondary | ICD-10-CM | POA: Diagnosis not present

## 2021-05-03 DIAGNOSIS — G060 Intracranial abscess and granuloma: Secondary | ICD-10-CM | POA: Diagnosis not present

## 2021-05-04 ENCOUNTER — Ambulatory Visit
Admission: RE | Admit: 2021-05-04 | Discharge: 2021-05-04 | Disposition: A | Discharge status: Home / Self Care | Payer: Medicare Other | Source: Ambulatory Visit | Attending: Radiation Oncology | Admitting: Radiation Oncology

## 2021-05-04 ENCOUNTER — Other Ambulatory Visit: Payer: Self-pay

## 2021-05-04 DIAGNOSIS — C712 Malignant neoplasm of temporal lobe: Secondary | ICD-10-CM | POA: Diagnosis not present

## 2021-05-08 ENCOUNTER — Ambulatory Visit
Admission: RE | Admit: 2021-05-08 | Discharge: 2021-05-08 | Disposition: A | Discharge status: Home / Self Care | Payer: Medicare Other | Source: Ambulatory Visit | Attending: Radiation Oncology | Admitting: Radiation Oncology

## 2021-05-08 ENCOUNTER — Other Ambulatory Visit: Payer: Self-pay

## 2021-05-08 DIAGNOSIS — C712 Malignant neoplasm of temporal lobe: Secondary | ICD-10-CM | POA: Diagnosis not present

## 2021-05-09 ENCOUNTER — Ambulatory Visit
Admission: RE | Admit: 2021-05-09 | Discharge: 2021-05-09 | Disposition: A | Discharge status: Home / Self Care | Payer: Medicare Other | Source: Ambulatory Visit | Attending: Radiation Oncology | Admitting: Radiation Oncology

## 2021-05-09 DIAGNOSIS — C712 Malignant neoplasm of temporal lobe: Secondary | ICD-10-CM | POA: Diagnosis not present

## 2021-05-10 ENCOUNTER — Other Ambulatory Visit: Payer: Self-pay

## 2021-05-10 ENCOUNTER — Ambulatory Visit
Admission: RE | Admit: 2021-05-10 | Discharge: 2021-05-10 | Disposition: A | Discharge status: Home / Self Care | Payer: Medicare Other | Source: Ambulatory Visit | Attending: Radiation Oncology | Admitting: Radiation Oncology

## 2021-05-10 DIAGNOSIS — C712 Malignant neoplasm of temporal lobe: Secondary | ICD-10-CM | POA: Diagnosis not present

## 2021-05-11 ENCOUNTER — Ambulatory Visit
Admission: RE | Admit: 2021-05-11 | Discharge: 2021-05-11 | Disposition: A | Discharge status: Home / Self Care | Payer: Medicare Other | Source: Ambulatory Visit | Attending: Radiation Oncology | Admitting: Radiation Oncology

## 2021-05-11 DIAGNOSIS — C712 Malignant neoplasm of temporal lobe: Secondary | ICD-10-CM | POA: Diagnosis not present

## 2021-05-14 ENCOUNTER — Other Ambulatory Visit: Payer: Self-pay

## 2021-05-14 ENCOUNTER — Ambulatory Visit
Admission: RE | Admit: 2021-05-14 | Discharge: 2021-05-14 | Disposition: A | Discharge status: Home / Self Care | Payer: Medicare Other | Source: Ambulatory Visit | Attending: Radiation Oncology | Admitting: Radiation Oncology

## 2021-05-14 ENCOUNTER — Ambulatory Visit: Payer: Medicare Other

## 2021-05-14 ENCOUNTER — Encounter: Payer: Self-pay | Admitting: Radiation Oncology

## 2021-05-14 DIAGNOSIS — C712 Malignant neoplasm of temporal lobe: Secondary | ICD-10-CM | POA: Diagnosis not present

## 2021-05-14 MED ORDER — SONAFINE EX EMUL
1.0000 "application " | Freq: Two times a day (BID) | CUTANEOUS | Status: DC
  Administered 2021-05-14: 1 via TOPICAL

## 2021-05-15 ENCOUNTER — Ambulatory Visit: Payer: Medicare Other

## 2021-05-15 ENCOUNTER — Other Ambulatory Visit: Payer: Self-pay | Admitting: Radiation Therapy

## 2021-05-16 ENCOUNTER — Ambulatory Visit: Payer: Medicare Other

## 2021-05-17 ENCOUNTER — Ambulatory Visit: Payer: Medicare Other

## 2021-05-18 ENCOUNTER — Ambulatory Visit: Payer: Medicare Other

## 2021-05-21 ENCOUNTER — Ambulatory Visit: Payer: Medicare Other

## 2021-05-22 ENCOUNTER — Ambulatory Visit: Payer: Medicare Other

## 2021-05-22 ENCOUNTER — Telehealth: Payer: Self-pay | Admitting: Internal Medicine

## 2021-05-22 NOTE — Telephone Encounter (Signed)
Sch per 8/30 los, pt aware

## 2021-05-23 ENCOUNTER — Ambulatory Visit: Payer: Medicare Other

## 2021-05-24 ENCOUNTER — Ambulatory Visit: Payer: Medicare Other

## 2021-05-25 ENCOUNTER — Ambulatory Visit: Payer: Medicare Other

## 2021-05-28 ENCOUNTER — Ambulatory Visit: Payer: Medicare Other

## 2021-05-29 ENCOUNTER — Ambulatory Visit: Payer: Medicare Other

## 2021-05-30 ENCOUNTER — Ambulatory Visit: Payer: Medicare Other

## 2021-05-31 ENCOUNTER — Ambulatory Visit: Payer: Medicare Other

## 2021-06-01 ENCOUNTER — Ambulatory Visit: Payer: Medicare Other

## 2021-06-02 DIAGNOSIS — Z23 Encounter for immunization: Secondary | ICD-10-CM | POA: Diagnosis not present

## 2021-06-04 ENCOUNTER — Ambulatory Visit: Payer: Medicare Other

## 2021-06-05 ENCOUNTER — Ambulatory Visit: Payer: Medicare Other

## 2021-06-08 ENCOUNTER — Other Ambulatory Visit: Payer: Self-pay

## 2021-06-08 ENCOUNTER — Ambulatory Visit (HOSPITAL_COMMUNITY)
Admission: RE | Admit: 2021-06-08 | Discharge: 2021-06-08 | Disposition: A | Discharge status: Home / Self Care | Payer: Medicare Other | Source: Ambulatory Visit | Attending: Internal Medicine | Admitting: Internal Medicine

## 2021-06-08 DIAGNOSIS — G9389 Other specified disorders of brain: Secondary | ICD-10-CM | POA: Diagnosis not present

## 2021-06-08 DIAGNOSIS — C719 Malignant neoplasm of brain, unspecified: Secondary | ICD-10-CM | POA: Diagnosis not present

## 2021-06-08 IMAGING — MR MR HEAD WO/W CM
13 series · 48 of 48 positions shown · IV contrast (gadavist)
Comparison: [DATE]

CLINICAL DATA: Brain/CNS neoplasm, assess treatment response.
Multifocal high-grade glioma.

EXAM:
MRI HEAD WITHOUT AND WITH CONTRAST
TECHNIQUE: Multiplanar, multiecho pulse sequences of the brain and surrounding
structures were obtained without and with intravenous contrast.
CONTRAST:  7mL GADAVIST GADOBUTROL 1 MMOL/ML IV SOLN

[Series 5: DWI · axial · 3.0mm · 1.36mm/px · z∈[-70,+71]mm · 5 of 96 slices shown (1 of 2)]
[im 1/96]
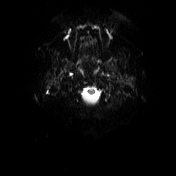
[im 24/96]
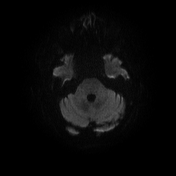
[im 48/96]
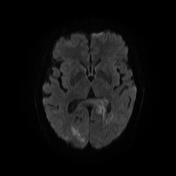
[im 72/96]
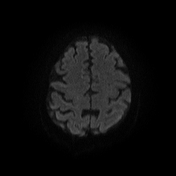
[im 96/96]
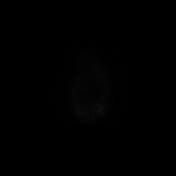

[Series 6: DWI · axial · 3.0mm · 1.36mm/px · z∈[-70,+71]mm · 2 of 48 slices shown (2 of 2)]
[im 1/48]
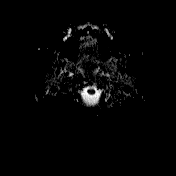
[im 48/48]
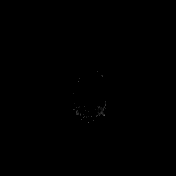

[Series 7: T1 · sagittal · 5.0mm · 0.75mm/px · 2 of 24 slices shown (1 of 2)]
[im 1/24]
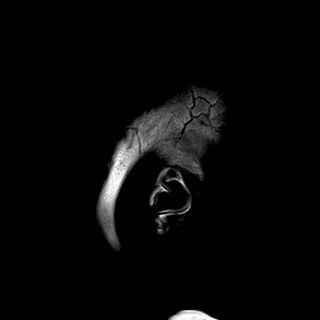
[im 24/24]
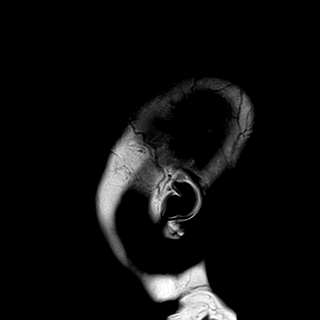

[Series 8: T2 · axial · 5.0mm · 0.62mm/px · z∈[-81,+81]mm · 2 of 26 slices shown]
[im 1/26]
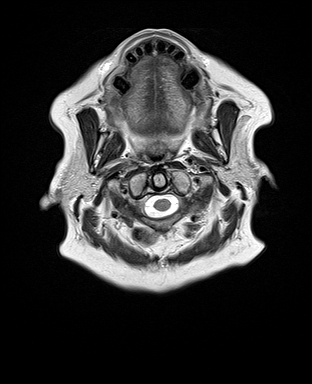
[im 26/26]
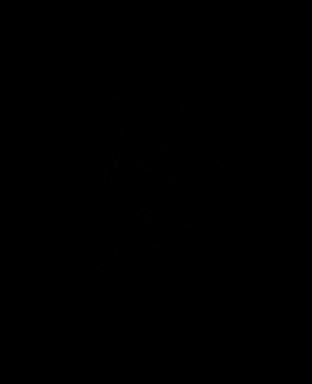

[Series 9: swi_images · axial · 3.0mm · 0.75mm/px · z∈[-107,+106]mm · 5 of 72 slices shown]
[im 1/72]
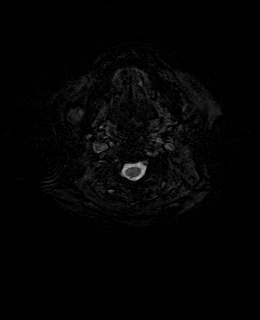
[im 18/72]
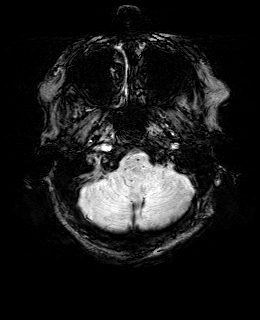
[im 36/72]
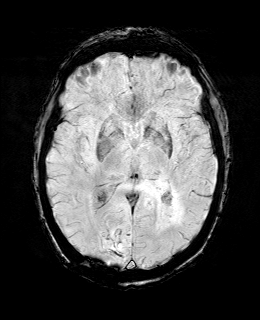
[im 54/72]
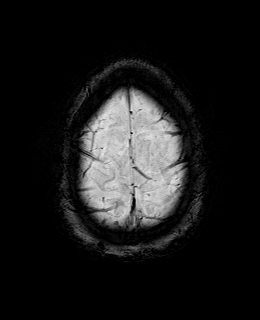
[im 72/72]
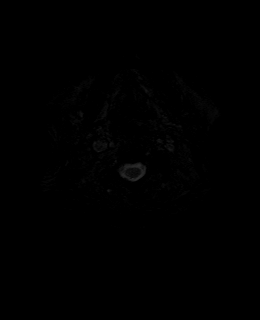

[Series 11: FLAIR · axial · 3.0mm · 0.75mm/px · z∈[-77,+76]mm · 3 of 52 slices shown]
[im 1/52]
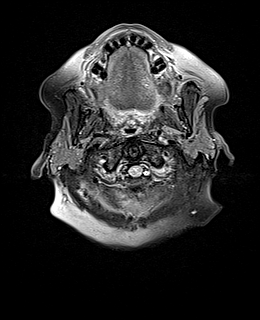
[im 26/52]
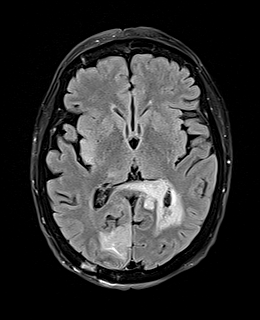
[im 52/52]
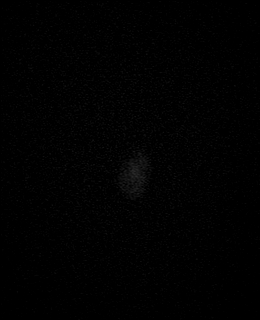

[Series 12: T1 · axial · 1.0mm · 0.94mm/px · z∈[-72,+71]mm · 9 of 144 slices shown (2 of 2)]
[im 1/144]
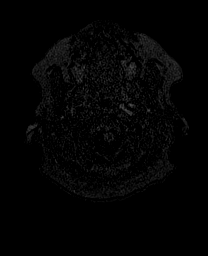
[im 18/144]
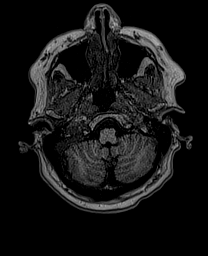
[im 36/144]
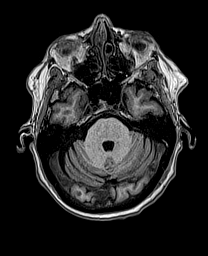
[im 54/144]
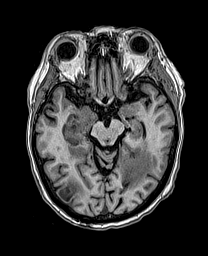
[im 72/144]
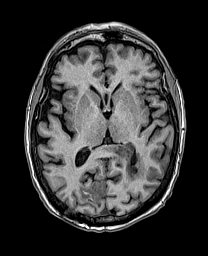
[im 90/144]
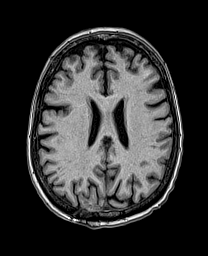
[im 108/144]
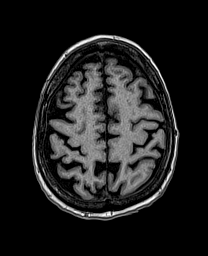
[im 126/144]
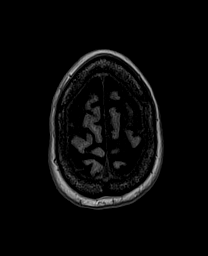
[im 144/144]
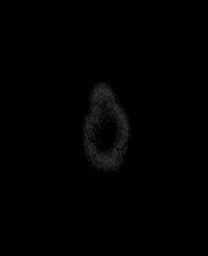

[Series 13: cor dwi_tracew · coronal · 5.0mm · 1.53mm/px · 3 of 48 slices shown]
[im 1/48]
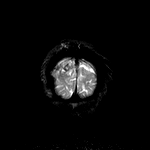
[im 24/48]
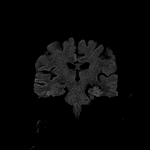
[im 48/48]
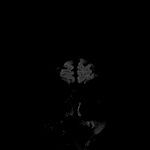

[Series 14: cor dwi_adc · coronal · 5.0mm · 1.53mm/px · 2 of 24 slices shown]
[im 1/24]
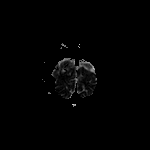
[im 24/24]
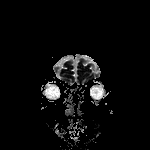

[Series 15: T2 post-contrast · coronal · 5.0mm · 0.57mm/px · 2 of 32 slices shown]
[im 1/32]
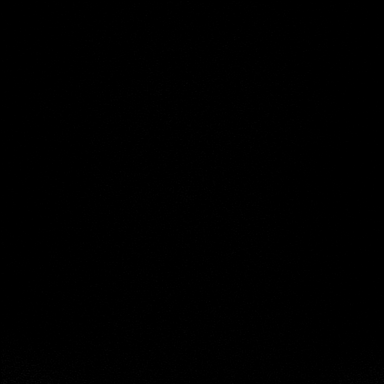
[im 32/32]
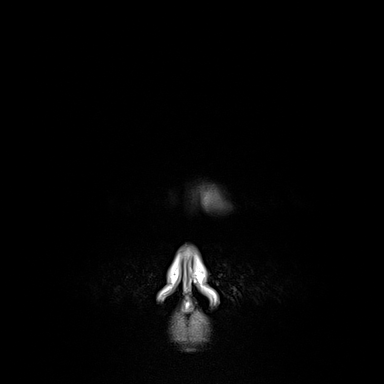

[Series 16: T1 post-contrast · axial · 1.0mm · 0.94mm/px · z∈[-72,+71]mm · 9 of 144 slices shown (1 of 3)]
[im 1/144]
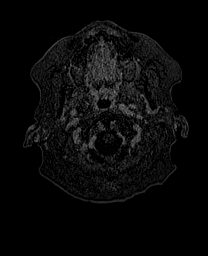
[im 18/144]
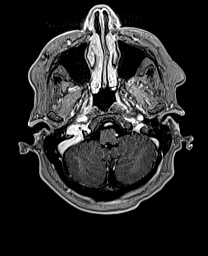
[im 36/144]
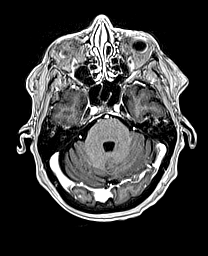
[im 54/144]
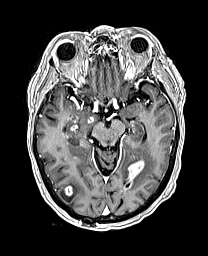
[im 72/144]
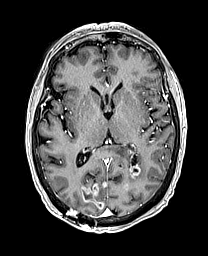
[im 90/144]
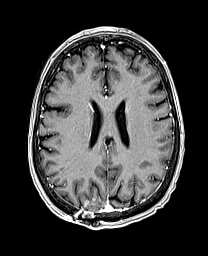
[im 108/144]
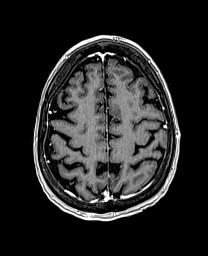
[im 126/144]
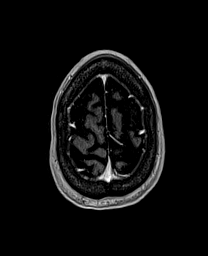
[im 144/144]
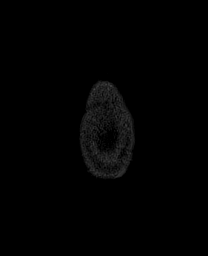

[Series 17: T1 post-contrast · coronal · 5.0mm · 0.43mm/px · 2 of 32 slices shown (2 of 3)]
[im 1/32]
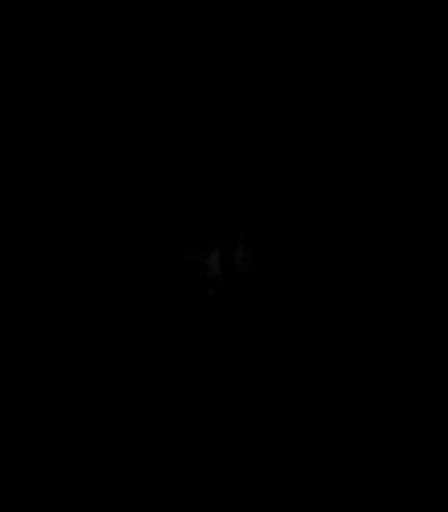
[im 32/32]
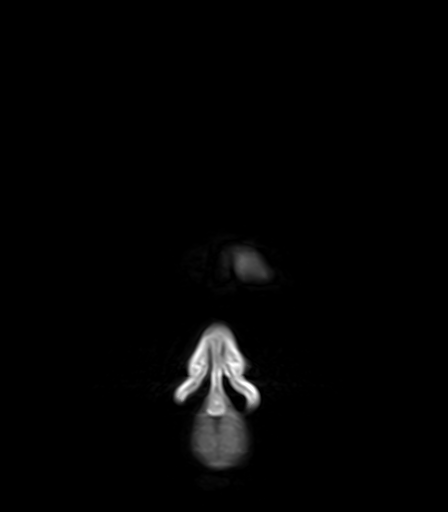

[Series 18: T1 post-contrast · sagittal · 5.0mm · 0.75mm/px · 2 of 24 slices shown (3 of 3)]
[im 1/24]
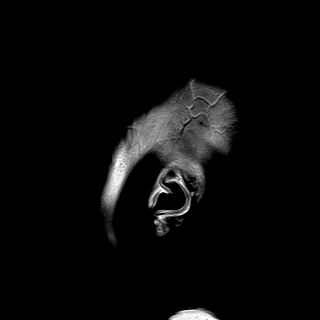
[im 24/24]
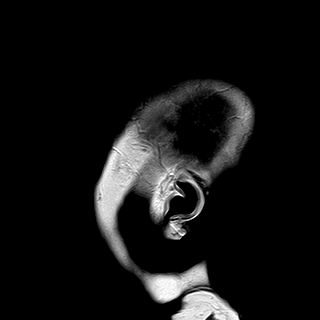

[48 of 48 positions shown; findings below may reference images not displayed]

FINDINGS: Brain: The overall volume of heterogeneously enhancing masslike
tissue in the medial aspects of the temporal and occipital lobes is
similar to the prior MRI although the degree of enhancement has in
general decreased. There is persistent ependymal enhancement
involving the atrium and occipital horn of the left lateral
ventricle and, to a lesser extent, temporal horn of the right
lateral ventricle. Nonenhancing T2 hyperintensity/edema type signal
in the occipital lobes, posterior left temporal lobe, and splenium
of the corpus callosum has mildly increased. An 8 mm focus of ring
enhancement along the anterior aspect of the splenium of the corpus
callosum/posterior third ventricle is larger (series 16, image 80).
A 1.3 cm enhancing lesion in the lateral aspect of the right
occipital lobe has enlarged (series 16, image 60).

No acute infarct, midline shift, or extra-axial fluid collection is
identified. Sequelae of interval right occipital biopsy are noted.
Chronic blood products are again noted associated with bilateral
temporal occipital tumor.

Vascular: Major intracranial vascular flow voids are preserved.

Skull and upper cervical spine: Right parieto-occipital craniotomy.

Sinuses/Orbits: Unremarkable orbits. Clear paranasal sinuses. Trace
bilateral mastoid effusions.

Other: None.
IMPRESSION: 1. Mixed interval changes as detailed above.
2. Overall decreased enhancement of bulky tumor medially in the
temporal-occipital regions bilaterally.
3. Increased size of enhancing lesions along the splenium of the
corpus callosum and laterally in the right occipital lobe.
4. Mildly increased edema type signal bilaterally.

## 2021-06-08 MED ORDER — GADOBUTROL 1 MMOL/ML IV SOLN
7.0000 mL | Freq: Once | INTRAVENOUS | Status: AC | PRN
  Administered 2021-06-08: 7 mL via INTRAVENOUS

## 2021-06-11 ENCOUNTER — Inpatient Hospital Stay: Payer: Medicare Other | Attending: Neurological Surgery

## 2021-06-11 DIAGNOSIS — F445 Conversion disorder with seizures or convulsions: Secondary | ICD-10-CM | POA: Insufficient documentation

## 2021-06-11 DIAGNOSIS — C718 Malignant neoplasm of overlapping sites of brain: Secondary | ICD-10-CM | POA: Insufficient documentation

## 2021-06-12 ENCOUNTER — Other Ambulatory Visit (HOSPITAL_COMMUNITY): Payer: Self-pay

## 2021-06-12 ENCOUNTER — Inpatient Hospital Stay (HOSPITAL_BASED_OUTPATIENT_CLINIC_OR_DEPARTMENT_OTHER): Payer: Medicare Other | Admitting: Internal Medicine

## 2021-06-12 ENCOUNTER — Encounter: Payer: Self-pay | Admitting: Internal Medicine

## 2021-06-12 ENCOUNTER — Other Ambulatory Visit: Payer: Self-pay

## 2021-06-12 ENCOUNTER — Inpatient Hospital Stay: Payer: Medicare Other

## 2021-06-12 ENCOUNTER — Telehealth: Payer: Self-pay | Admitting: Pharmacist

## 2021-06-12 VITALS — BP 108/64 | HR 80 | Temp 97.8°F | Resp 18 | Wt 137.0 lb

## 2021-06-12 DIAGNOSIS — R569 Unspecified convulsions: Secondary | ICD-10-CM

## 2021-06-12 DIAGNOSIS — C719 Malignant neoplasm of brain, unspecified: Secondary | ICD-10-CM | POA: Diagnosis not present

## 2021-06-12 DIAGNOSIS — C718 Malignant neoplasm of overlapping sites of brain: Secondary | ICD-10-CM | POA: Diagnosis not present

## 2021-06-12 DIAGNOSIS — F445 Conversion disorder with seizures or convulsions: Secondary | ICD-10-CM | POA: Diagnosis not present

## 2021-06-12 LAB — CBC WITH DIFFERENTIAL (CANCER CENTER ONLY)
Abs Immature Granulocytes: 0.01 10*3/uL (ref 0.00–0.07)
Basophils Absolute: 0 10*3/uL (ref 0.0–0.1)
Basophils Relative: 1 %
Eosinophils Absolute: 0.2 10*3/uL (ref 0.0–0.5)
Eosinophils Relative: 3 %
HCT: 36.7 % (ref 36.0–46.0)
Hemoglobin: 12.2 g/dL (ref 12.0–15.0)
Immature Granulocytes: 0 %
Lymphocytes Relative: 23 %
Lymphs Abs: 1.4 10*3/uL (ref 0.7–4.0)
MCH: 31.4 pg (ref 26.0–34.0)
MCHC: 33.2 g/dL (ref 30.0–36.0)
MCV: 94.3 fL (ref 80.0–100.0)
Monocytes Absolute: 0.3 10*3/uL (ref 0.1–1.0)
Monocytes Relative: 5 %
Neutro Abs: 3.9 10*3/uL (ref 1.7–7.7)
Neutrophils Relative %: 68 %
Platelet Count: 263 10*3/uL (ref 150–400)
RBC: 3.89 MIL/uL (ref 3.87–5.11)
RDW: 13 % (ref 11.5–15.5)
WBC Count: 5.8 10*3/uL (ref 4.0–10.5)
nRBC: 0 % (ref 0.0–0.2)

## 2021-06-12 LAB — CMP (CANCER CENTER ONLY)
ALT: 10 U/L (ref 0–44)
AST: 17 U/L (ref 15–41)
Albumin: 3.5 g/dL (ref 3.5–5.0)
Alkaline Phosphatase: 66 U/L (ref 38–126)
Anion gap: 8 (ref 5–15)
BUN: 11 mg/dL (ref 8–23)
CO2: 27 mmol/L (ref 22–32)
Calcium: 9.6 mg/dL (ref 8.9–10.3)
Chloride: 103 mmol/L (ref 98–111)
Creatinine: 0.91 mg/dL (ref 0.44–1.00)
GFR, Estimated: >60 mL/min (ref >60)
Glucose, Bld: 126 mg/dL — ABNORMAL HIGH (ref 70–99)
Potassium: 3.6 mmol/L (ref 3.5–5.1)
Sodium: 138 mmol/L (ref 135–145)
Total Bilirubin: 0.5 mg/dL (ref 0.3–1.2)
Total Protein: 6.7 g/dL (ref 6.5–8.1)

## 2021-06-12 MED ORDER — TEMOZOLOMIDE 140 MG PO CAPS
140.0000 mg | ORAL_CAPSULE | Freq: Every day | ORAL | 0 refills | Status: DC
  Filled 2021-06-12: qty 5, 5d supply, fill #0
  Filled 2021-06-13: qty 5, 28d supply, fill #0

## 2021-06-12 MED ORDER — ONDANSETRON HCL 8 MG PO TABS
8.0000 mg | ORAL_TABLET | Freq: Two times a day (BID) | ORAL | 1 refills | Status: AC | PRN
  Filled 2021-06-12: qty 30, 15d supply, fill #0
  Filled 2021-09-06: qty 30, 15d supply, fill #1

## 2021-06-12 MED ORDER — TEMOZOLOMIDE 100 MG PO CAPS
100.0000 mg | ORAL_CAPSULE | Freq: Every day | ORAL | 0 refills | Status: DC
  Filled 2021-06-12: qty 5, 5d supply, fill #0
  Filled 2021-06-13: qty 5, 28d supply, fill #0

## 2021-06-12 NOTE — Progress Notes (Signed)
DISCONTINUE ON PATHWAY REGIMEN - Neuro     One cycle, concurrent with RT:     Temozolomide   **Always confirm dose/schedule in your pharmacy ordering system**  REASON: Continuation Of Treatment PRIOR TREATMENT: [id number]: Radiation Therapy with Concurrent Temozolomide 75 mg/m2 Daily x 3 Weeks, Followed by Adjuvant Temozolomide TREATMENT RESPONSE: Stable Disease (SD)  START ON PATHWAY REGIMEN - Neuro     A cycle is every 28 days:     Temozolomide      Temozolomide   **Always confirm dose/schedule in your pharmacy ordering system**  Patient Characteristics: Glioblastoma (Grade 4 Glioma), Newly Diagnosed / Treatment Naive, Poor Performance Status and/or Elderly Patient Disease Classification: Glioma Disease Classification: Glioblastoma (Grade 4 Glioma) Disease Status: Newly Diagnosed / Treatment Naive Performance Status: Poor Performance Status and/or Elderly Patient Intent of Therapy: Non-Curative / Palliative Intent, Discussed with Patient

## 2021-06-12 NOTE — Telephone Encounter (Signed)
Oral Oncology Pharmacist Encounter  Received new prescription for Temodar (temozolomide) for the maintenance treatment of high grade glioma, planned duration 6-12 cycles.  CBC w/ Diff and CMP from 06/12/21 assessed, no relevant lab abnormalities noted. Prescription dose and frequency assessed for appropriateness. Appropriate for therapy initiation.    Current medication list in Epic reviewed, no relevant/significant DDIs with Temodar identified.  Evaluated chart and no patient barriers to medication adherence noted.   Patient agreement for treatment documented in MD note on 06/12/21.  Prescription has been e-scribed to the [facility] for benefits analysis and approval.  Oral Oncology Clinic will continue to follow for insurance authorization, copayment issues, initial counseling and start date.  Leron Croak, PharmD, BCPS Hematology/Oncology Clinical Pharmacist [facility] [place] [phone] 06/12/2021 1:51 PM
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
## 2021-06-13 ENCOUNTER — Telehealth: Payer: Self-pay | Admitting: *Deleted

## 2021-06-13 ENCOUNTER — Other Ambulatory Visit (HOSPITAL_COMMUNITY): Payer: Self-pay

## 2021-06-13 ENCOUNTER — Telehealth: Payer: Self-pay | Admitting: Internal Medicine

## 2021-06-13 NOTE — Telephone Encounter (Signed)
CALLED PATIENT TO ASK ABOUT RESCHEDULING FU ON 06-15-21, SPOKE WITH PATIENT'S [place] THAT THEY SAW DR. VASLOW YESTERDAY AND THEY DIDN'T SEE THE NEED IN COMING Friday, NOTIFIED DR. SQUIRE

## 2021-06-13 NOTE — Telephone Encounter (Signed)
Oral Chemotherapy Pharmacist Encounter  I spoke with patient's husband for overview of: Temodar (temozolomide) for the maintenance treatment of glioblastoma multiforme, planned duration 6-12 months of treatment.  Counseled on administration, dosing, side effects, monitoring, drug-food interactions, safe handling, storage, and disposal.  Patient will take Temodar 100mg  capsules and Temodar 140mg  capsules, 240mg  total daily dose, by mouth once daily, may take at bedtime and on an empty stomach to decrease nausea and vomiting.  If 1st cycle is well tolerated, patient's husband informed that Temodar dose may be increased to 200 mg/m2 daily for 5 days on, 23 days off, repeated every 28 days for subsequent cycles   Patient will take Temodar daily for 5 days on, 23 days off, and repeated.  Temodar start date: 06/14/21 PM   Patient will take Zofran 8mg  tablet, 1 tablet by mouth 30-60 min prior to Temodar dose to help decrease N/V.   Adverse effects include but are not limited to: nausea, vomiting, anorexia, GI upset, rash, drug fever, and fatigue. Rare but serious adverse effects of pneumocystis pneumonia and secondary malignancy also discussed.  We discussed strategies to manage constipation if they occur secondary to ondansetron dosing.  PCP prophylaxis will not be initiated at this time, but may be added based on lymphocyte count in the future.  Reviewed importance of keeping a medication schedule and plan for any missed doses. No barriers to medication adherence identified.  Medication reconciliation performed and medication/allergy list updated.  Insurance authorization for Temodar has been obtained. Test claim at the pharmacy revealed copayment $0 for 1st fill of Temodar. This will ship from the [facility] on 06/13/21 to deliver to patient's home on 06/14/21.  Patient informed the pharmacy will reach out 5-7 days prior to needing next fill of Temodar to coordinate  continued medication acquisition to prevent break in therapy.  All questions answered.  Mr. Beckstrom voiced understanding and appreciation.   Medication education handout placed in mail for patient. Patient and patient's husband know to call the office with questions or concerns. Oral Chemotherapy Clinic phone number provided.   Leron Croak, PharmD, BCPS Hematology/Oncology Clinical Pharmacist [facility] [phone] 06/13/2021 10:13 AM

## 2021-06-13 NOTE — Telephone Encounter (Signed)
Scheduled appt per 10/11 los. Pt's husband is aware.

## 2021-06-14 ENCOUNTER — Other Ambulatory Visit (HOSPITAL_COMMUNITY): Payer: Self-pay

## 2021-06-15 ENCOUNTER — Ambulatory Visit: Payer: Medicare Other | Admitting: Radiation Oncology

## 2021-06-19 ENCOUNTER — Ambulatory Visit: Payer: Medicare Other | Attending: Internal Medicine

## 2021-06-19 DIAGNOSIS — Z23 Encounter for immunization: Secondary | ICD-10-CM

## 2021-06-19 NOTE — Progress Notes (Signed)
   Covid-19 Vaccination Clinic  Name:  Allicia Culley    MRN: [id number] DOB: 1945-10-13  06/19/2021  Ms. Kovich was observed post Covid-19 immunization for 15 minutes without incident. She was provided with Vaccine Information Sheet and instruction to access the V-Safe system.   Ms. Riches was instructed to call 911 with any severe reactions post vaccine: Difficulty breathing  Swelling of face and throat  A fast heartbeat  A bad rash all over body  Dizziness and weakness

## 2021-07-03 ENCOUNTER — Encounter: Payer: Self-pay | Admitting: Internal Medicine

## 2021-07-03 NOTE — Progress Notes (Signed)
                                                                                                                                                             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  Cancer Staging High grade glioma not classifiable by WHO criteria [facility]) Staging form: Brain and Spinal Cord, AJCC 8th Edition - Pathologic: WHO Grade IV - Signed by Ventura Sellers, MD on 03/29/2021 Histologic grading system: 4 grade system Extent of surgical resection: Biopsy Solitary (s) or multifocal (m) tumors in the primary site: Multifocal Karnofsky performance status: Score 70 Seizures at presentation: Present Duration of symptoms before diagnosis: Short IDH1 mutation: Negative   Intent: Palliative  Radiation Treatment Dates: 04/23/2021 through 05/14/2021 Site Technique Total Dose (Gy) Dose per Fx (Gy) Completed Fx Beam Energies  Brain: Brain_partial IMRT 40.05/40.05 2.67 15/15 6X   Narrative: The patient tolerated radiation therapy relatively well.   Plan: The patient is scheduled to follow-up with radiation oncology in 65mo.  -----------------------------------  Eppie Gibson, MD

## 2021-07-05 ENCOUNTER — Other Ambulatory Visit (HOSPITAL_COMMUNITY): Payer: Self-pay

## 2021-07-05 ENCOUNTER — Other Ambulatory Visit: Payer: Self-pay | Admitting: Internal Medicine

## 2021-07-05 DIAGNOSIS — C719 Malignant neoplasm of brain, unspecified: Secondary | ICD-10-CM

## 2021-07-05 NOTE — Telephone Encounter (Signed)
Patient has appointment scheduled to have labs and md visit prior to prescription being reordered.

## 2021-07-06 ENCOUNTER — Other Ambulatory Visit (HOSPITAL_COMMUNITY): Payer: Self-pay

## 2021-07-11 ENCOUNTER — Other Ambulatory Visit (HOSPITAL_COMMUNITY): Payer: Self-pay

## 2021-07-11 ENCOUNTER — Other Ambulatory Visit: Payer: Self-pay

## 2021-07-11 DIAGNOSIS — C719 Malignant neoplasm of brain, unspecified: Secondary | ICD-10-CM

## 2021-07-12 ENCOUNTER — Other Ambulatory Visit (HOSPITAL_COMMUNITY): Payer: Self-pay

## 2021-07-16 ENCOUNTER — Other Ambulatory Visit: Payer: Self-pay

## 2021-07-16 ENCOUNTER — Inpatient Hospital Stay: Payer: Medicare Other | Attending: Neurological Surgery | Admitting: Internal Medicine

## 2021-07-16 ENCOUNTER — Telehealth: Payer: Self-pay | Admitting: Internal Medicine

## 2021-07-16 ENCOUNTER — Inpatient Hospital Stay: Payer: Medicare Other

## 2021-07-16 ENCOUNTER — Other Ambulatory Visit (HOSPITAL_COMMUNITY): Payer: Self-pay

## 2021-07-16 VITALS — BP 133/72 | HR 85 | Temp 97.7°F | Resp 17 | Wt 135.1 lb

## 2021-07-16 DIAGNOSIS — C718 Malignant neoplasm of overlapping sites of brain: Secondary | ICD-10-CM | POA: Insufficient documentation

## 2021-07-16 DIAGNOSIS — C719 Malignant neoplasm of brain, unspecified: Secondary | ICD-10-CM

## 2021-07-16 DIAGNOSIS — R569 Unspecified convulsions: Secondary | ICD-10-CM

## 2021-07-16 DIAGNOSIS — F445 Conversion disorder with seizures or convulsions: Secondary | ICD-10-CM | POA: Insufficient documentation

## 2021-07-16 LAB — CBC WITH DIFFERENTIAL (CANCER CENTER ONLY)
Abs Immature Granulocytes: 0.01 10*3/uL (ref 0.00–0.07)
Basophils Absolute: 0 10*3/uL (ref 0.0–0.1)
Basophils Relative: 1 %
Eosinophils Absolute: 0.1 10*3/uL (ref 0.0–0.5)
Eosinophils Relative: 2 %
HCT: 35.9 % — ABNORMAL LOW (ref 36.0–46.0)
Hemoglobin: 12.2 g/dL (ref 12.0–15.0)
Immature Granulocytes: 0 %
Lymphocytes Relative: 20 %
Lymphs Abs: 1.2 10*3/uL (ref 0.7–4.0)
MCH: 31.9 pg (ref 26.0–34.0)
MCHC: 34 g/dL (ref 30.0–36.0)
MCV: 93.7 fL (ref 80.0–100.0)
Monocytes Absolute: 0.3 10*3/uL (ref 0.1–1.0)
Monocytes Relative: 6 %
Neutro Abs: 4 10*3/uL (ref 1.7–7.7)
Neutrophils Relative %: 71 %
Platelet Count: 262 10*3/uL (ref 150–400)
RBC: 3.83 MIL/uL — ABNORMAL LOW (ref 3.87–5.11)
RDW: 13.8 % (ref 11.5–15.5)
WBC Count: 5.6 10*3/uL (ref 4.0–10.5)
nRBC: 0 % (ref 0.0–0.2)

## 2021-07-16 LAB — CMP (CANCER CENTER ONLY)
ALT: 8 U/L (ref 0–44)
AST: 17 U/L (ref 15–41)
Albumin: 3.5 g/dL (ref 3.5–5.0)
Alkaline Phosphatase: 77 U/L (ref 38–126)
Anion gap: 9 (ref 5–15)
BUN: 15 mg/dL (ref 8–23)
CO2: 26 mmol/L (ref 22–32)
Calcium: 9.3 mg/dL (ref 8.9–10.3)
Chloride: 105 mmol/L (ref 98–111)
Creatinine: 0.95 mg/dL (ref 0.44–1.00)
GFR, Estimated: >60 mL/min (ref >60)
Glucose, Bld: 129 mg/dL — ABNORMAL HIGH (ref 70–99)
Potassium: 3.5 mmol/L (ref 3.5–5.1)
Sodium: 140 mmol/L (ref 135–145)
Total Bilirubin: 0.5 mg/dL (ref 0.3–1.2)
Total Protein: 6.9 g/dL (ref 6.5–8.1)

## 2021-07-16 MED ORDER — LEVETIRACETAM 1000 MG PO TABS: 1000.0000 mg | ORAL_TABLET | Freq: Two times a day (BID) | ORAL | 3 refills | Status: AC

## 2021-07-16 MED ORDER — TEMOZOLOMIDE 100 MG PO CAPS
200.0000 mg | ORAL_CAPSULE | Freq: Every day | ORAL | 0 refills | Status: DC
  Filled 2021-07-16: qty 10, 5d supply, fill #0

## 2021-07-16 MED ORDER — ALPRAZOLAM 0.25 MG PO TABS: 0.2500 mg | ORAL_TABLET | Freq: Three times a day (TID) | ORAL | 0 refills | Status: AC | PRN

## 2021-07-16 MED ORDER — TEMOZOLOMIDE 140 MG PO CAPS
140.0000 mg | ORAL_CAPSULE | Freq: Every day | ORAL | 0 refills | Status: DC
  Filled 2021-07-16: qty 5, 5d supply, fill #0

## 2021-07-16 MED ORDER — TEMOZOLOMIDE 100 MG PO CAPS
100.0000 mg | ORAL_CAPSULE | Freq: Every day | ORAL | 0 refills | Status: DC
  Filled 2021-07-16: qty 10, 10d supply, fill #0

## 2021-07-16 NOTE — Progress Notes (Signed)
[facility] [place], [place] [address] [phone]   Interval Evaluation  Date of Service: 07/16/21 Patient Name: Andrea [name] Patient MRN: [id number] Patient DOB: February 02, 1946 Provider: Ventura Sellers, MD  Identifying Statement:  Andrea [name] is a 75 y.o. 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 Reatha Armour; path is high grade glioma    03/29/2021 Cancer Staging   Staging form: Brain and Spinal Cord, AJCC 8th Edition - Pathologic: WHO Grade IV - Signed by Ventura Sellers, MD on 03/29/2021 Histologic grading system: 4 grade system Extent of surgical resection: Biopsy Solitary (s) or multifocal (m) tumors in the primary site: Multifocal Karnofsky performance status: Score 70 Seizures at presentation: Present Duration of symptoms before diagnosis: Short IDH1 mutation: Negative    04/23/2021 - 05/14/2021 Radiation Therapy   3 weeks IMRT and Temodar with Dr. Isidore Moos   06/18/2021 -  Chemotherapy   Patient is on Treatment Plan : BRAIN GLIOBLASTOMA Consolidation Temozolomide Days 1-5 q28 Days        Biomarkers:  MGMT Unknown.  IDH 1/2 Wild type.  EGFR Unknown  TERT Unknown   Interval History: Andrea [name] presents today for follow up, now having completed first cycle of 5-day Temodar.  No significant issues with chemo. She denies any new or progressive deficits today. She continues to describe difficulty with navigating spaces (homes, buildings, hallways, etc.) and relies on daughter for direction, as prior.  Otherwise continues with functional independence.  Only mild fatigue, no seizures.  H+P (03/29/21) Patient presented to medical attention earlier this month with first ever seizure.  She was driving her car, was found minimally responsive along the highway; patient is amnestic of  the event.  CNS imaging demonstrated multifocal enhancing masses; biopsy was performed on 03/13/21 by Dr. Reatha Armour, with path sent out to [facility] demonstrating IDH-1 wild type high grade glioma, otherwise not graded.  Following surgery she has not experienced further seizures.  Family describes some difficulty at home navigating from room to room, forgetting how to get to the bathroom.  Also described is impaired short term memory, forgetting conversations she had even minutes earlier. Otherwise able to walk independently, no help needed with ADLs.   Medications: Current Outpatient Medications on File Prior to Visit  Medication Sig Dispense Refill   ALPRAZolam (XANAX) 0.25 MG tablet Take 0.25 mg by mouth 3 (three) times daily as needed.     benazepril-hydrochlorthiazide (LOTENSIN HCT) 10-12.5 MG tablet Take 1 tablet by mouth 2 (two) times daily.     levETIRAcetam (KEPPRA) 1000 MG tablet Take 1 tablet (1,000 mg total) by mouth 2 (two) times daily. 60 tablet 3   ondansetron (ZOFRAN) 8 MG tablet Take 1 tablet (8 mg total) by mouth 2 (two) times daily as needed (nausea and vomiting). May take 30-60 minutes prior to Temodar administration if nausea/vomiting occurs. 30 tablet 1   pantoprazole (PROTONIX) 40 MG tablet Take 1 tablet (40 mg total) by mouth at bedtime. 30 tablet 3   temozolomide (TEMODAR) 100 MG capsule Take 1 capsule (100 mg total) by mouth daily. May take on an empty stomach to decrease nausea & vomiting. 5 capsule 0   temozolomide (TEMODAR) 140 MG capsule Take 1 capsule (140 mg total) by mouth daily. May take on an empty stomach to decrease  nausea & vomiting. 5 capsule 0   No current facility-administered medications on file prior to visit.    Allergies:  Allergies  Allergen Reactions   Penicillins Rash   Past Medical History:  Past Medical History:  Diagnosis Date   Hypertension    Past Surgical History:  Past Surgical History:  Procedure Laterality Date   APPLICATION OF CRANIAL  NAVIGATION N/A 03/13/2021   Procedure: APPLICATION OF CRANIAL NAVIGATION;  Surgeon: Karsten Ro, DO;  Location: [facility];  Service: Neurosurgery;  Laterality: N/A;   FRAMELESS  BIOPSY WITH BRAINLAB Right 03/13/2021   Procedure: Right Parietal-Occipital craniotomy, open biopsy for brain lesion;  Surgeon: Dawley, Theodoro Doing, DO;  Location: [facility];  Service: Neurosurgery;  Laterality: Right;   Social History:  Social History   Socioeconomic History   Marital status: Married    Spouse name: Not on file   Number of children: Not on file   Years of education: Not on file   Highest education level: Not on file  Occupational History   Not on file  Tobacco Use   Smoking status: Never   Smokeless tobacco: Never  Vaping Use   Vaping Use: Never used  Substance and Sexual Activity   Alcohol use: Not Currently   Drug use: Never   Sexual activity: Not on file  Other Topics Concern   Not on file  Social History Narrative   Not on file   Social Determinants of Health   Financial Resource Strain: Not on file  Food Insecurity: Not on file  Transportation Needs: Not on file  Physical Activity: Not on file  Stress: Not on file  Social Connections: Not on file  Intimate Partner Violence: Not on file   Family History:  Family History  Problem Relation Age of Onset   Brain cancer Neg Hx     Review of Systems: Constitutional: Doesn't report fevers, chills or abnormal weight loss Eyes: Doesn't report blurriness of vision Ears, nose, mouth, throat, and face: Doesn't report sore throat Respiratory: Doesn't report cough, dyspnea or wheezes Cardiovascular: Doesn't report palpitation, chest discomfort  Gastrointestinal:  Doesn't report nausea, constipation, diarrhea GU: Doesn't report incontinence Skin: Doesn't report skin rashes Neurological: Per HPI Musculoskeletal: Doesn't report joint pain Behavioral/Psych: Doesn't report anxiety  Physical Exam: Vitals:   07/16/21 1149  BP: 133/72   Pulse: 85  Resp: 17  Temp: 97.7 F (36.5 C)  SpO2: 96%    KPS: 70. General: Alert, cooperative, pleasant, in no acute distress Head: Normal EENT: No conjunctival injection or scleral icterus.  Lungs: Resp effort normal Cardiac: Regular rate Abdomen: Non-distended abdomen Skin: No rashes cyanosis or petechiae. Extremities: No clubbing or edema  Neurologic Exam: Mental Status: Awake, alert, attentive to examiner. Oriented to self and environment. Language is fluent with intact comprehension.  Some elements of agnosia, visual spatial neglect.  Noted anterograde amnesia. Cranial Nerves: Visual acuity is grossly normal. Visual fields are full. Extra-ocular movements intact. No ptosis. Face is symmetric Motor: Tone and bulk are normal. Power is full in both arms and legs. Reflexes are symmetric, no pathologic reflexes present.  Sensory: Intact to light touch Gait: Normal.   Labs: I have reviewed the data as listed    Component Value Date/Time   NA 138 06/12/2021 1207   K 3.6 06/12/2021 1207   CL 103 06/12/2021 1207   CO2 27 06/12/2021 1207   GLUCOSE 126 (H) 06/12/2021 1207   BUN 11 06/12/2021 1207   CREATININE 0.91 06/12/2021 1207  CALCIUM 9.6 06/12/2021 1207   PROT 6.7 06/12/2021 1207   ALBUMIN 3.5 06/12/2021 1207   AST 17 06/12/2021 1207   ALT 10 06/12/2021 1207   ALKPHOS 66 06/12/2021 1207   BILITOT 0.5 06/12/2021 1207   GFRNONAA >60 06/12/2021 1207   Lab Results  Component Value Date   WBC 5.6 07/16/2021   NEUTROABS 4.0 07/16/2021   HGB 12.2 07/16/2021   HCT 35.9 (L) 07/16/2021   MCV 93.7 07/16/2021   PLT 262 07/16/2021     Assessment/Plan High grade glioma not classifiable by WHO criteria ([place])  Seizure ([place])  Yorley Buch is clinically stable today, now having completed cycle #1 of adjuvant 5-day Temodar.  We recommended continuing treatment with cycle #2 Temozolomide dose escalated to 29m/m2, on for five days and off for twenty three days in  twenty eight day cycles. The patient will have a complete blood count performed on days 21 and 28 of each cycle, and a comprehensive metabolic panel performed on day 28 of each cycle. Labs may need to be performed more often. Zofran will prescribed for home use for nausea/vomiting.   Chemotherapy should be held for the following:  ANC less than 1,000  Platelets less than 100,000  LFT or creatinine greater than 2x ULN  If clinical concerns/contraindications develop  For seizures, she will continue Keppra 10019mBID as prior.  We ask that SuLynnell Fiumaraeturn to clinic in 1 months with MRI brain for evaluation prior to cycle #3, or sooner as needed.  All questions were answered. The patient knows to call the clinic with any problems, questions or concerns. No barriers to learning were detected.  The total time spent in the encounter was 30 minutes and more than 50% was on counseling and review of test results   ZaVentura SellersMD Medical Director of Neuro-Oncology [facility]1/14/22 11:25 AM

## 2021-07-16 NOTE — Telephone Encounter (Signed)
Scheduled per 11/14 los, attempted to call pt but could not get answer or voicemail. Will mail calender

## 2021-07-17 ENCOUNTER — Other Ambulatory Visit (HOSPITAL_BASED_OUTPATIENT_CLINIC_OR_DEPARTMENT_OTHER): Payer: Self-pay

## 2021-07-17 ENCOUNTER — Encounter: Payer: Self-pay | Admitting: Internal Medicine

## 2021-07-17 DIAGNOSIS — Z23 Encounter for immunization: Secondary | ICD-10-CM | POA: Diagnosis not present

## 2021-07-17 MED ORDER — MODERNA COVID-19 BIVAL BOOSTER 50 MCG/0.5ML IM SUSP
INTRAMUSCULAR | 0 refills | Status: AC
  Filled 2021-07-17: qty 0.5, 1d supply, fill #0

## 2021-07-19 ENCOUNTER — Inpatient Hospital Stay: Payer: Medicare Other

## 2021-07-19 ENCOUNTER — Inpatient Hospital Stay: Payer: Medicare Other | Admitting: Internal Medicine

## 2021-07-20 ENCOUNTER — Other Ambulatory Visit: Payer: Self-pay | Admitting: Internal Medicine

## 2021-07-20 MED ORDER — PROCHLORPERAZINE MALEATE 10 MG PO TABS: 10.0000 mg | ORAL_TABLET | Freq: Four times a day (QID) | ORAL | 0 refills | Status: AC | PRN

## 2021-07-24 ENCOUNTER — Other Ambulatory Visit: Payer: Self-pay | Admitting: Radiation Therapy

## 2021-07-31 ENCOUNTER — Ambulatory Visit: Payer: Medicare Other | Admitting: Medical

## 2021-08-02 ENCOUNTER — Other Ambulatory Visit (HOSPITAL_COMMUNITY): Payer: Self-pay

## 2021-08-04 ENCOUNTER — Other Ambulatory Visit: Payer: Self-pay | Admitting: Internal Medicine

## 2021-08-06 ENCOUNTER — Encounter: Payer: Self-pay | Admitting: Internal Medicine

## 2021-08-07 ENCOUNTER — Other Ambulatory Visit (HOSPITAL_COMMUNITY): Payer: Self-pay

## 2021-08-07 ENCOUNTER — Other Ambulatory Visit: Payer: Self-pay | Admitting: Internal Medicine

## 2021-08-07 DIAGNOSIS — C719 Malignant neoplasm of brain, unspecified: Secondary | ICD-10-CM

## 2021-08-09 ENCOUNTER — Encounter: Payer: Self-pay | Admitting: Internal Medicine

## 2021-08-09 ENCOUNTER — Other Ambulatory Visit (HOSPITAL_COMMUNITY): Payer: Self-pay

## 2021-08-09 NOTE — Telephone Encounter (Signed)
Patient will have lab and md visit prior to getting prescription refilled since chemotherapy.

## 2021-08-10 ENCOUNTER — Ambulatory Visit (HOSPITAL_COMMUNITY)
Admission: RE | Admit: 2021-08-10 | Discharge: 2021-08-10 | Disposition: A | Discharge status: Home / Self Care | Payer: Medicare Other | Source: Ambulatory Visit | Attending: Internal Medicine | Admitting: Internal Medicine

## 2021-08-10 ENCOUNTER — Other Ambulatory Visit: Payer: Self-pay

## 2021-08-10 DIAGNOSIS — D496 Neoplasm of unspecified behavior of brain: Secondary | ICD-10-CM | POA: Diagnosis not present

## 2021-08-10 DIAGNOSIS — C719 Malignant neoplasm of brain, unspecified: Secondary | ICD-10-CM | POA: Diagnosis not present

## 2021-08-10 IMAGING — MR MR HEAD WO/W CM
15 series · 48 of 48 positions shown · IV contrast (gadavist)
Comparison: Brain MRI [DATE], [DATE].

CLINICAL DATA: 79-year-old female with multifocal high-grade
glioma. Restaging.

EXAM:
MRI HEAD WITHOUT AND WITH CONTRAST
TECHNIQUE: Multiplanar, multiecho pulse sequences of the brain and surrounding
structures were obtained without and with intravenous contrast.
CONTRAST:  6mL GADAVIST GADOBUTROL 1 MMOL/ML IV SOLN

[Series 5: DWI · axial · 3.0mm · 1.36mm/px · z∈[-16,+125]mm · 5 of 96 slices shown (1 of 2)]
[im 1/96]
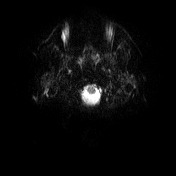
[im 24/96]
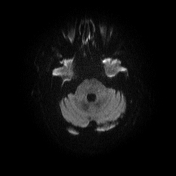
[im 48/96]
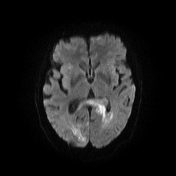
[im 72/96]
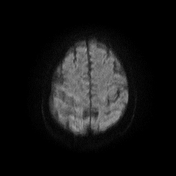
[im 96/96]
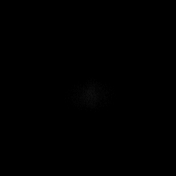

[Series 6: DWI · axial · 3.0mm · 1.36mm/px · z∈[-16,+122]mm · 3 of 47 slices shown (2 of 2)]
[im 1/47]
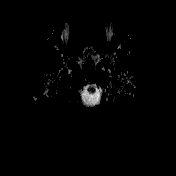
[im 24/47]
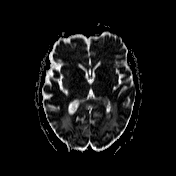
[im 47/47]
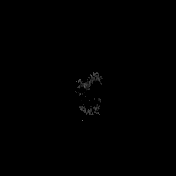

[Series 7: T1 · sagittal · 5.0mm · 0.75mm/px · 1 of 24 slices shown (1 of 4)]
[im 1/24]
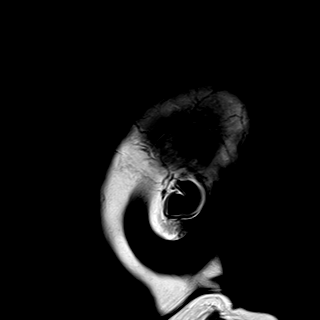

[Series 8: T2 · axial · 5.0mm · 0.62mm/px · 1 of 24 slices shown]
[im 1/24]
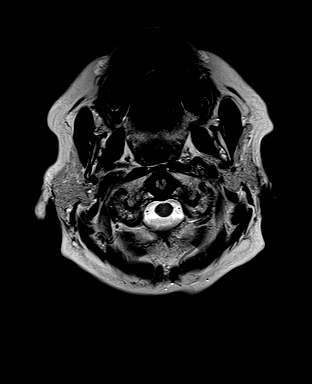

[Series 9: swi_images · axial · 3.0mm · 0.75mm/px · z∈[-23,+130]mm · 3 of 52 slices shown]
[im 1/52]
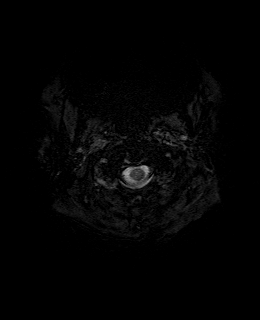
[im 26/52]
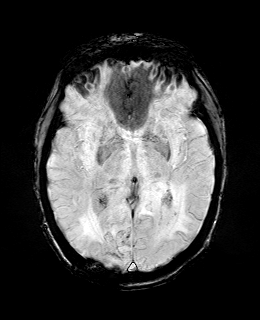
[im 52/52]
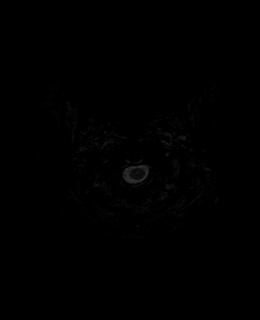

[Series 11: FLAIR · axial · 3.0mm · 0.75mm/px · z∈[-23,+130]mm · 3 of 52 slices shown]
[im 1/52]
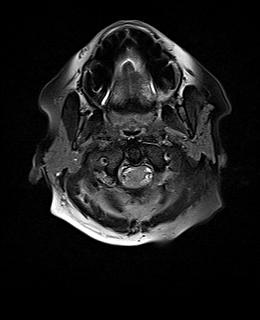
[im 26/52]
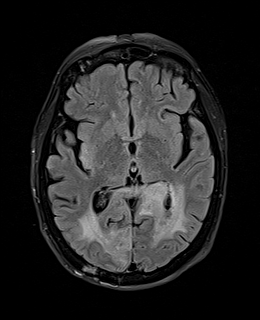
[im 52/52]
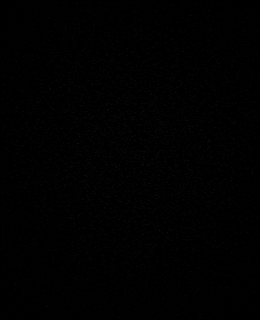

[Series 12: T1 · axial · 1.0mm · 0.94mm/px · z∈[-19,+124]mm · 9 of 144 slices shown (2 of 4)]
[im 1/144]
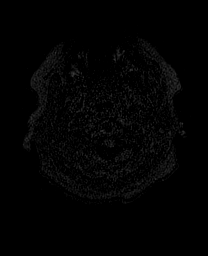
[im 18/144]
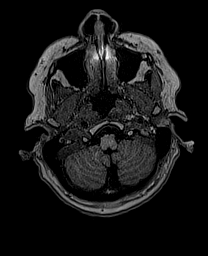
[im 36/144]
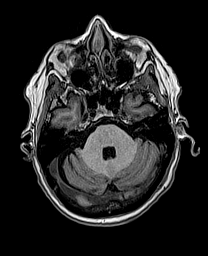
[im 54/144]
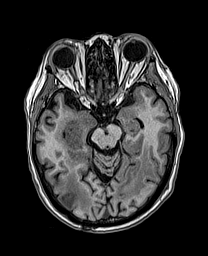
[im 72/144]
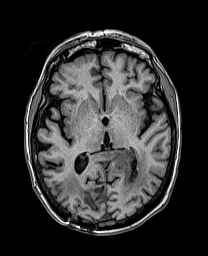
[im 90/144]
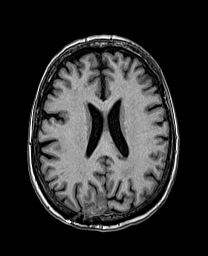
[im 108/144]
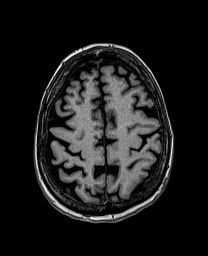
[im 126/144]
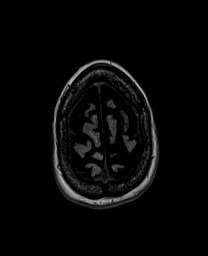
[im 144/144]
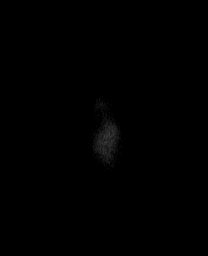

[Series 13: cor dwi_tracew · coronal · 5.0mm · 1.53mm/px · 3 of 56 slices shown]
[im 1/56]
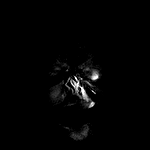
[im 28/56]
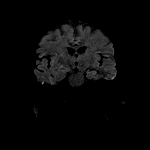
[im 56/56]
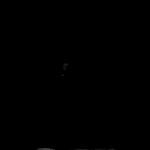

[Series 14: cor dwi_adc · coronal · 5.0mm · 1.53mm/px · 2 of 28 slices shown]
[im 1/28]
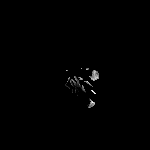
[im 28/28]
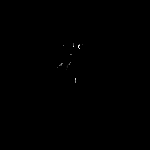

[Series 15: T2 post-contrast · coronal · 5.0mm · 0.57mm/px · 2 of 28 slices shown]
[im 1/28]
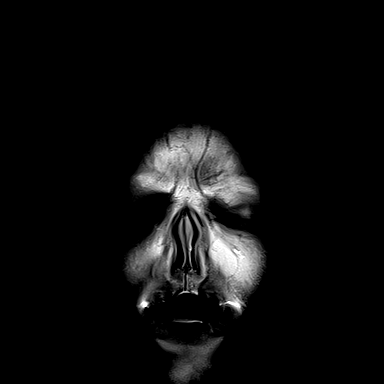
[im 28/28]
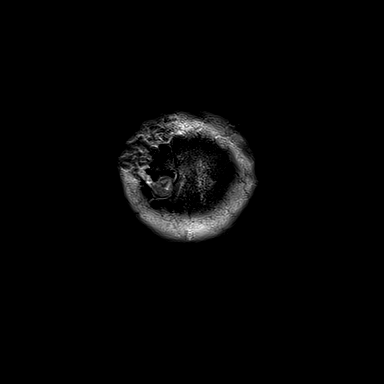

[Series 16: T1 post-contrast · axial · 1.0mm · 0.94mm/px · z∈[-19,+124]mm · 9 of 144 slices shown (1 of 3)]
[im 1/144]
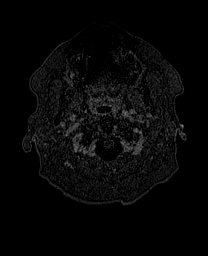
[im 18/144]
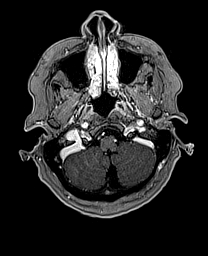
[im 36/144]
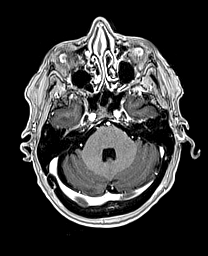
[im 54/144]
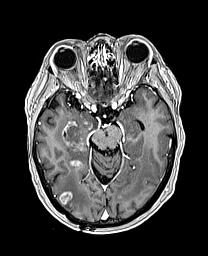
[im 72/144]
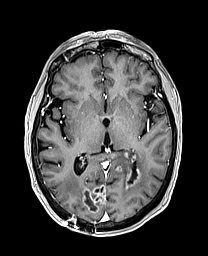
[im 90/144]
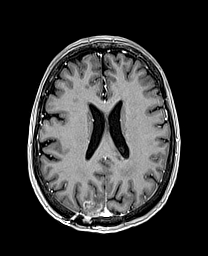
[im 108/144]
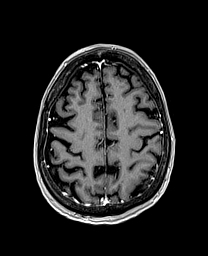
[im 126/144]
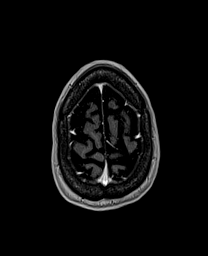
[im 144/144]
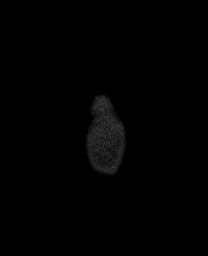

[Series 17: T1 · sagittal · 4.0mm · 0.94mm/px · 2 of 30 slices shown (3 of 4)]
[im 1/30]
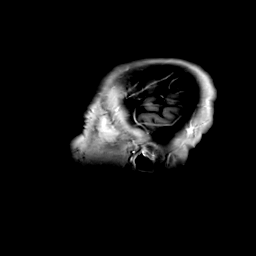
[im 30/30]
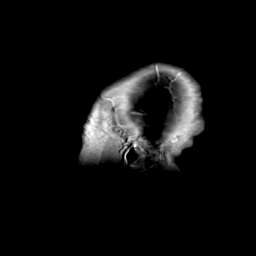

[Series 18: T1 · coronal · 4.0mm · 0.94mm/px · 2 of 30 slices shown (4 of 4)]
[im 1/30]
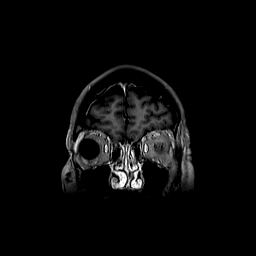
[im 30/30]
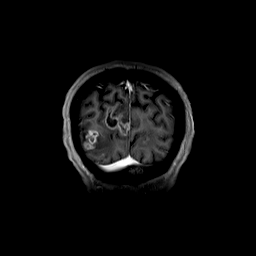

[Series 19: T1 post-contrast · coronal · 5.0mm · 0.43mm/px · 2 of 28 slices shown (2 of 3)]
[im 1/28]
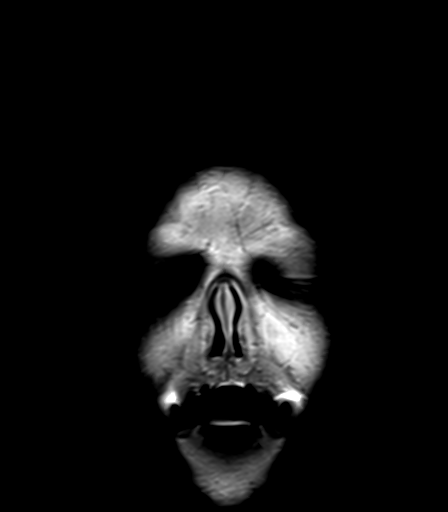
[im 28/28]
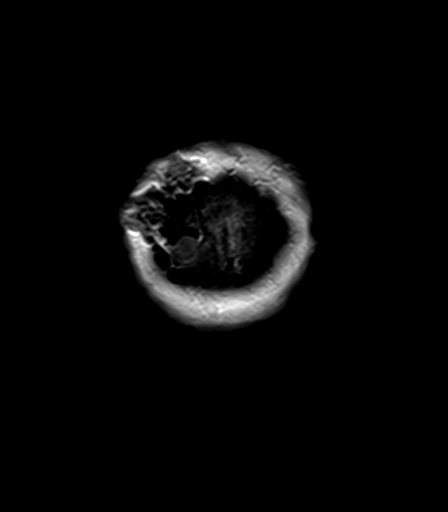

[Series 20: T1 post-contrast · sagittal · 5.0mm · 0.75mm/px · 1 of 24 slices shown (3 of 3)]
[im 1/24]
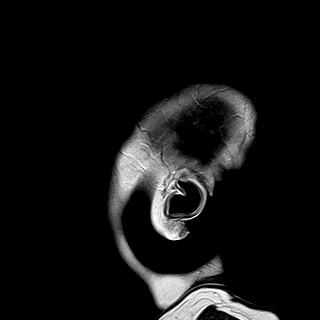

[48 of 48 positions shown; findings below may reference images not displayed]

FINDINGS: Brain: Previous craniotomy changes posteriorly on the right near
midline. Infiltrative T2 and FLAIR hyperintense tumor with nodular
and irregular enhancement in both hemispheres concentrated from the
splenium of the corpus callosum along the bilateral
parieto-occipital lobes and into the periatrial white matter and
mesial temporal lobes (the latter greater on the right).

Compared to [DATE], nodular and irregular enhancement throughout
the affected areas has not significantly changed. Although there is
more solid nodular enhancement in the right hippocampus now (series
16, image 56 versus image [REDACTED]).

However, associated abnormal diffusion consistent with
hypercellularity has progressed in the left splenium

The abnormal enhancement involves the left lateral ventricle atrium
and occipital horn as before. Right temporal horn also involved. But
no new/disseminated ependymal enhancement from there. No dural
thickening.

Associated FLAIR hyperintensity and mild regional mass effect not
significantly changed. Note T2/FLAIR involvement of the posterior
right insula.

No midline shift. Basilar cisterns remain patent. No
ventriculomegaly. Hemosiderin underlying the posterior craniotomy is
stable. No acute intracranial hemorrhage identified. Negative
pituitary and cervicomedullary junction.

Vascular: Major intracranial vascular flow voids are stable. The
major dural venous sinuses are enhancing and appear to be patent.

Skull and upper cervical spine: Stable visible cervical spine
degeneration. Visualized bone marrow signal is within normal limits.

Sinuses/Orbits: Stable, negative.

Other: Mastoid effusions have regressed. Other Visible internal
auditory structures appear normal.
IMPRESSION: Multifocal high-grade glioma. Slight progression since [REDACTED] on
the basis of increasing hypercellularity along the left splenium and
more solid-appearing nodular enhancement in the right hippocampus.
Infiltrative T2/FLAIR hyperintensity and mild regional mass effect
not significantly changed.

## 2021-08-10 MED ORDER — GADOBUTROL 1 MMOL/ML IV SOLN
6.0000 mL | Freq: Once | INTRAVENOUS | Status: AC | PRN
  Administered 2021-08-10: 6 mL via INTRAVENOUS

## 2021-08-13 ENCOUNTER — Inpatient Hospital Stay: Payer: Medicare Other | Attending: Neurological Surgery

## 2021-08-13 ENCOUNTER — Other Ambulatory Visit: Payer: Medicare Other

## 2021-08-13 ENCOUNTER — Ambulatory Visit: Payer: Medicare Other | Admitting: Internal Medicine

## 2021-08-13 DIAGNOSIS — D649 Anemia, unspecified: Secondary | ICD-10-CM | POA: Insufficient documentation

## 2021-08-13 DIAGNOSIS — Z923 Personal history of irradiation: Secondary | ICD-10-CM | POA: Insufficient documentation

## 2021-08-13 DIAGNOSIS — Z79899 Other long term (current) drug therapy: Secondary | ICD-10-CM | POA: Insufficient documentation

## 2021-08-13 DIAGNOSIS — C718 Malignant neoplasm of overlapping sites of brain: Secondary | ICD-10-CM | POA: Insufficient documentation

## 2021-08-13 DIAGNOSIS — Z9221 Personal history of antineoplastic chemotherapy: Secondary | ICD-10-CM | POA: Insufficient documentation

## 2021-08-15 ENCOUNTER — Other Ambulatory Visit (HOSPITAL_COMMUNITY): Payer: Self-pay

## 2021-08-15 ENCOUNTER — Other Ambulatory Visit: Payer: Self-pay | Admitting: Internal Medicine

## 2021-08-15 DIAGNOSIS — C719 Malignant neoplasm of brain, unspecified: Secondary | ICD-10-CM

## 2021-08-16 ENCOUNTER — Encounter: Payer: Self-pay | Admitting: Internal Medicine

## 2021-08-16 ENCOUNTER — Telehealth: Payer: Self-pay

## 2021-08-16 ENCOUNTER — Other Ambulatory Visit: Payer: Self-pay

## 2021-08-16 ENCOUNTER — Inpatient Hospital Stay: Payer: Medicare Other

## 2021-08-16 ENCOUNTER — Inpatient Hospital Stay (HOSPITAL_BASED_OUTPATIENT_CLINIC_OR_DEPARTMENT_OTHER): Payer: Medicare Other | Admitting: Internal Medicine

## 2021-08-16 VITALS — BP 108/57 | HR 86 | Temp 97.3°F | Resp 18 | Wt 131.5 lb

## 2021-08-16 DIAGNOSIS — D701 Agranulocytosis secondary to cancer chemotherapy: Secondary | ICD-10-CM

## 2021-08-16 DIAGNOSIS — R569 Unspecified convulsions: Secondary | ICD-10-CM | POA: Diagnosis not present

## 2021-08-16 DIAGNOSIS — T451X5A Adverse effect of antineoplastic and immunosuppressive drugs, initial encounter: Secondary | ICD-10-CM | POA: Diagnosis not present

## 2021-08-16 DIAGNOSIS — D709 Neutropenia, unspecified: Secondary | ICD-10-CM | POA: Insufficient documentation

## 2021-08-16 DIAGNOSIS — C718 Malignant neoplasm of overlapping sites of brain: Secondary | ICD-10-CM | POA: Diagnosis not present

## 2021-08-16 DIAGNOSIS — Z9221 Personal history of antineoplastic chemotherapy: Secondary | ICD-10-CM | POA: Diagnosis not present

## 2021-08-16 DIAGNOSIS — D649 Anemia, unspecified: Secondary | ICD-10-CM | POA: Diagnosis not present

## 2021-08-16 DIAGNOSIS — C719 Malignant neoplasm of brain, unspecified: Secondary | ICD-10-CM

## 2021-08-16 DIAGNOSIS — Z923 Personal history of irradiation: Secondary | ICD-10-CM | POA: Diagnosis not present

## 2021-08-16 DIAGNOSIS — Z79899 Other long term (current) drug therapy: Secondary | ICD-10-CM | POA: Diagnosis not present

## 2021-08-16 LAB — CMP (CANCER CENTER ONLY)
ALT: 8 U/L (ref 0–44)
AST: 14 U/L — ABNORMAL LOW (ref 15–41)
Albumin: 3.5 g/dL (ref 3.5–5.0)
Alkaline Phosphatase: 65 U/L (ref 38–126)
Anion gap: 9 (ref 5–15)
BUN: 14 mg/dL (ref 8–23)
CO2: 26 mmol/L (ref 22–32)
Calcium: 9.4 mg/dL (ref 8.9–10.3)
Chloride: 106 mmol/L (ref 98–111)
Creatinine: 0.98 mg/dL (ref 0.44–1.00)
GFR, Estimated: >60 mL/min (ref >60)
Glucose, Bld: 129 mg/dL — ABNORMAL HIGH (ref 70–99)
Potassium: 3.5 mmol/L (ref 3.5–5.1)
Sodium: 141 mmol/L (ref 135–145)
Total Bilirubin: 0.4 mg/dL (ref 0.3–1.2)
Total Protein: 6.7 g/dL (ref 6.5–8.1)

## 2021-08-16 LAB — CBC WITH DIFFERENTIAL (CANCER CENTER ONLY)
Abs Immature Granulocytes: 0 10*3/uL (ref 0.00–0.07)
Basophils Absolute: 0 10*3/uL (ref 0.0–0.1)
Basophils Relative: 0 %
Eosinophils Absolute: 0 10*3/uL (ref 0.0–0.5)
Eosinophils Relative: 1 %
HCT: 29.9 % — ABNORMAL LOW (ref 36.0–46.0)
Hemoglobin: 10.3 g/dL — ABNORMAL LOW (ref 12.0–15.0)
Immature Granulocytes: 0 %
Lymphocytes Relative: 71 %
Lymphs Abs: 0.7 10*3/uL (ref 0.7–4.0)
MCH: 32.5 pg (ref 26.0–34.0)
MCHC: 34.4 g/dL (ref 30.0–36.0)
MCV: 94.3 fL (ref 80.0–100.0)
Monocytes Absolute: 0.1 10*3/uL (ref 0.1–1.0)
Monocytes Relative: 10 %
Neutro Abs: 0.2 10*3/uL — CL (ref 1.7–7.7)
Neutrophils Relative %: 18 %
Platelet Count: 122 10*3/uL — ABNORMAL LOW (ref 150–400)
RBC: 3.17 MIL/uL — ABNORMAL LOW (ref 3.87–5.11)
RDW: 13.1 % (ref 11.5–15.5)
Smear Review: NORMAL
WBC Count: 1 10*3/uL — ABNORMAL LOW (ref 4.0–10.5)
nRBC: 0 % (ref 0.0–0.2)

## 2021-08-16 NOTE — Telephone Encounter (Signed)
CRITICAL VALUE STICKER  CRITICAL VALUE:  ACN 0.2  RECEIVER (on-site recipient of call):  Rondel Baton, LPN   DATE & TIME NOTIFIED: 08/16/21  AT 13:02  MESSENGER (representative from lab):  Lauren  MD NOTIFIED: Mickeal Skinner  TIME OF NOTIFICATION:  13:05  RESPONSE: MD aware.   pt to be seen for OV today

## 2021-08-16 NOTE — Progress Notes (Signed)
[facility] [place], [place] [address] [phone]   Interval Evaluation  Date of Service: 08/16/21 Patient Name: Andrea [name] Patient MRN: [id number] Patient DOB: 01-23-1946 Provider: Ventura Sellers, MD  Identifying Statement:  Andrea [name] is a 75 y.o. 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 Reatha Armour; path is high grade glioma    03/29/2021 Cancer Staging   Staging form: Brain and Spinal Cord, AJCC 8th Edition - Pathologic: WHO Grade IV - Signed by Ventura Sellers, MD on 03/29/2021 Histologic grading system: 4 grade system Extent of surgical resection: Biopsy Solitary (s) or multifocal (m) tumors in the primary site: Multifocal Karnofsky performance status: Score 70 Seizures at presentation: Present Duration of symptoms before diagnosis: Short IDH1 mutation: Negative    04/23/2021 - 05/14/2021 Radiation Therapy   3 weeks IMRT and Temodar with Dr. Isidore Moos   06/18/2021 -  Chemotherapy   Patient is on Treatment Plan : BRAIN GLIOBLASTOMA Consolidation Temozolomide Days 1-5 q28 Days        Biomarkers:  MGMT Unknown.  IDH 1/2 Wild type.  EGFR Unknown  TERT Unknown   Interval History: Shacoya Burkhammer presents today for follow up, now having completed cycle #2 of 5-day Temodar.  No significant issues with chemo. Fatigue burden and short term memory impairment are worse this month. She continues to describe difficulty with navigating spaces (homes, buildings, hallways, etc.) and relies on daughter for direction, as prior.  Otherwise continues with functional independence.  Only mild fatigue, no seizures.  H+P (03/29/21) Patient presented to medical attention earlier this month with first ever seizure.  She was driving her car, was found minimally responsive along the highway;  patient is amnestic of the event.  CNS imaging demonstrated multifocal enhancing masses; biopsy was performed on 03/13/21 by Dr. Reatha Armour, with path sent out to [facility] demonstrating IDH-1 wild type high grade glioma, otherwise not graded.  Following surgery she has not experienced further seizures.  Family describes some difficulty at home navigating from room to room, forgetting how to get to the bathroom.  Also described is impaired short term memory, forgetting conversations she had even minutes earlier. Otherwise able to walk independently, no help needed with ADLs.   Medications: Current Outpatient Medications on File Prior to Visit  Medication Sig Dispense Refill   ALPRAZolam (XANAX) 0.25 MG tablet Take 1 tablet (0.25 mg total) by mouth 3 (three) times daily as needed. 30 tablet 0   benazepril-hydrochlorthiazide (LOTENSIN HCT) 10-12.5 MG tablet Take 1 tablet by mouth 2 (two) times daily.     levETIRAcetam (KEPPRA) 1000 MG tablet Take 1 tablet (1,000 mg total) by mouth 2 (two) times daily. 60 tablet 3   ondansetron (ZOFRAN) 8 MG tablet Take 1 tablet (8 mg total) by mouth 2 (two) times daily as needed (nausea and vomiting). May take 30-60 minutes prior to Temodar administration if nausea/vomiting occurs. 30 tablet 1   pantoprazole (PROTONIX) 40 MG tablet TAKE 1 TABLET(40 MG) BY MOUTH AT BEDTIME 30 tablet 3   prochlorperazine (COMPAZINE) 10 MG tablet Take 1 tablet (10 mg total) by mouth every 6 (six) hours as needed for nausea or vomiting. 30 tablet 0   COVID-19 mRNA bivalent vaccine, Moderna, (MODERNA COVID-19 BIVAL BOOSTER) 50 MCG/0.5ML injection Inject into the muscle. 0.5 mL 0  temozolomide (TEMODAR) 100 MG capsule Take 2 capsules (200 mg total) by mouth daily. May take on an empty stomach to decrease nausea & vomiting. 10 capsule 0   temozolomide (TEMODAR) 140 MG capsule Take 1 capsule (140 mg total) by mouth daily. May take on an empty stomach to decrease nausea & vomiting. 5 capsule 0   No  current facility-administered medications on file prior to visit.    Allergies:  Allergies  Allergen Reactions   Penicillins Rash   Past Medical History:  Past Medical History:  Diagnosis Date   Hypertension    Past Surgical History:  Past Surgical History:  Procedure Laterality Date   APPLICATION OF CRANIAL NAVIGATION N/A 03/13/2021   Procedure: APPLICATION OF CRANIAL NAVIGATION;  Surgeon: Karsten Ro, DO;  Location: [facility];  Service: Neurosurgery;  Laterality: N/A;   FRAMELESS  BIOPSY WITH BRAINLAB Right 03/13/2021   Procedure: Right Parietal-Occipital craniotomy, open biopsy for brain lesion;  Surgeon: Dawley, Theodoro Doing, DO;  Location: [facility];  Service: Neurosurgery;  Laterality: Right;   Social History:  Social History   Socioeconomic History   Marital status: Married    Spouse name: Not on file   Number of children: Not on file   Years of education: Not on file   Highest education level: Not on file  Occupational History   Not on file  Tobacco Use   Smoking status: Never   Smokeless tobacco: Never  Vaping Use   Vaping Use: Never used  Substance and Sexual Activity   Alcohol use: Not Currently   Drug use: Never   Sexual activity: Not on file  Other Topics Concern   Not on file  Social History Narrative   Not on file   Social Determinants of Health   Financial Resource Strain: Not on file  Food Insecurity: Not on file  Transportation Needs: Not on file  Physical Activity: Not on file  Stress: Not on file  Social Connections: Not on file  Intimate Partner Violence: Not on file   Family History:  Family History  Problem Relation Age of Onset   Brain cancer Neg Hx     Review of Systems: Constitutional: Doesn't report fevers, chills or abnormal weight loss Eyes: Doesn't report blurriness of vision Ears, nose, mouth, throat, and face: Doesn't report sore throat Respiratory: Doesn't report cough, dyspnea or wheezes Cardiovascular: Doesn't report  palpitation, chest discomfort  Gastrointestinal:  Doesn't report nausea, constipation, diarrhea GU: Doesn't report incontinence Skin: Doesn't report skin rashes Neurological: Per HPI Musculoskeletal: Doesn't report joint pain Behavioral/Psych: Doesn't report anxiety  Physical Exam: Vitals:   08/16/21 1235  BP: (!) 108/57  Pulse: 86  Resp: 18  Temp: (!) 97.3 F (36.3 C)  SpO2: 98%    KPS: 70. General: Alert, cooperative, pleasant, in no acute distress Head: Normal EENT: No conjunctival injection or scleral icterus.  Lungs: Resp effort normal Cardiac: Regular rate Abdomen: Non-distended abdomen Skin: No rashes cyanosis or petechiae. Extremities: No clubbing or edema  Neurologic Exam: Mental Status: Awake, alert, attentive to examiner. Oriented to self and environment. Language is fluent with intact comprehension.  Some elements of agnosia, visual spatial neglect.  Noted anterograde amnesia. Cranial Nerves: Visual acuity is grossly normal. Visual fields are full. Extra-ocular movements intact. No ptosis. Face is symmetric Motor: Tone and bulk are normal. Power is full in both arms and legs. Reflexes are symmetric, no pathologic reflexes present.  Sensory: Intact to light touch Gait: Normal.   Labs: I have  reviewed the data as listed    Component Value Date/Time   NA 141 08/16/2021 1212   K 3.5 08/16/2021 1212   CL 106 08/16/2021 1212   CO2 26 08/16/2021 1212   GLUCOSE 129 (H) 08/16/2021 1212   BUN 14 08/16/2021 1212   CREATININE 0.98 08/16/2021 1212   CALCIUM 9.4 08/16/2021 1212   PROT 6.7 08/16/2021 1212   ALBUMIN 3.5 08/16/2021 1212   AST 14 (L) 08/16/2021 1212   ALT 8 08/16/2021 1212   ALKPHOS 65 08/16/2021 1212   BILITOT 0.4 08/16/2021 1212   GFRNONAA >60 08/16/2021 1212   Lab Results  Component Value Date   WBC 1.0 (L) 08/16/2021   NEUTROABS 0.2 (LL) 08/16/2021   HGB 10.3 (L) 08/16/2021   HCT 29.9 (L) 08/16/2021   MCV 94.3 08/16/2021   PLT 122 (L)  08/16/2021   Imaging:  [facility] Clinician Interpretation: I have personally reviewed the CNS images as listed.  My interpretation, in the context of the patient's clinical presentation, is progressive disease  MR BRAIN W WO CONTRAST  Result Date: 08/11/2021 CLINICAL DATA:  75 year old female with multifocal high-grade glioma. Restaging. EXAM: MRI HEAD WITHOUT AND WITH CONTRAST TECHNIQUE: Multiplanar, multiecho pulse sequences of the brain and surrounding structures were obtained without and with intravenous contrast. CONTRAST:  38m GADAVIST GADOBUTROL 1 MMOL/ML IV SOLN COMPARISON:  Brain MRI 06/08/2021, 03/07/2021. FINDINGS: Brain: Previous craniotomy changes posteriorly on the right near midline. Infiltrative T2 and FLAIR hyperintense tumor with nodular and irregular enhancement in both hemispheres concentrated from the splenium of the corpus callosum along the bilateral parieto-occipital lobes and into the periatrial white matter and mesial temporal lobes (the latter greater on the right). Compared to 06/08/2021, nodular and irregular enhancement throughout the affected areas has not significantly changed. Although there is more solid nodular enhancement in the right hippocampus now (series 16, image 56 versus image 54 and October). However, associated abnormal diffusion consistent with hypercellularity has progressed in the left splenium The abnormal enhancement involves the left lateral ventricle atrium and occipital horn as before. Right temporal horn also involved. But no new/disseminated ependymal enhancement from there. No dural thickening. Associated FLAIR hyperintensity and mild regional mass effect not significantly changed. Note T2/FLAIR involvement of the posterior right insula. No midline shift. Basilar cisterns remain patent. No ventriculomegaly. Hemosiderin underlying the posterior craniotomy is stable. No acute intracranial hemorrhage identified. Negative pituitary and cervicomedullary  junction. Vascular: Major intracranial vascular flow voids are stable. The major dural venous sinuses are enhancing and appear to be patent. Skull and upper cervical spine: Stable visible cervical spine degeneration. Visualized bone marrow signal is within normal limits. Sinuses/Orbits: Stable, negative. Other: Mastoid effusions have regressed. Other Visible internal auditory structures appear normal. IMPRESSION: Multifocal high-grade glioma. Slight progression since October on the basis of increasing hypercellularity along the left splenium and more solid-appearing nodular enhancement in the right hippocampus. Infiltrative T2/FLAIR hyperintensity and mild regional mass effect not significantly changed. Electronically Signed   By: HGenevie AnnM.D.   On: 08/11/2021 11:19    Assessment/Plan High grade glioma not classifiable by WHO criteria ([facility]  Seizure ([place]  SMalyah Ohlrichis clinically stable today from focal neurologic standpoint, now having completed cycle #2 of adjuvant 5-day Temodar.  MRI demonstrates progression of disease within right hippocampal space, as well as propagation of DWI signal abnormality along splenium of corpus callosum.  Labs demonstrate neutropenia as response to alkylating chemotherapy.  We advised her regarding neutropenic precautions and neutropenic fever.  She  has no clinical signs of systemic infection at this time.  She will return for repeat labs, CBC w/diff, in 5 days once past Temodar nadir.    Chemotherapy should be held for the following:  ANC less than 1,000  Platelets less than 100,000  LFT or creatinine greater than 2x ULN  If clinical concerns/contraindications develop  For seizures, she will continue Keppra 1049m BID as prior.  We ask that SMaurianna Benardreturn to clinic in 1 week with labs for evaluation.  Further cytotoxic chemotherapy will be deferred indefinitely, next brain MRI TBD.  All questions were answered. The patient knows to call the clinic  with any problems, questions or concerns. No barriers to learning were detected.  The total time spent in the encounter was 40 minutes and more than 50% was on counseling and review of test results   ZVentura Sellers MD Medical Director of Neuro-Oncology [facility] W[facility] [place]12/15/22 12:59 PM

## 2021-08-16 NOTE — Telephone Encounter (Signed)
Patient has an appt today.  Gardiner Rhyme, RN

## 2021-08-17 ENCOUNTER — Telehealth: Payer: Self-pay | Admitting: Internal Medicine

## 2021-08-17 ENCOUNTER — Other Ambulatory Visit (HOSPITAL_COMMUNITY): Payer: Self-pay

## 2021-08-17 NOTE — Telephone Encounter (Signed)
Scheduled per 12/15 los, left message with pts daughter

## 2021-08-23 ENCOUNTER — Other Ambulatory Visit (HOSPITAL_COMMUNITY): Payer: Self-pay

## 2021-08-23 ENCOUNTER — Other Ambulatory Visit: Payer: Self-pay

## 2021-08-23 ENCOUNTER — Inpatient Hospital Stay (HOSPITAL_BASED_OUTPATIENT_CLINIC_OR_DEPARTMENT_OTHER): Payer: Medicare Other | Admitting: Internal Medicine

## 2021-08-23 ENCOUNTER — Inpatient Hospital Stay: Payer: Medicare Other

## 2021-08-23 VITALS — BP 117/66 | HR 88 | Temp 98.2°F | Resp 17 | Ht 66.0 in | Wt 130.0 lb

## 2021-08-23 DIAGNOSIS — Z9221 Personal history of antineoplastic chemotherapy: Secondary | ICD-10-CM | POA: Diagnosis not present

## 2021-08-23 DIAGNOSIS — D649 Anemia, unspecified: Secondary | ICD-10-CM | POA: Diagnosis not present

## 2021-08-23 DIAGNOSIS — C719 Malignant neoplasm of brain, unspecified: Secondary | ICD-10-CM

## 2021-08-23 DIAGNOSIS — Z79899 Other long term (current) drug therapy: Secondary | ICD-10-CM | POA: Diagnosis not present

## 2021-08-23 DIAGNOSIS — Z923 Personal history of irradiation: Secondary | ICD-10-CM | POA: Diagnosis not present

## 2021-08-23 DIAGNOSIS — R569 Unspecified convulsions: Secondary | ICD-10-CM

## 2021-08-23 DIAGNOSIS — C718 Malignant neoplasm of overlapping sites of brain: Secondary | ICD-10-CM | POA: Diagnosis not present

## 2021-08-23 LAB — CBC WITH DIFFERENTIAL (CANCER CENTER ONLY)
Abs Immature Granulocytes: 0.02 10*3/uL (ref 0.00–0.07)
Basophils Absolute: 0 10*3/uL (ref 0.0–0.1)
Basophils Relative: 0 %
Eosinophils Absolute: 0 10*3/uL (ref 0.0–0.5)
Eosinophils Relative: 0 %
HCT: 28.7 % — ABNORMAL LOW (ref 36.0–46.0)
Hemoglobin: 9.9 g/dL — ABNORMAL LOW (ref 12.0–15.0)
Immature Granulocytes: 1 %
Lymphocytes Relative: 34 %
Lymphs Abs: 0.8 10*3/uL (ref 0.7–4.0)
MCH: 32.9 pg (ref 26.0–34.0)
MCHC: 34.5 g/dL (ref 30.0–36.0)
MCV: 95.3 fL (ref 80.0–100.0)
Monocytes Absolute: 0.4 10*3/uL (ref 0.1–1.0)
Monocytes Relative: 16 %
Neutro Abs: 1.2 10*3/uL — ABNORMAL LOW (ref 1.7–7.7)
Neutrophils Relative %: 49 %
Platelet Count: 388 10*3/uL (ref 150–400)
RBC: 3.01 MIL/uL — ABNORMAL LOW (ref 3.87–5.11)
RDW: 14.2 % (ref 11.5–15.5)
WBC Count: 2.5 10*3/uL — ABNORMAL LOW (ref 4.0–10.5)
nRBC: 0 % (ref 0.0–0.2)

## 2021-08-23 LAB — CMP (CANCER CENTER ONLY)
ALT: 7 U/L (ref 0–44)
AST: 14 U/L — ABNORMAL LOW (ref 15–41)
Albumin: 3.8 g/dL (ref 3.5–5.0)
Alkaline Phosphatase: 58 U/L (ref 38–126)
Anion gap: 7 (ref 5–15)
BUN: 16 mg/dL (ref 8–23)
CO2: 27 mmol/L (ref 22–32)
Calcium: 9.6 mg/dL (ref 8.9–10.3)
Chloride: 106 mmol/L (ref 98–111)
Creatinine: 1.01 mg/dL — ABNORMAL HIGH (ref 0.44–1.00)
GFR, Estimated: 58 mL/min — ABNORMAL LOW (ref >60)
Glucose, Bld: 120 mg/dL — ABNORMAL HIGH (ref 70–99)
Potassium: 3.4 mmol/L — ABNORMAL LOW (ref 3.5–5.1)
Sodium: 140 mmol/L (ref 135–145)
Total Bilirubin: 0.3 mg/dL (ref 0.3–1.2)
Total Protein: 6.6 g/dL (ref 6.5–8.1)

## 2021-08-23 MED ORDER — TEMOZOLOMIDE 140 MG PO CAPS
140.0000 mg | ORAL_CAPSULE | Freq: Every day | ORAL | 0 refills | Status: DC
  Filled 2021-08-23: qty 5, 28d supply, fill #0

## 2021-08-23 MED ORDER — TEMOZOLOMIDE 100 MG PO CAPS
100.0000 mg | ORAL_CAPSULE | Freq: Every day | ORAL | 0 refills | Status: DC
  Filled 2021-08-23: qty 5, 28d supply, fill #0

## 2021-08-23 NOTE — Progress Notes (Signed)
[facility] at [place], [place] [address] [phone]   Interval Evaluation  Date of Service: 08/23/21 Patient Name: Andrea [name] Patient MRN: [id number] Patient DOB: 16-Dec-1945 Provider: Ventura Sellers, MD  Identifying Statement:  [name] is a 75 y.o. female with  multifocal   high grade glioma     Oncologic History: Oncology History  High grade glioma not classifiable by WHO criteria ([place])  03/13/2021 Surgery   Open biopsy by Dr. Reatha Armour; path is high grade glioma    03/29/2021 Cancer Staging   Staging form: Brain and Spinal Cord, AJCC 8th Edition - Pathologic: WHO Grade IV - Signed by Ventura Sellers, MD on 03/29/2021 Histologic grading system: 4 grade system Extent of surgical resection: Biopsy Solitary (s) or multifocal (m) tumors in the primary site: Multifocal Karnofsky performance status: Score 70 Seizures at presentation: Present Duration of symptoms before diagnosis: Short IDH1 mutation: Negative    04/23/2021 - 05/14/2021 Radiation Therapy   3 weeks IMRT and Temodar with Dr. Isidore Moos   06/18/2021 -  Chemotherapy   Patient is on Treatment Plan : BRAIN GLIOBLASTOMA Consolidation Temozolomide Days 1-5 q28 Days        Biomarkers:  MGMT Unknown.  IDH 1/2 Wild type.  EGFR Unknown  TERT Unknown   Interval History: [name] presents today for follow up with labs for evaluation.  No progressive deficits from last week, fatigue burden and short term memory impairment continue as prior. She continues to describe difficulty with navigating spaces (homes, buildings, hallways, etc.) and relies on daughter for direction, as prior.  Otherwise continues with functional independence.  Only mild fatigue, no seizures.  H+P (03/29/21) Patient presented to medical attention earlier this month with first ever seizure.  She was driving her car, was found minimally responsive along the highway; patient is amnestic of  the event.  CNS imaging demonstrated multifocal enhancing masses; biopsy was performed on 03/13/21 by Dr. Reatha Armour, with path sent out to [facility] demonstrating IDH-1 wild type high grade glioma, otherwise not graded.  Following surgery she has not experienced further seizures.  Family describes some difficulty at home navigating from room to room, forgetting how to get to the bathroom.  Also described is impaired short term memory, forgetting conversations she had even minutes earlier. Otherwise able to walk independently, no help needed with ADLs.   Medications: Current Outpatient Medications on File Prior to Visit  Medication Sig Dispense Refill   ALPRAZolam (XANAX) 0.25 MG tablet Take 1 tablet (0.25 mg total) by mouth 3 (three) times daily as needed. 30 tablet 0   benazepril-hydrochlorthiazide (LOTENSIN HCT) 10-12.5 MG tablet Take 1 tablet by mouth 2 (two) times daily.     COVID-19 mRNA bivalent vaccine, Moderna, (MODERNA COVID-19 BIVAL BOOSTER) 50 MCG/0.5ML injection Inject into the muscle. 0.5 mL 0   levETIRAcetam (KEPPRA) 1000 MG tablet Take 1 tablet (1,000 mg total) by mouth 2 (two) times daily. 60 tablet 3   ondansetron (ZOFRAN) 8 MG tablet Take 1 tablet (8 mg total) by mouth 2 (two) times daily as needed (nausea and vomiting). May take 30-60 minutes prior to Temodar administration if nausea/vomiting occurs. 30 tablet 1   pantoprazole (PROTONIX) 40 MG tablet TAKE 1 TABLET(40 MG) BY MOUTH AT BEDTIME 30 tablet 3   prochlorperazine (COMPAZINE) 10 MG tablet Take 1 tablet (10 mg total) by mouth every 6 (six) hours as needed for nausea or vomiting. 30 tablet 0   temozolomide (TEMODAR)  100 MG capsule Take 2 capsules (200 mg total) by mouth daily. May take on an empty stomach to decrease nausea & vomiting. 10 capsule 0   temozolomide (TEMODAR) 140 MG capsule Take 1 capsule (140 mg total) by mouth daily. May take on an empty stomach to decrease nausea & vomiting. 5 capsule 0   No current facility-administered  medications on file prior to visit.    Allergies:  Allergies  Allergen Reactions   Penicillins Rash   Past Medical History:  Past Medical History:  Diagnosis Date   Hypertension    Past Surgical History:  Past Surgical History:  Procedure Laterality Date   APPLICATION OF CRANIAL NAVIGATION N/A 03/13/2021   Procedure: APPLICATION OF CRANIAL NAVIGATION;  Surgeon: Karsten Ro, DO;  Location: [facility];  Service: Neurosurgery;  Laterality: N/A;   FRAMELESS  BIOPSY WITH BRAINLAB Right 03/13/2021   Procedure: Right Parietal-Occipital craniotomy, open biopsy for brain lesion;  Surgeon: Dawley, Theodoro Doing, DO;  Location: [facility];  Service: Neurosurgery;  Laterality: Right;   Social History:  Social History   Socioeconomic History   Marital status: Married    Spouse name: Not on file   Number of children: Not on file   Years of education: Not on file   Highest education level: Not on file  Occupational History   Not on file  Tobacco Use   Smoking status: Never   Smokeless tobacco: Never  Vaping Use   Vaping Use: Never used  Substance and Sexual Activity   Alcohol use: Not Currently   Drug use: Never   Sexual activity: Not on file  Other Topics Concern   Not on file  Social History Narrative   Not on file   Social Determinants of Health   Financial Resource Strain: Not on file  Food Insecurity: Not on file  Transportation Needs: Not on file  Physical Activity: Not on file  Stress: Not on file  Social Connections: Not on file  Intimate Partner Violence: Not on file   Family History:  Family History  Problem Relation Age of Onset   Brain cancer Neg Hx     Review of Systems: Constitutional: Doesn't report fevers, chills or abnormal weight loss Eyes: Doesn't report blurriness of vision Ears, nose, mouth, throat, and face: Doesn't report sore throat Respiratory: Doesn't report cough, dyspnea or wheezes Cardiovascular: Doesn't report palpitation, chest discomfort   Gastrointestinal:  Doesn't report nausea, constipation, diarrhea GU: Doesn't report incontinence Skin: Doesn't report skin rashes Neurological: Per HPI Musculoskeletal: Doesn't report joint pain Behavioral/Psych: Doesn't report anxiety  Physical Exam: Vitals:   08/23/21 1431  BP: 117/66  Pulse: 88  Resp: 17  Temp: 99.8 F (37.7 C)  SpO2: 98%    KPS: 70. General: Alert, cooperative, pleasant, in no acute distress Head: Normal EENT: No conjunctival injection or scleral icterus.  Lungs: Resp effort normal Cardiac: Regular rate Abdomen: Non-distended abdomen Skin: No rashes cyanosis or petechiae. Extremities: No clubbing or edema  Neurologic Exam: Mental Status: Awake, alert, attentive to examiner. Oriented to self and environment. Language is fluent with intact comprehension.  Some elements of agnosia, visual spatial neglect.  Noted anterograde amnesia. Cranial Nerves: Visual acuity is grossly normal. Visual fields are full. Extra-ocular movements intact. No ptosis. Face is symmetric Motor: Tone and bulk are normal. Power is full in both arms and legs. Reflexes are symmetric, no pathologic reflexes present.  Sensory: Intact to light touch Gait: Normal.   Labs: I have reviewed the data as  listed    Component Value Date/Time   NA 141 08/16/2021 1212   K 3.5 08/16/2021 1212   CL 106 08/16/2021 1212   CO2 26 08/16/2021 1212   GLUCOSE 129 (H) 08/16/2021 1212   BUN 14 08/16/2021 1212   CREATININE 0.98 08/16/2021 1212   CALCIUM 9.4 08/16/2021 1212   PROT 6.7 08/16/2021 1212   ALBUMIN 3.5 08/16/2021 1212   AST 14 (L) 08/16/2021 1212   ALT 8 08/16/2021 1212   ALKPHOS 65 08/16/2021 1212   BILITOT 0.4 08/16/2021 1212   GFRNONAA >60 08/16/2021 1212   Lab Results  Component Value Date   WBC 2.5 (L) 08/23/2021   NEUTROABS 1.2 (L) 08/23/2021   HGB 9.9 (L) 08/23/2021   HCT 28.7 (L) 08/23/2021   MCV 95.3 08/23/2021   PLT 388 08/23/2021     Assessment/Plan High grade  glioma not classifiable by WHO criteria ([place])  Seizure ([place])  Alonni Heimsoth is clinically stable today now having completed cycle #2 of adjuvant 5-day Temodar.  Labs demonstrate improvement in neutropenia, although leukopenia and anemia persist in milder form from the Temodar.  We recommended continuing treatment with cycle #3 Temozolomide, dose reduced to 150 mg/m2, on for five days and off for twenty three days in twenty eight day cycles. The patient will have a complete blood count performed on days 21 and 28 of each cycle, and a comprehensive metabolic panel performed on day 28 of each cycle. Labs may need to be performed more often. Zofran will prescribed for home use for nausea/vomiting.   Cycle will begin on 09/03/21 to allow further bone marrow recovery.  Chemotherapy should be held for the following:  ANC less than 1,000  Platelets less than 100,000  LFT or creatinine greater than 2x ULN  If clinical concerns/contraindications develop  Chemotherapy should be held for the following:  ANC less than 1,000  Platelets less than 100,000  LFT or creatinine greater than 2x ULN  If clinical concerns/contraindications develop  For seizures, she will continue Keppra 1093m BID as prior.  We ask that SNola Botkinsreturn to clinic in 1 month for evaluation, prior to cycle #4.    All questions were answered. The patient knows to call the clinic with any problems, questions or concerns. No barriers to learning were detected.  The total time spent in the encounter was 30 minutes and more than 50% was on counseling and review of test results   ZVentura Sellers MD Medical Director of Neuro-Oncology [facility]at W[facility] [place]12/22/22 2:33 PM

## 2021-08-24 ENCOUNTER — Telehealth: Payer: Self-pay | Admitting: Internal Medicine

## 2021-08-24 NOTE — Telephone Encounter (Signed)
Scheduled per 12/22 los, message has been left with pt

## 2021-08-28 ENCOUNTER — Other Ambulatory Visit (HOSPITAL_COMMUNITY): Payer: Self-pay

## 2021-08-29 ENCOUNTER — Other Ambulatory Visit (HOSPITAL_COMMUNITY): Payer: Self-pay

## 2021-09-07 ENCOUNTER — Other Ambulatory Visit (HOSPITAL_COMMUNITY): Payer: Self-pay

## 2021-09-17 ENCOUNTER — Encounter: Payer: Self-pay | Admitting: Medical

## 2021-09-17 ENCOUNTER — Ambulatory Visit (INDEPENDENT_AMBULATORY_CARE_PROVIDER_SITE_OTHER): Payer: Medicare Other | Admitting: Medical

## 2021-09-17 ENCOUNTER — Other Ambulatory Visit (HOSPITAL_COMMUNITY): Payer: Self-pay

## 2021-09-17 VITALS — BP 100/60 | HR 85 | Temp 97.7°F | Resp 16 | Ht 61.0 in | Wt 126.0 lb

## 2021-09-17 DIAGNOSIS — D649 Anemia, unspecified: Secondary | ICD-10-CM

## 2021-09-17 DIAGNOSIS — E785 Hyperlipidemia, unspecified: Secondary | ICD-10-CM | POA: Diagnosis not present

## 2021-09-17 DIAGNOSIS — C712 Malignant neoplasm of temporal lobe: Secondary | ICD-10-CM | POA: Diagnosis not present

## 2021-09-17 DIAGNOSIS — I10 Essential (primary) hypertension: Secondary | ICD-10-CM

## 2021-09-17 DIAGNOSIS — K219 Gastro-esophageal reflux disease without esophagitis: Secondary | ICD-10-CM

## 2021-09-17 NOTE — Patient Instructions (Addendum)
Nice to meet you today.  For glioma continue to follow-up with neuro oncologist Dr. Mickeal Skinner.   Hypertension-blood pressure is very tightly controlled today.  On second check of blood pressure at 100/60.  It was higher when my medical assistant check blood pressure.  This is her first time here and I am hesitant to make blood pressure changes but they want you to get a machine over-the-counter and check your blood pressure daily.  MyChart me update by Thursday afternoon or Friday morning.  Might make dose adjustment changes to your current med regimen.  Anemia-we will check CBC and iron level today.  Appears oncologist has been repeating CBCs on a regular basis.  We will send him results on today's labs.  Mild hyperlipidemia per your description.  You have concerns about high cholesterol and will get lipid panel today.  In light of your overall medical history would not recommend aggressive treatment of cholesterol.  Consider benefit versus risk of medication as well as impact on quality of life.  Follow-up date to be determined after lab review.

## 2021-09-17 NOTE — Progress Notes (Signed)
Subjective:    Patient ID: Andrea [name], female    DOB: 19-Oct-1945, 76 y.o.   MRN: [id number]  HPI  Pt here for first time with daughter.  Pt from [place]. First time here. Mild exercise but limited due to med history. Eating healthy. Former smoker. Stopped in 2016. No alcohol use. Married    Has hx of high grade Glioma. [facility] Dr[facility]  [facility]il [facility]. Pt saw him on 08/16/21. Will see them again on 10/01/2021.    Pt has concern about her cholesterol. Mild high cholesterol in past. Pt states former used statin and made her dizzy.  On review she does have some mild-moderate anemia. Cbc followed by oncologist.  Lendell Caprice- pt is on lotensin 10/12.5 twice a day. Bp tightly controlled and not light headedness on standing.   Review of Systems  Constitutional:  Negative for chills, fatigue and fever.  Respiratory:  Negative for cough, chest tightness, shortness of breath and wheezing.   Cardiovascular:  Negative for chest pain and palpitations.  Gastrointestinal:  Negative for abdominal pain, diarrhea and vomiting.  Musculoskeletal:  Negative for back pain.  Skin:  Negative for rash and wound.  Neurological:  Negative for dizziness, weakness, numbness and headaches.  Hematological:  Negative for adenopathy. Does not bruise/bleed easily.  Psychiatric/Behavioral:  Negative for confusion, dysphoric mood and hallucinations. The patient is not nervous/anxious.     Past Medical History:  Diagnosis Date   Brain cancer ([place])    Hypertension      Social History   Socioeconomic History   Marital status: Married    Spouse name: Not on file   Number of children: Not on file   Years of education: Not on file   Highest education level: Not on file  Occupational History   Not on file  Tobacco Use   Smoking status: Former    Types: Cigarettes    Quit date: 2016    Years since quitting: 7.0   Smokeless tobacco: Never  Vaping Use   Vaping Use: Never used  Substance and  Sexual Activity   Alcohol use: Not Currently   Drug use: Never   Sexual activity: Not Currently  Other Topics Concern   Not on file  Social History Narrative   Not on file   Social Determinants of Health   Financial Resource Strain: Not on file  Food Insecurity: Not on file  Transportation Needs: Not on file  Physical Activity: Not on file  Stress: Not on file  Social Connections: Not on file  Intimate Partner Violence: Not on file    Past Surgical History:  Procedure Laterality Date   APPLICATION OF CRANIAL NAVIGATION N/A 03/13/2021   Procedure: APPLICATION OF CRANIAL NAVIGATION;  Surgeon: Dawley, Theodoro Doing, DO;  Location: [facility];  Service: Neurosurgery;  Laterality: N/A;   FRAMELESS  BIOPSY WITH BRAINLAB Right 03/13/2021   Procedure: Right Parietal-Occipital craniotomy, open biopsy for brain lesion;  Surgeon: Dawley, Theodoro Doing, DO;  Location: [facility];  Service: Neurosurgery;  Laterality: Right;    Family History  Problem Relation Age of Onset   Alcohol abuse Son    Brain cancer Neg Hx     Allergies  Allergen Reactions   Penicillins Rash    Current Outpatient Medications on File Prior to Visit  Medication Sig Dispense Refill   ALPRAZolam (XANAX) 0.25 MG tablet Take 1 tablet (0.25 mg total) by mouth 3 (three) times daily as needed. 30 tablet 0   benazepril-hydrochlorthiazide (LOTENSIN HCT)  10-12.5 MG tablet Take 1 tablet by mouth 2 (two) times daily.     COVID-19 mRNA bivalent vaccine, Moderna, (MODERNA COVID-19 BIVAL BOOSTER) 50 MCG/0.5ML injection Inject into the muscle. 0.5 mL 0   levETIRAcetam (KEPPRA) 1000 MG tablet Take 1 tablet (1,000 mg total) by mouth 2 (two) times daily. 60 tablet 3   ondansetron (ZOFRAN) 8 MG tablet Take 1 tablet (8 mg total) by mouth 2 (two) times daily as needed (nausea and vomiting). May take 30-60 minutes prior to Temodar administration if nausea/vomiting occurs. 30 tablet 1   pantoprazole (PROTONIX) 40 MG tablet TAKE 1 TABLET(40 MG) BY MOUTH AT  BEDTIME 30 tablet 3   prochlorperazine (COMPAZINE) 10 MG tablet Take 1 tablet (10 mg total) by mouth every 6 (six) hours as needed for nausea or vomiting. 30 tablet 0   temozolomide (TEMODAR) 100 MG capsule Take 1 capsule (100 mg total) by mouth daily. May take on an empty stomach to decrease nausea & vomiting. 5 capsule 0   temozolomide (TEMODAR) 140 MG capsule Take 1 capsule (140 mg total) by mouth daily. May take on an empty stomach to decrease nausea & vomiting. 5 capsule 0   No current facility-administered medications on file prior to visit.    BP 113/68 (BP Location: Right Arm, Patient Position: Sitting, Cuff Size: Small)    Pulse 85    Temp 97.7 F (36.5 C) (Oral)    Resp 16    Ht 5\' 1"  (1.549 m)    Wt 126 lb (57.2 kg)    LMP  (LMP Unknown)    SpO2 98%    BMI 23.81 kg/m       Objective:   Physical Exam  General Mental Status- Alert. General Appearance- Not in acute distress.   Skin General: Color- Normal Color. Moisture- Normal Moisture.  Neck Carotid Arteries- Normal color. Moisture- Normal Moisture. No carotid bruits. No JVD.  Chest and Lung Exam Auscultation: Breath Sounds:-Normal.  Cardiovascular Auscultation:Rythm- Regular. Murmurs & Other Heart Sounds:Auscultation of the heart reveals- No Murmurs.  Abdomen Inspection:-Inspeection Normal. Palpation/Percussion:Note:No mass. Palpation and Percussion of the abdomen reveal- Non Tender, Non Distended + BS, no rebound or guarding.   Neurologic Cranial Nerve exam:- CN III-XII intact(No nystagmus), symmetric smile. Strength:- 5/5 equal and symmetric strength both upper and lower extremities.       Assessment & Plan:   Patient Instructions  Nice to meet you today.  For glioma continue to follow-up with neuro oncologist Dr. Mickeal Skinner.   Hypertension-blood pressure is very tightly controlled today.  On second check of blood pressure at 100/60.  It was higher when my medical assistant check blood pressure.  This is  her first time here and I am hesitant to make blood pressure changes but they want you to get a machine over-the-counter and check your blood pressure daily.  MyChart me update by Thursday afternoon or Friday morning.  Might make dose adjustment changes to your current med regimen.  Anemia-we will check CBC and iron level today.  Appears oncologist has been repeating CBCs on a regular basis.  We will send him results on today's labs.  Mild hyperlipidemia per your description.  You have concerns about high cholesterol and will get lipid panel today.  In light of your overall medical history would not recommend aggressive treatment of cholesterol.  Consider benefit versus risk of medication as well as impact on quality of life.  Follow-up date to be determined after lab review.  Mackie Pai, PA-C

## 2021-09-18 LAB — CBC WITH DIFFERENTIAL/PLATELET
Basophils Absolute: 0.1 10*3/uL (ref 0.0–0.1)
Basophils Relative: 0.9 % (ref 0.0–3.0)
Eosinophils Absolute: 0.2 10*3/uL (ref 0.0–0.7)
Eosinophils Relative: 2.3 % (ref 0.0–5.0)
HCT: 32.7 % — ABNORMAL LOW (ref 36.0–46.0)
Hemoglobin: 11 g/dL — ABNORMAL LOW (ref 12.0–15.0)
Lymphocytes Relative: 11.2 % — ABNORMAL LOW (ref 12.0–46.0)
Lymphs Abs: 0.7 10*3/uL (ref 0.7–4.0)
MCHC: 33.6 g/dL (ref 30.0–36.0)
MCV: 101.7 fl — ABNORMAL HIGH (ref 78.0–100.0)
Monocytes Absolute: 0.6 10*3/uL (ref 0.1–1.0)
Monocytes Relative: 8.9 % (ref 3.0–12.0)
Neutro Abs: 5 10*3/uL (ref 1.4–7.7)
Neutrophils Relative %: 76.7 % (ref 43.0–77.0)
Platelets: 373 10*3/uL (ref 150.0–400.0)
RBC: 3.22 Mil/uL — ABNORMAL LOW (ref 3.87–5.11)
RDW: 18.3 % — ABNORMAL HIGH (ref 11.5–15.5)
WBC: 6.6 10*3/uL (ref 4.0–10.5)

## 2021-09-18 LAB — LIPID PANEL
Cholesterol: 237 mg/dL — ABNORMAL HIGH (ref 0–200)
HDL: 31.4 mg/dL — ABNORMAL LOW (ref >39.00)
NonHDL: 205.44
Total CHOL/HDL Ratio: 8
Triglycerides: 267 mg/dL — ABNORMAL HIGH (ref 0.0–149.0)
VLDL: 53.4 mg/dL — ABNORMAL HIGH (ref 0.0–40.0)

## 2021-09-18 LAB — IRON: Iron: 93 ug/dL (ref 42–145)

## 2021-09-18 LAB — LDL CHOLESTEROL, DIRECT: Direct LDL: 145 mg/dL

## 2021-09-20 ENCOUNTER — Other Ambulatory Visit: Payer: Self-pay | Admitting: *Deleted

## 2021-09-20 NOTE — Progress Notes (Signed)
Patients daughter called asking if it was ok to have dental cleanings and if frequency was a concern.  She is going to have dental cleaning done by Dr Harrington Challenger with [facility].  Faxed letter to [phone]

## 2021-09-28 ENCOUNTER — Other Ambulatory Visit (HOSPITAL_COMMUNITY): Payer: Self-pay

## 2021-10-01 ENCOUNTER — Inpatient Hospital Stay: Payer: Medicare Other | Attending: Neurological Surgery | Admitting: Internal Medicine

## 2021-10-01 ENCOUNTER — Other Ambulatory Visit (HOSPITAL_COMMUNITY): Payer: Self-pay

## 2021-10-01 ENCOUNTER — Other Ambulatory Visit: Payer: Self-pay

## 2021-10-01 VITALS — BP 109/62 | HR 70 | Temp 97.0°F | Resp 17 | Wt 124.2 lb

## 2021-10-01 DIAGNOSIS — R569 Unspecified convulsions: Secondary | ICD-10-CM | POA: Diagnosis not present

## 2021-10-01 DIAGNOSIS — C719 Malignant neoplasm of brain, unspecified: Secondary | ICD-10-CM

## 2021-10-01 DIAGNOSIS — C718 Malignant neoplasm of overlapping sites of brain: Secondary | ICD-10-CM | POA: Diagnosis not present

## 2021-10-01 DIAGNOSIS — Z79899 Other long term (current) drug therapy: Secondary | ICD-10-CM | POA: Diagnosis not present

## 2021-10-01 MED ORDER — DEXAMETHASONE 4 MG PO TABS: 4.0000 mg | ORAL_TABLET | Freq: Every day | ORAL | 1 refills | Status: DC

## 2021-10-01 MED ORDER — GABAPENTIN 100 MG PO CAPS: 300.0000 mg | ORAL_CAPSULE | Freq: Two times a day (BID) | ORAL | 1 refills | Status: DC
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
## 2021-10-02 ENCOUNTER — Other Ambulatory Visit (HOSPITAL_COMMUNITY): Payer: Self-pay

## 2021-10-02 ENCOUNTER — Telehealth: Payer: Self-pay | Admitting: Internal Medicine

## 2021-10-02 NOTE — Telephone Encounter (Signed)
Scheduled per 1/30 los, pt has been called and husband confirmed appt

## 2021-10-03 ENCOUNTER — Other Ambulatory Visit (HOSPITAL_COMMUNITY): Payer: Self-pay

## 2021-10-04 ENCOUNTER — Other Ambulatory Visit: Payer: Self-pay | Admitting: Radiation Therapy

## 2021-10-06 ENCOUNTER — Ambulatory Visit (HOSPITAL_COMMUNITY)
Admission: RE | Admit: 2021-10-06 | Discharge: 2021-10-06 | Disposition: A | Discharge status: Home / Self Care | Payer: Medicare Other | Source: Ambulatory Visit | Attending: Internal Medicine | Admitting: Internal Medicine

## 2021-10-06 DIAGNOSIS — C719 Malignant neoplasm of brain, unspecified: Secondary | ICD-10-CM | POA: Diagnosis not present

## 2021-10-06 DIAGNOSIS — G9389 Other specified disorders of brain: Secondary | ICD-10-CM | POA: Diagnosis not present

## 2021-10-06 IMAGING — MR MR HEAD WO/W CM
15 series · 48 of 48 positions shown · IV contrast (gadavist)
Comparison: [DATE]

CLINICAL DATA: Brain/CNS neoplasm, assess treatment response;
high-grade glioma

EXAM:
MRI HEAD WITHOUT AND WITH CONTRAST
TECHNIQUE: Multiplanar, multiecho pulse sequences of the brain and surrounding
structures were obtained without and with intravenous contrast.
CONTRAST:  6mL GADAVIST GADOBUTROL 1 MMOL/ML IV SOLN

[Series 5: DWI · axial · 3.0mm · 1.36mm/px · z∈[-71,+70]mm · 6 of 96 slices shown (1 of 2)]
[im 1/96]
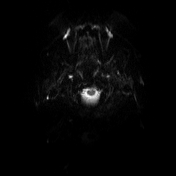
[im 20/96]
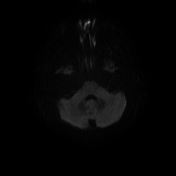
[im 39/96]
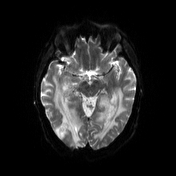
[im 58/96]
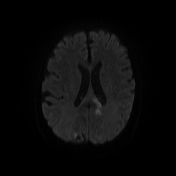
[im 77/96]
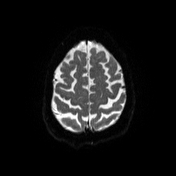
[im 96/96]
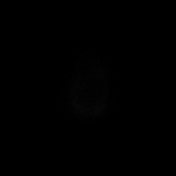

[Series 6: DWI · axial · 3.0mm · 1.36mm/px · z∈[-71,+70]mm · 3 of 48 slices shown (2 of 2)]
[im 1/48]
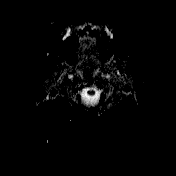
[im 24/48]
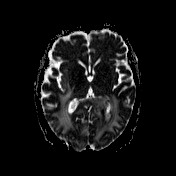
[im 48/48]
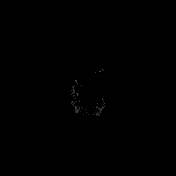

[Series 7: T1 · sagittal · 5.0mm · 0.75mm/px · 1 of 24 slices shown (1 of 4)]
[im 1/24]
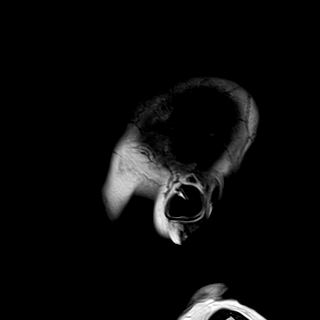

[Series 8: T2 · axial · 5.0mm · 0.62mm/px · 1 of 24 slices shown]
[im 1/24]
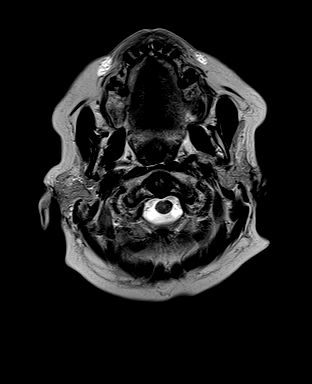

[Series 9: swi_images · axial · 3.0mm · 0.75mm/px · z∈[-106,+106]mm · 4 of 72 slices shown]
[im 1/72]
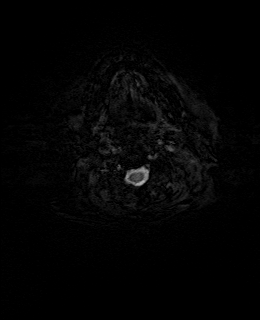
[im 24/72]
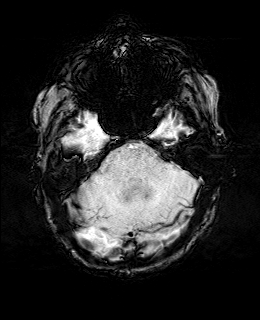
[im 48/72]
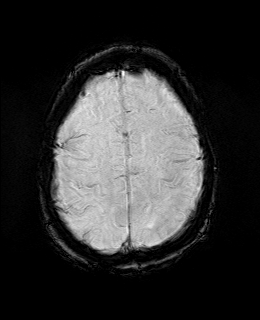
[im 72/72]
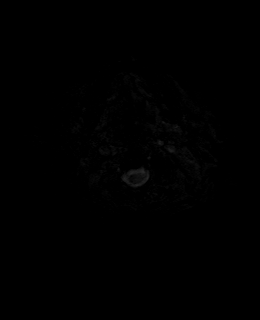

[Series 11: FLAIR · axial · 3.0mm · 0.75mm/px · z∈[-77,+76]mm · 3 of 52 slices shown]
[im 1/52]
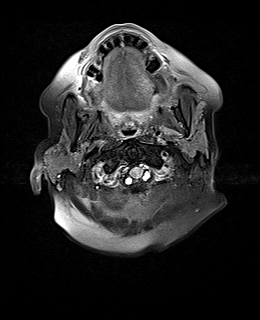
[im 26/52]
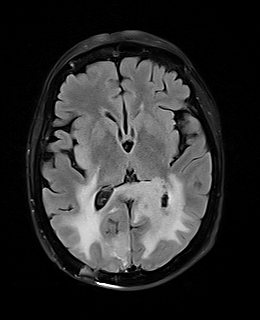
[im 52/52]
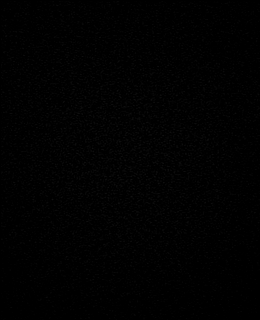

[Series 12: T1 · axial · 1.0mm · 0.94mm/px · z∈[-72,+71]mm · 8 of 144 slices shown (2 of 4)]
[im 1/144]
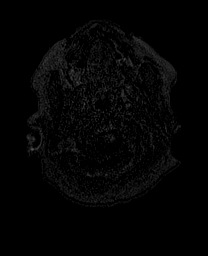
[im 21/144]
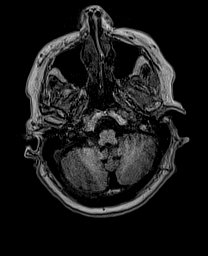
[im 41/144]
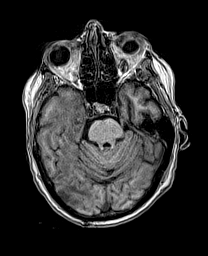
[im 62/144]
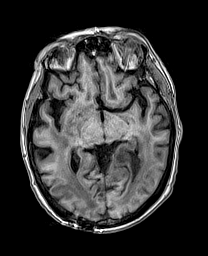
[im 82/144]
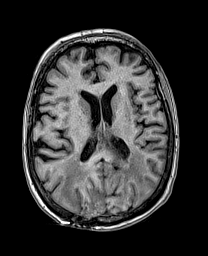
[im 103/144]
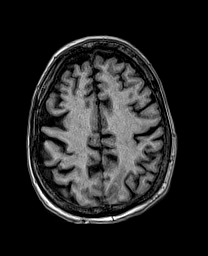
[im 123/144]
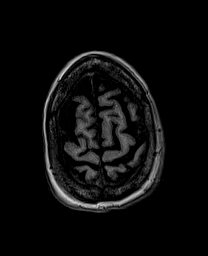
[im 144/144]
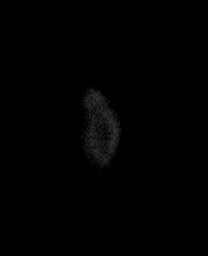

[Series 13: cor dwi_tracew · coronal · 5.0mm · 1.53mm/px · 3 of 56 slices shown]
[im 1/56]
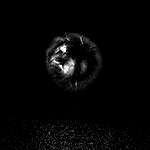
[im 28/56]
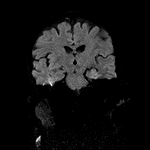
[im 56/56]
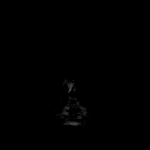

[Series 14: cor dwi_adc · coronal · 5.0mm · 1.53mm/px · 2 of 28 slices shown]
[im 1/28]
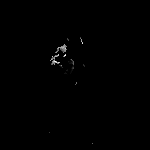
[im 28/28]
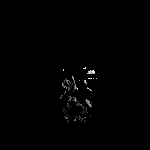

[Series 15: T1 post-contrast · axial · 1.0mm · 0.94mm/px · z∈[-72,+71]mm · 8 of 144 slices shown (1 of 3)]
[im 1/144]
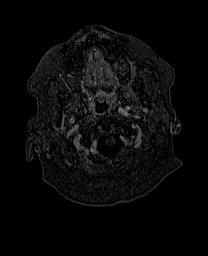
[im 21/144]
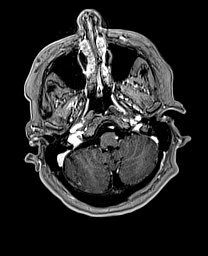
[im 41/144]
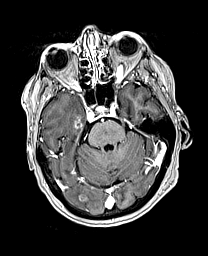
[im 62/144]
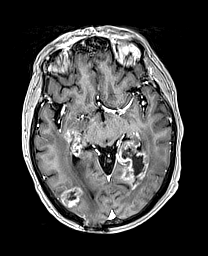
[im 82/144]
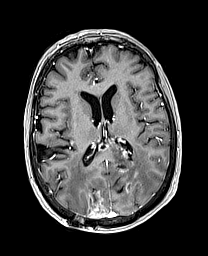
[im 103/144]
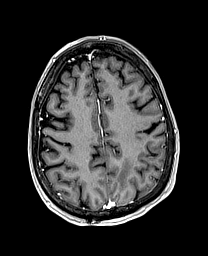
[im 123/144]
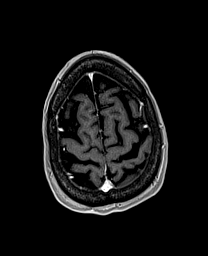
[im 144/144]
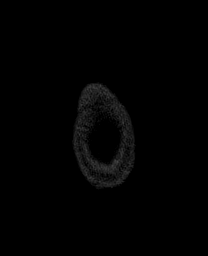

[Series 16: T1 · sagittal · 4.0mm · 0.94mm/px · 2 of 30 slices shown (3 of 4)]
[im 1/30]
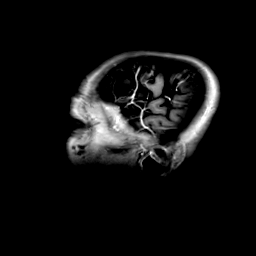
[im 30/30]
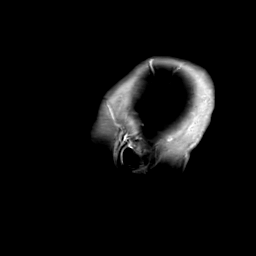

[Series 17: T1 · coronal · 4.0mm · 0.94mm/px · 2 of 30 slices shown (4 of 4)]
[im 1/30]
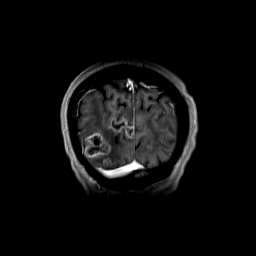
[im 30/30]
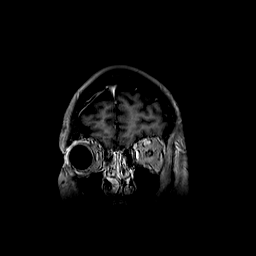

[Series 18: T2 post-contrast · coronal · 5.0mm · 0.57mm/px · 2 of 28 slices shown]
[im 1/28]
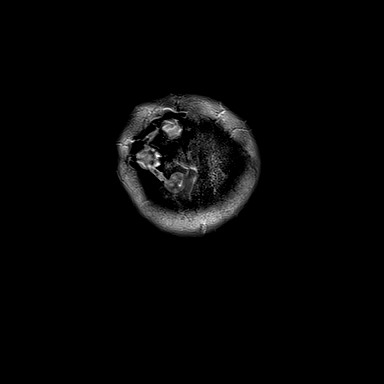
[im 28/28]
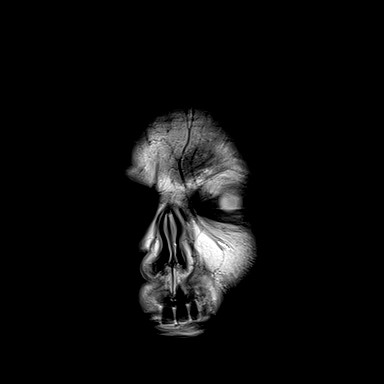

[Series 19: T1 post-contrast · coronal · 5.0mm · 0.43mm/px · 2 of 28 slices shown (2 of 3)]
[im 1/28]
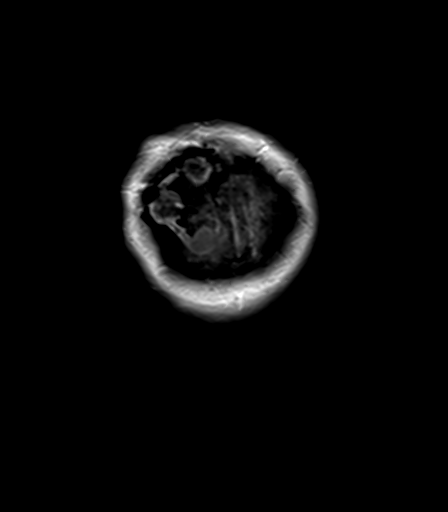
[im 28/28]
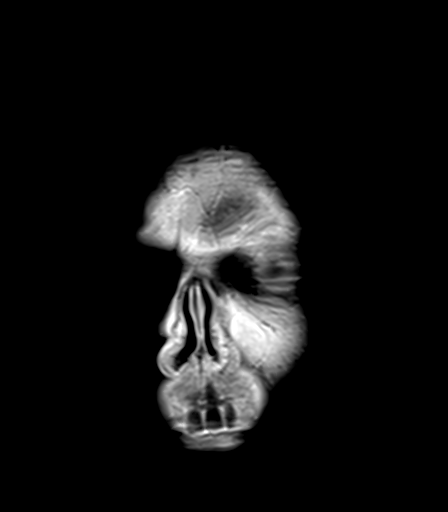

[Series 20: T1 post-contrast · sagittal · 5.0mm · 0.75mm/px · 1 of 24 slices shown (3 of 3)]
[im 1/24]
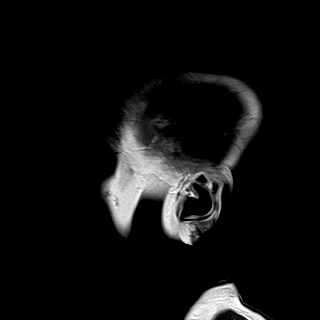

[48 of 48 positions shown; findings below may reference images not displayed]

FINDINGS: Brain: Multifocal areas of irregular enhancement are again
identified with involvement of right temporal, occipital, and
parietal lobes and left temporal and occipital lobes. There is been
some progression compared to the prior study. For example, right
occipitotemporal lesion measures 2.3 x 2.2 cm (previously 1.3 x
cm) on series 15, image 53. Lesion about the atrium of the left
lateral ventricle including the ependymal margin and also involving
the posterior left hippocampus is difficult to measure but has
clearly increased in size visually. Similarly, enhancement is
clearly increased along the right temporal horn and medial temporal
lobe. Extent of T2 FLAIR hyperintensity has also increased. As
before, there is involvement of the splenium of the corpus callosum
where there is also patchy irregular enhancement.

Postoperative changes are present underlying the right
parieto-occipital craniotomy.

No acute infarction. Remains some reduced diffusion in the areas of
abnormal signal likely reflecting hypercellularity. Ventricles are
stable in size without hydrocephalus. No significant change in mass
effect.

Vascular: Major vessel flow voids at the skull base are preserved.

Skull and upper cervical spine: Normal marrow signal is preserved.

Sinuses/Orbits: Paranasal sinuses are aerated. Orbits are
unremarkable.

Other: Sella is unremarkable.  Mastoid air cells are clear.
IMPRESSION: Progression in enhancement and extent of T2 FLAIR hyperintensity
since [DATE] as described reflecting progression versus
pseudoprogression.

## 2021-10-06 MED ORDER — GADOBUTROL 1 MMOL/ML IV SOLN
6.0000 mL | Freq: Once | INTRAVENOUS | Status: AC | PRN
  Administered 2021-10-06: 6 mL via INTRAVENOUS

## 2021-10-08 ENCOUNTER — Inpatient Hospital Stay: Payer: Medicare Other

## 2021-10-08 ENCOUNTER — Inpatient Hospital Stay: Payer: Medicare Other | Attending: Neurological Surgery | Admitting: Internal Medicine

## 2021-10-08 ENCOUNTER — Other Ambulatory Visit (HOSPITAL_COMMUNITY): Payer: Self-pay

## 2021-10-08 DIAGNOSIS — C718 Malignant neoplasm of overlapping sites of brain: Secondary | ICD-10-CM | POA: Insufficient documentation

## 2021-10-08 DIAGNOSIS — C719 Malignant neoplasm of brain, unspecified: Secondary | ICD-10-CM

## 2021-10-08 DIAGNOSIS — R569 Unspecified convulsions: Secondary | ICD-10-CM

## 2021-10-08 MED ORDER — GABAPENTIN 100 MG PO CAPS: 100.0000 mg | ORAL_CAPSULE | Freq: Two times a day (BID) | ORAL | 1 refills | Status: DC

## 2021-10-08 MED ORDER — TEMOZOLOMIDE 100 MG PO CAPS
100.0000 mg | ORAL_CAPSULE | Freq: Every day | ORAL | 0 refills | Status: AC
  Filled 2021-10-08: qty 5, 28d supply, fill #0

## 2021-10-08 MED ORDER — DEXAMETHASONE 4 MG PO TABS
4.0000 mg | ORAL_TABLET | Freq: Two times a day (BID) | ORAL | 1 refills | Status: DC
  Filled 2021-10-08: qty 60, 30d supply, fill #0

## 2021-10-08 MED ORDER — TEMOZOLOMIDE 140 MG PO CAPS
140.0000 mg | ORAL_CAPSULE | Freq: Every day | ORAL | 0 refills | Status: AC
  Filled 2021-10-08: qty 5, 28d supply, fill #0

## 2021-10-08 NOTE — Progress Notes (Signed)
I connected with Andrea [name] on 10/08/21 at 12:00 PM EST by telephone visit and verified that I am speaking with the correct person using two identifiers.  I discussed the limitations, risks, security and privacy concerns of performing an evaluation and management service by telemedicine and the availability of in-person appointments. I also discussed with the patient that there may be a patient responsible charge related to this service. The patient expressed understanding and agreed to proceed.  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  She continues to have issues with memory, navigation, walking as prior.  Energy and appetite are somewhat improved.  No seizures or headaches, no other new symptoms.  Observations: Language and cognition at baseline  Imaging:  [place] Clinician Interpretation: I have personally reviewed the CNS images as listed.  My interpretation, in the context of the patient's clinical presentation, is progressive disease  MR BRAIN W WO CONTRAST  Result Date: 10/06/2021 CLINICAL DATA:  Brain/CNS neoplasm, assess treatment response; high-grade glioma EXAM: MRI HEAD WITHOUT AND WITH CONTRAST TECHNIQUE: Multiplanar, multiecho pulse sequences of the brain and surrounding structures were obtained without and with intravenous contrast. CONTRAST:  95mL GADAVIST GADOBUTROL 1 MMOL/ML IV SOLN COMPARISON:  08/10/2021 FINDINGS: Brain: Multifocal areas of irregular enhancement are again identified with involvement of right temporal,  occipital, and parietal lobes and left temporal and occipital lobes. There is been some progression compared to the prior study. For example, right occipitotemporal lesion measures 2.3 x 2.2 cm (previously 1.3 x 1.4 cm) on series 15, image 53. Lesion about the atrium of the left lateral ventricle including the ependymal margin and also involving the posterior left hippocampus is difficult to measure but has clearly increased in size visually. Similarly, enhancement is clearly increased along the right temporal horn and medial temporal lobe. Extent of T2 FLAIR hyperintensity has also increased. As before, there is involvement of the splenium of the corpus callosum where there is also patchy irregular enhancement. Postoperative changes are present underlying the right parieto-occipital craniotomy. No acute infarction. Remains some reduced diffusion in the areas of abnormal signal likely reflecting hypercellularity. Ventricles are stable in size without hydrocephalus. No significant change in mass effect. Vascular: Major vessel flow voids at the skull base are preserved. Skull and upper cervical spine: Normal marrow signal is preserved. Sinuses/Orbits: Paranasal sinuses are aerated. Orbits are unremarkable. Other: Sella is unremarkable.  Mastoid air cells are clear. IMPRESSION: Progression in enhancement and extent of T2 FLAIR hyperintensity since 08/10/2021 as described reflecting progression versus pseudoprogression. Electronically Signed   By: Macy Mis M.D.   On: 10/06/2021 14:23    Assessment and Plan:  Andrea [name] is radiographically progressive today.  Etiology is either pseudoprogression (somewhat favored given symmetric nature of changes) or organic tumor progression.  Clinically she may be modestly improved from the decadron initiated last week.  Case was discussed in CNS tumor board.  Recommended an additional cycle of Temodar 150mg /m2, then repeat MRI brain in 1 month for re-eval.    We  recommended continuing treatment with cycle #4 Temozolomide  150 mg/m2, on for five days and off for twenty three days in twenty eight day cycles. The patient will have a complete blood count performed on days 21 and 28 of each cycle, and a comprehensive metabolic panel performed on day 28 of each cycle. Labs may need to be performed more often. Zofran will prescribed for home use for nausea/vomiting.   Chemotherapy should be held for the following:  ANC less than 1,000  Platelets less than 100,000  LFT or creatinine greater than 2x ULN  If clinical concerns/contraindications develop  Decadron may be increased to 4mg  BID given ongoing symptom burden, high amount of inflammation on scan.  Patient also requested consult/eval with palliative care given quality of life concerns.  Follow Up Instructions: RTC in 4 weeks following MRI study prior to cycle #5  I discussed the assessment and treatment plan with the patient.  The patient was provided an opportunity to ask questions and all were answered.  The patient agreed with the plan and demonstrated understanding of the instructions.    The patient was advised to call back or seek an in-person evaluation if the symptoms worsen or if the condition fails to improve as anticipated.  I provided 5-10 minutes of non-face-to-face time during this enocunter.  Ventura Sellers, MD   I provided 25 minutes of non face-to-face telephone visit time during this encounter, and > 50% was spent counseling as documented under my assessment & plan.

## 2021-10-09 ENCOUNTER — Telehealth: Payer: Self-pay | Admitting: Nurse Practitioner

## 2021-10-09 NOTE — Telephone Encounter (Signed)
Scheduled per 2/7 los, attempted to call daughter per pt request but no voicemail, will mail calender

## 2021-10-10 ENCOUNTER — Other Ambulatory Visit (HOSPITAL_COMMUNITY): Payer: Self-pay

## 2021-10-11 ENCOUNTER — Other Ambulatory Visit: Payer: Self-pay | Admitting: Radiation Therapy

## 2021-10-13 ENCOUNTER — Other Ambulatory Visit (HOSPITAL_COMMUNITY): Payer: Self-pay

## 2021-10-15 ENCOUNTER — Inpatient Hospital Stay (HOSPITAL_BASED_OUTPATIENT_CLINIC_OR_DEPARTMENT_OTHER): Payer: Medicare Other | Admitting: Nurse Practitioner

## 2021-10-15 ENCOUNTER — Encounter: Payer: Self-pay | Admitting: Internal Medicine

## 2021-10-15 ENCOUNTER — Other Ambulatory Visit (HOSPITAL_COMMUNITY): Payer: Self-pay

## 2021-10-15 DIAGNOSIS — R531 Weakness: Secondary | ICD-10-CM

## 2021-10-15 DIAGNOSIS — Z7189 Other specified counseling: Secondary | ICD-10-CM

## 2021-10-15 DIAGNOSIS — C718 Malignant neoplasm of overlapping sites of brain: Secondary | ICD-10-CM

## 2021-10-15 DIAGNOSIS — Z515 Encounter for palliative care: Secondary | ICD-10-CM

## 2021-10-15 DIAGNOSIS — R630 Anorexia: Secondary | ICD-10-CM

## 2021-10-15 DIAGNOSIS — C719 Malignant neoplasm of brain, unspecified: Secondary | ICD-10-CM

## 2021-10-15 NOTE — Progress Notes (Signed)
[facility]  [phone] [phone]) [phone]   Name: Andrea [name] Date: 10/15/2021 MRN: [id number]  DOB: [birth year]  Patient Care Team: Andrea [name] as PCP - General (Internal Medicine)    I connected with Andrea [name] on 10/16/21 at  1:00 PM EST by My Chart video and verified that I am speaking with the correct person using two identifiers.   I discussed the limitations, risks, security and privacy concerns of performing an evaluation and management service by telemedicine and the availability of in-person appointments. I also discussed with the patient that there may be a patient responsible charge related to this service. The patient expressed understanding and agreed to proceed.   Other persons participating in the visit and their role in the encounter: Andrea [name] (husband)    Patients location: Home  Providers location: [place]   Chief Complaint: Initial Palliative visit for symptom management/goals of care   REASON FOR CONSULTATION: Andrea [name] is a 76 y.o. female with medical history including high grade glioma and hypertension.  She is s/p partial brain radiation and currently on Temozolmide and dexamethasone. Palliative ask to see for symptom management and goals of care.    SOCIAL HISTORY:     reports that she quit smoking about 7 years ago. Her smoking use included cigarettes. She has never used smokeless tobacco. She reports that she does not currently use alcohol. She reports that she does not use drugs.  ADVANCE DIRECTIVES:    CODE STATUS:   PAST MEDICAL HISTORY: Past Medical History:  Diagnosis Date   Brain cancer ([place])    Hypertension     PAST SURGICAL HISTORY:  Past Surgical History:  Procedure Laterality Date   APPLICATION OF CRANIAL NAVIGATION N/A 03/13/2021   Procedure: APPLICATION OF CRANIAL NAVIGATION;  Surgeon: Andrea [name], Theodoro Doing, DO;  Location: [facility];  Service: Neurosurgery;   Laterality: N/A;   FRAMELESS  BIOPSY WITH BRAINLAB Right 03/13/2021   Procedure: Right Parietal-Occipital craniotomy, open biopsy for brain lesion;  Surgeon: Andrea [name], Theodoro Doing, DO;  Location: [facility];  Service: Neurosurgery;  Laterality: Right;    HEMATOLOGY/ONCOLOGY HISTORY:  Oncology History  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 Reatha [name]; path is high grade glioma    03/29/2021 Cancer Staging   Staging form: Brain and Spinal Cord, AJCC 8th Edition - Pathologic: WHO Grade IV - Signed by Andrea Sellers, MD on 03/29/2021 Histologic grading system: 4 grade system Extent of surgical resection: Biopsy Solitary (s) or multifocal (m) tumors in the primary site: Multifocal Karnofsky performance status: Score 70 Seizures at presentation: Present Duration of symptoms before diagnosis: Short IDH1 mutation: Negative    04/23/2021 - 05/14/2021 Radiation Therapy   3 weeks IMRT and Temodar with Dr. Isidore [name]   06/18/2021 -  Chemotherapy   Patient is on Treatment Plan : BRAIN GLIOBLASTOMA Consolidation Temozolomide Days 1-5 q28 Days        ALLERGIES:  is allergic to penicillins.  MEDICATIONS:  Current Outpatient Medications  Medication Sig Dispense Refill   ALPRAZolam (XANAX) 0.25 MG tablet Take 1 tablet (0.25 mg total) by mouth 3 (three) times daily as needed. 30 tablet 0   benazepril-hydrochlorthiazide (LOTENSIN HCT) 10-12.5 MG tablet Take 1 tablet by mouth 2 (two) times daily.     COVID-19 mRNA bivalent vaccine, Moderna, (MODERNA COVID-19 BIVAL BOOSTER) 50 MCG/0.5ML injection Inject into the muscle. 0.5 mL 0  dexamethasone (DECADRON) 4 MG tablet Take 1 tablet (4 mg total) by mouth 2 (two) times daily. 60 tablet 1   gabapentin (NEURONTIN) 100 MG capsule Take 1 capsule (100 mg total) by mouth 2 (two) times daily. 60 capsule 1   levETIRAcetam (KEPPRA) 1000 MG tablet Take 1 tablet (1,000 mg total) by mouth 2 (two) times daily. 60 tablet 3    ondansetron (ZOFRAN) 8 MG tablet Take 1 tablet (8 mg total) by mouth 2 (two) times daily as needed (nausea and vomiting). May take 30-60 minutes prior to Temodar administration if nausea/vomiting occurs. 30 tablet 1   pantoprazole (PROTONIX) 40 MG tablet TAKE 1 TABLET(40 MG) BY MOUTH AT BEDTIME 30 tablet 3   prochlorperazine (COMPAZINE) 10 MG tablet Take 1 tablet (10 mg total) by mouth every 6 (six) hours as needed for nausea or vomiting. 30 tablet 0   temozolomide (TEMODAR) 100 MG capsule Take 1 capsule (100 mg total) by mouth daily. May take on an empty stomach to decrease nausea & vomiting. 5 capsule 0   temozolomide (TEMODAR) 140 MG capsule Take 1 capsule (140 mg total) by mouth daily. May take on an empty stomach to decrease nausea & vomiting. 5 capsule 0   No current facility-administered medications for this visit.    VITAL SIGNS: LMP  (LMP Unknown)  There were no vitals filed for this visit.  Estimated body mass index is 23.47 kg/m as calculated from the following:   Height as of 09/17/21: $RemoveBef'5\' 1"'mQdCAzLvJH$  (1.549 m).   Weight as of 10/01/21: 124 lb 3 oz (56.3 kg).   RADIOGRAPHIC STUDIES: MR BRAIN W WO CONTRAST  Result Date: 10/06/2021 CLINICAL DATA:  Brain/CNS neoplasm, assess treatment response; high-grade glioma EXAM: MRI HEAD WITHOUT AND WITH CONTRAST TECHNIQUE: Multiplanar, multiecho pulse sequences of the brain and surrounding structures were obtained without and with intravenous contrast. CONTRAST:  18mL GADAVIST GADOBUTROL 1 MMOL/ML IV SOLN COMPARISON:  08/10/2021 FINDINGS: Brain: Multifocal areas of irregular enhancement are again identified with involvement of right temporal, occipital, and parietal lobes and left temporal and occipital lobes. There is been some progression compared to the prior study. For example, right occipitotemporal lesion measures 2.3 x 2.2 cm (previously 1.3 x 1.4 cm) on series 15, image 53. Lesion about the atrium of the left lateral ventricle including the ependymal  margin and also involving the posterior left hippocampus is difficult to measure but has clearly increased in size visually. Similarly, enhancement is clearly increased along the right temporal horn and medial temporal lobe. Extent of T2 FLAIR hyperintensity has also increased. As before, there is involvement of the splenium of the corpus callosum where there is also patchy irregular enhancement. Postoperative changes are present underlying the right parieto-occipital craniotomy. No acute infarction. Remains some reduced diffusion in the areas of abnormal signal likely reflecting hypercellularity. Ventricles are stable in size without hydrocephalus. No significant change in mass effect. Vascular: Major vessel flow voids at the skull base are preserved. Skull and upper cervical spine: Normal marrow signal is preserved. Sinuses/Orbits: Paranasal sinuses are aerated. Orbits are unremarkable. Other: Sella is unremarkable.  Mastoid air cells are clear. IMPRESSION: Progression in enhancement and extent of T2 FLAIR hyperintensity since 08/10/2021 as described reflecting progression versus pseudoprogression. Electronically Signed   By: Macy Mis M.D.   On: 10/06/2021 14:23    PERFORMANCE STATUS (ECOG) : 2 - Symptomatic, <50% confined to bed  Review of Systems  Neurological:  Positive for weakness.       Short-term memory  deficit   Unless otherwise noted, a complete review of systems is negative.  Physical Exam General: NAD  IMPRESSION:  This is my initial visit with Andrea [name] and her husband.   I introduced myself, Advertising copywriter, and Palliative's role in collaboration with the oncology team. Concept of Palliative Care was introduced as specialized medical care for people and their families living with serious illness.  It focuses on providing relief from the symptoms and stress of a serious illness.  The goal is to improve quality of life for both the patient and the family. Values and goals of care  important to patient and family were attempted to be elicited.   Andrea [name] lives in the home with her husband of more than 43 years. They had 2 children (daughter lives in [place] and unfortunately their son passed away several years ago). She is a retired Geophysical data processor for [employer]. She enjoyed any kind of needlework (cross stitching, knitting, and quilting).  Patient has short-term memory deficit. Her husband engaged in main discussions. Patient will repeat same statements or questions several times. Is ambulatory in the home with stand by assistance and occasional use of walker or cane. Husband shares due to increase weakness and some instability she requires more assistance to prevent falls. Her vision has worsened. Requires assistance with ADLs.   Denies nausea, constipation, diarrhea, or pain at this time. Her appetite had decreased but is much improved since starting on dexamethasone. Husband reports she is now snacking and eating meals similar to baseline.   We discussed Her current illness and what it means in the larger context of Her on-going co-morbidities. Natural disease trajectory and expectations were discussed.  We discussed at length patient's ongoing decline and changes. Andrea [name] is providing care for her 24/7. His daughter-in-law is involved and has offered to come over to support him and allow him some self-care time. He shares patient continues to refuse for anyone to care or support her except for him. He tries to allow her some normalcy as much as possible. We discussed allowing family and friends to come visit and he remain in the home for her comfort, however with family occupying and caring for her during their time there. Hopefully this will allow him time to slide out for some alone time and get other things done that he may not would be able to do alone.   He has tried to find things in the home to occupy her time. Unfortunately due to memory and poor vision she has been  unable to do any of the things she once enjoyed. He also has tried adult Architectural technologist.   We spent most of our visit  establishing a therapeutic relationship and gaining understanding of expectations regarding support and disease trajectory.   I discussed the importance of continued conversation with family and their medical providers regarding overall plan of care and treatment options, ensuring decisions are within the context of the patients values and GOCs.  PLAN: Appetite is much improved. Continue on dexamethasone. No other symptom needs at this time.  Discussed ongoing support and goals of care discussions. Education provided on home activities and self-care.  I will plan to see patient back in 2-4 weeks in collaboration to other oncology appointments.    Patient expressed understanding and was in agreement with this plan. She also understands that She can call the clinic at any time with any questions, concerns, or complaints.   Time Total: 65 min   Visit  consisted of counseling and education dealing with the complex and emotionally intense issues of symptom management and palliative care in the setting of serious and potentially life-threatening illness.Greater than 50%  of this time was spent counseling and coordinating care related to the above assessment and plan.  Signed by: Alda Lea, AGPCNP-BC Palliative Medicine Team

## 2021-10-16 ENCOUNTER — Encounter: Payer: Self-pay | Admitting: Internal Medicine

## 2021-10-25 ENCOUNTER — Other Ambulatory Visit: Payer: Self-pay | Admitting: Nurse Practitioner

## 2021-10-25 DIAGNOSIS — R530 Neoplastic (malignant) related fatigue: Secondary | ICD-10-CM

## 2021-10-25 DIAGNOSIS — C719 Malignant neoplasm of brain, unspecified: Secondary | ICD-10-CM

## 2021-10-25 DIAGNOSIS — R531 Weakness: Secondary | ICD-10-CM

## 2021-10-25 DIAGNOSIS — C712 Malignant neoplasm of temporal lobe: Secondary | ICD-10-CM

## 2021-10-25 NOTE — Progress Notes (Signed)
Ordered light weight wheelchair with [place] through [facility] online.

## 2021-10-28 DIAGNOSIS — I10 Essential (primary) hypertension: Secondary | ICD-10-CM | POA: Diagnosis not present

## 2021-10-28 DIAGNOSIS — R530 Neoplastic (malignant) related fatigue: Secondary | ICD-10-CM | POA: Diagnosis not present

## 2021-10-28 DIAGNOSIS — C712 Malignant neoplasm of temporal lobe: Secondary | ICD-10-CM | POA: Diagnosis not present

## 2021-10-28 DIAGNOSIS — Z87891 Personal history of nicotine dependence: Secondary | ICD-10-CM | POA: Diagnosis not present

## 2021-10-28 DIAGNOSIS — Z7952 Long term (current) use of systemic steroids: Secondary | ICD-10-CM | POA: Diagnosis not present

## 2021-10-28 DIAGNOSIS — Z9181 History of falling: Secondary | ICD-10-CM | POA: Diagnosis not present

## 2021-10-29 DIAGNOSIS — Z7952 Long term (current) use of systemic steroids: Secondary | ICD-10-CM | POA: Diagnosis not present

## 2021-10-29 DIAGNOSIS — I10 Essential (primary) hypertension: Secondary | ICD-10-CM | POA: Diagnosis not present

## 2021-10-29 DIAGNOSIS — R530 Neoplastic (malignant) related fatigue: Secondary | ICD-10-CM | POA: Diagnosis not present

## 2021-10-29 DIAGNOSIS — C712 Malignant neoplasm of temporal lobe: Secondary | ICD-10-CM | POA: Diagnosis not present

## 2021-10-29 DIAGNOSIS — Z87891 Personal history of nicotine dependence: Secondary | ICD-10-CM | POA: Diagnosis not present

## 2021-10-29 DIAGNOSIS — Z9181 History of falling: Secondary | ICD-10-CM | POA: Diagnosis not present

## 2021-11-02 ENCOUNTER — Ambulatory Visit (HOSPITAL_COMMUNITY)
Admission: RE | Admit: 2021-11-02 | Discharge: 2021-11-02 | Disposition: A | Discharge status: Home / Self Care | Payer: Medicare Other | Source: Ambulatory Visit | Attending: Internal Medicine | Admitting: Internal Medicine

## 2021-11-02 ENCOUNTER — Other Ambulatory Visit: Payer: Self-pay

## 2021-11-02 DIAGNOSIS — Z7952 Long term (current) use of systemic steroids: Secondary | ICD-10-CM | POA: Diagnosis not present

## 2021-11-02 DIAGNOSIS — G9389 Other specified disorders of brain: Secondary | ICD-10-CM | POA: Diagnosis not present

## 2021-11-02 DIAGNOSIS — Z9181 History of falling: Secondary | ICD-10-CM | POA: Diagnosis not present

## 2021-11-02 DIAGNOSIS — C712 Malignant neoplasm of temporal lobe: Secondary | ICD-10-CM | POA: Diagnosis not present

## 2021-11-02 DIAGNOSIS — Z87891 Personal history of nicotine dependence: Secondary | ICD-10-CM | POA: Diagnosis not present

## 2021-11-02 DIAGNOSIS — C719 Malignant neoplasm of brain, unspecified: Secondary | ICD-10-CM

## 2021-11-02 DIAGNOSIS — I10 Essential (primary) hypertension: Secondary | ICD-10-CM | POA: Diagnosis not present

## 2021-11-02 DIAGNOSIS — R530 Neoplastic (malignant) related fatigue: Secondary | ICD-10-CM | POA: Diagnosis not present

## 2021-11-02 MED ORDER — GADOBUTROL 1 MMOL/ML IV SOLN
6.0000 mL | Freq: Once | INTRAVENOUS | Status: AC | PRN
  Administered 2021-11-02: 6 mL via INTRAVENOUS

## 2021-11-05 ENCOUNTER — Inpatient Hospital Stay: Payer: Medicare Other | Attending: Neurological Surgery

## 2021-11-05 DIAGNOSIS — R5383 Other fatigue: Secondary | ICD-10-CM | POA: Insufficient documentation

## 2021-11-05 DIAGNOSIS — C718 Malignant neoplasm of overlapping sites of brain: Secondary | ICD-10-CM | POA: Insufficient documentation

## 2021-11-05 DIAGNOSIS — D696 Thrombocytopenia, unspecified: Secondary | ICD-10-CM | POA: Insufficient documentation

## 2021-11-06 ENCOUNTER — Inpatient Hospital Stay (HOSPITAL_BASED_OUTPATIENT_CLINIC_OR_DEPARTMENT_OTHER): Payer: Medicare Other | Admitting: Internal Medicine

## 2021-11-06 ENCOUNTER — Inpatient Hospital Stay (HOSPITAL_BASED_OUTPATIENT_CLINIC_OR_DEPARTMENT_OTHER): Payer: Medicare Other | Admitting: Nurse Practitioner

## 2021-11-06 ENCOUNTER — Inpatient Hospital Stay: Payer: Medicare Other

## 2021-11-06 ENCOUNTER — Other Ambulatory Visit: Payer: Self-pay

## 2021-11-06 ENCOUNTER — Encounter: Payer: Self-pay | Admitting: Nurse Practitioner

## 2021-11-06 VITALS — BP 121/79 | HR 63 | Temp 97.8°F | Resp 15 | Ht 61.0 in | Wt 125.5 lb

## 2021-11-06 DIAGNOSIS — C719 Malignant neoplasm of brain, unspecified: Secondary | ICD-10-CM | POA: Diagnosis not present

## 2021-11-06 DIAGNOSIS — Z7189 Other specified counseling: Secondary | ICD-10-CM

## 2021-11-06 DIAGNOSIS — Z515 Encounter for palliative care: Secondary | ICD-10-CM

## 2021-11-06 DIAGNOSIS — R530 Neoplastic (malignant) related fatigue: Secondary | ICD-10-CM

## 2021-11-06 DIAGNOSIS — F32A Depression, unspecified: Secondary | ICD-10-CM | POA: Diagnosis not present

## 2021-11-06 DIAGNOSIS — R569 Unspecified convulsions: Secondary | ICD-10-CM

## 2021-11-06 DIAGNOSIS — R5383 Other fatigue: Secondary | ICD-10-CM | POA: Diagnosis not present

## 2021-11-06 DIAGNOSIS — D696 Thrombocytopenia, unspecified: Secondary | ICD-10-CM | POA: Diagnosis not present

## 2021-11-06 DIAGNOSIS — C718 Malignant neoplasm of overlapping sites of brain: Secondary | ICD-10-CM | POA: Diagnosis not present

## 2021-11-06 LAB — CBC WITH DIFFERENTIAL (CANCER CENTER ONLY)
Abs Immature Granulocytes: 0.11 10*3/uL — ABNORMAL HIGH (ref 0.00–0.07)
Basophils Absolute: 0 10*3/uL (ref 0.0–0.1)
Basophils Relative: 0 %
Eosinophils Absolute: 0 10*3/uL (ref 0.0–0.5)
Eosinophils Relative: 0 %
HCT: 31.8 % — ABNORMAL LOW (ref 36.0–46.0)
Hemoglobin: 11 g/dL — ABNORMAL LOW (ref 12.0–15.0)
Immature Granulocytes: 2 %
Lymphocytes Relative: 15 %
Lymphs Abs: 1 10*3/uL (ref 0.7–4.0)
MCH: 36.8 pg — ABNORMAL HIGH (ref 26.0–34.0)
MCHC: 34.6 g/dL (ref 30.0–36.0)
MCV: 106.4 fL — ABNORMAL HIGH (ref 80.0–100.0)
Monocytes Absolute: 0.3 10*3/uL (ref 0.1–1.0)
Monocytes Relative: 4 %
Neutro Abs: 5.2 10*3/uL (ref 1.7–7.7)
Neutrophils Relative %: 79 %
Platelet Count: 49 10*3/uL — ABNORMAL LOW (ref 150–400)
RBC: 2.99 MIL/uL — ABNORMAL LOW (ref 3.87–5.11)
RDW: 15.1 % (ref 11.5–15.5)
WBC Count: 6.6 10*3/uL (ref 4.0–10.5)
nRBC: 0 % (ref 0.0–0.2)

## 2021-11-06 LAB — CMP (CANCER CENTER ONLY)
ALT: 22 U/L (ref 0–44)
AST: 12 U/L — ABNORMAL LOW (ref 15–41)
Albumin: 3.2 g/dL — ABNORMAL LOW (ref 3.5–5.0)
Alkaline Phosphatase: 49 U/L (ref 38–126)
Anion gap: 4 — ABNORMAL LOW (ref 5–15)
BUN: 27 mg/dL — ABNORMAL HIGH (ref 8–23)
CO2: 29 mmol/L (ref 22–32)
Calcium: 9 mg/dL (ref 8.9–10.3)
Chloride: 103 mmol/L (ref 98–111)
Creatinine: 0.61 mg/dL (ref 0.44–1.00)
GFR, Estimated: >60 mL/min (ref >60)
Glucose, Bld: 94 mg/dL (ref 70–99)
Potassium: 3.9 mmol/L (ref 3.5–5.1)
Sodium: 136 mmol/L (ref 135–145)
Total Bilirubin: 0.7 mg/dL (ref 0.3–1.2)
Total Protein: 5.6 g/dL — ABNORMAL LOW (ref 6.5–8.1)

## 2021-11-06 MED ORDER — LAMOTRIGINE 25 MG PO TABS: 50.0000 mg | ORAL_TABLET | Freq: Every day | ORAL | 0 refills | Status: DC

## 2021-11-06 MED ORDER — LAMOTRIGINE 100 MG PO TABS: ORAL_TABLET | ORAL | 1 refills | Status: DC

## 2021-11-06 MED ORDER — DEXAMETHASONE 4 MG PO TABS: 4.0000 mg | ORAL_TABLET | Freq: Every day | ORAL | 0 refills | Status: AC
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
## 2021-11-06 NOTE — Progress Notes (Signed)
? ?  ?Palliative Medicine ?[place]  ?[phone] [phone] ? ? ?Name: Andrea [name] ?Date: 11/06/2021 ?MRN: [id number]  ?DOB: 11/08/1945 ? ?Patient Care Team: ?Saguier, Iris Pert as PCP - General (Internal Medicine)  ? ?REASON FOR CONSULTATION: ?Andrea [name] is a 76 y.o. female with medical history including high grade glioma and hypertension.  She is s/p partial brain radiation and currently on Temozolmide and dexamethasone. Palliative ask to see for symptom management and goals of care.  ? ? ?SOCIAL HISTORY:    ? reports that she quit smoking about 7 years ago. Her smoking use included cigarettes. She has never used smokeless tobacco. She reports that she does not currently use alcohol. She reports that she does not use drugs. ? ?ADVANCE DIRECTIVES:  ? ? ?CODE STATUS:  ? ?PAST MEDICAL HISTORY: ?Past Medical History:  ?Diagnosis Date  ? Brain cancer [facility])   ? Hypertension   ? ? ?PAST SURGICAL HISTORY:  ?Past Surgical History:  ?Procedure Laterality Date  ? APPLICATION OF CRANIAL NAVIGATION N/A 03/13/2021  ? Procedure: APPLICATION OF CRANIAL NAVIGATION;  Surgeon: Dawley, Theodoro Doing, DO;  Location: [place];  Service: Neurosurgery;  Laterality: N/A;  ? FRAMELESS  BIOPSY WITH BRAINLAB Right 03/13/2021  ? Procedure: Right Parietal-Occipital craniotomy, open biopsy for brain lesion;  Surgeon: Dawley, Theodoro Doing, DO;  Location: [place];  Service: Neurosurgery;  Laterality: Right;  ? ? ?HEMATOLOGY/ONCOLOGY HISTORY:  ?Oncology History  ?High grade glioma not classifiable by WHO criteria ([place])  ?03/13/2021 Surgery  ? Open biopsy by Dr. Reatha Armour; path is high grade glioma ? ?  ?03/29/2021 Cancer Staging  ? Staging form: Brain and Spinal Cord, AJCC 8th Edition ?- Pathologic: WHO Grade IV - Signed by Ventura Sellers, MD on 03/29/2021 ?Histologic grading system: 4 grade system ?Extent of surgical resection: Biopsy ?Solitary (s) or multifocal (m) tumors in the primary site: Multifocal ?Karnofsky  performance status: Score 70 ?Seizures at presentation: Present ?Duration of symptoms before diagnosis: Short ?IDH1 mutation: Negative ?  ?04/23/2021 - 05/14/2021 Radiation Therapy  ? 3 weeks IMRT and Temodar with Dr. Isidore Moos ?  ?06/18/2021 -  Chemotherapy  ? Patient is on Treatment Plan : BRAIN GLIOBLASTOMA Consolidation Temozolomide Days 1-5 q28 Days   ?   ? ? ?ALLERGIES:  is allergic to penicillins. ? ?MEDICATIONS:  ?Current Outpatient Medications  ?Medication Sig Dispense Refill  ? ALPRAZolam (XANAX) 0.25 MG tablet Take 1 tablet (0.25 mg total) by mouth 3 (three) times daily as needed. (Patient not taking: Reported on 11/06/2021) 30 tablet 0  ? benazepril-hydrochlorthiazide (LOTENSIN HCT) 10-12.5 MG tablet Take 1 tablet by mouth 2 (two) times daily.    ? COVID-19 mRNA bivalent vaccine, Moderna, (MODERNA COVID-19 BIVAL BOOSTER) 50 MCG/0.5ML injection Inject into the muscle. 0.5 mL 0  ? dexamethasone (DECADRON) 4 MG tablet Take 1 tablet (4 mg total) by mouth daily. 60 tablet 0  ? [START ON 11/20/2021] lamoTRIgine (LAMICTAL) 100 MG tablet Take 1 tablet (100 mg total) by mouth daily for 7 days, THEN 1 tablet (100 mg total) 2 (two) times daily. 60 tablet 1  ? lamoTRIgine (LAMICTAL) 25 MG tablet Take 2 tablets (50 mg total) by mouth daily. 14 tablet 0  ? levETIRAcetam (KEPPRA) 1000 MG tablet Take 1 tablet (1,000 mg total) by mouth 2 (two) times daily. 60 tablet 3  ? ondansetron (ZOFRAN) 8 MG tablet Take 1 tablet (8 mg total) by mouth 2 (two) times daily as needed (nausea and vomiting). May take 30-60  minutes prior to Temodar administration if nausea/vomiting occurs. (Patient not taking: Reported on 11/06/2021) 30 tablet 1  ? pantoprazole (PROTONIX) 40 MG tablet TAKE 1 TABLET(40 MG) BY MOUTH AT BEDTIME 30 tablet 3  ? prochlorperazine (COMPAZINE) 10 MG tablet Take 1 tablet (10 mg total) by mouth every 6 (six) hours as needed for nausea or vomiting. (Patient not taking: Reported on 11/06/2021) 30 tablet 0  ? temozolomide (TEMODAR)  100 MG capsule Take 1 capsule (100 mg total) by mouth daily. May take on an empty stomach to decrease nausea & vomiting. (Patient not taking: Reported on 11/06/2021) 5 capsule 0  ? temozolomide (TEMODAR) 140 MG capsule Take 1 capsule (140 mg total) by mouth daily. May take on an empty stomach to decrease nausea & vomiting. (Patient not taking: Reported on 11/06/2021) 5 capsule 0  ? ?No current facility-administered medications for this visit.  ? ? ?VITAL SIGNS: ?LMP  (LMP Unknown)  ?There were no vitals filed for this visit.  ?Estimated body mass index is 23.71 kg/m? as calculated from the following: ?  Height as of an earlier encounter on 11/06/21: '5\' 1"'  (1.549 m). ?  Weight as of an earlier encounter on 11/06/21: 125 lb 8 oz (56.9 kg). ? ?PERFORMANCE STATUS (ECOG) : 2 - Symptomatic, <50% confined to bed ? ?Physical Exam ?General: NAD, alert and cooperative ?Resp: normal breathing pattern ?Cardiac: RRR ?Neuro: able to follow commands and respond, some confusion/forgetfulness  ? ?IMPRESSION: ? ?Andrea [name] presents to the clinic today with her husband and daughter for follow-up.  She also was seen by Dr. Mickeal Skinner.  No acute distress noted.  Patient sitting in recliner able to engage in conversations and follow commands.   ? ?Appetite continues to do well.  Family shares concerns about patient's recent episodes of crying, significant separation anxiety, and fatigue.  Per Dr. Mickeal Skinner medication adjustments were made with the expectation of decrease behaviors.  Family verbalized understanding and appreciation. ? ?We discussed at length ongoing goals of care.  The difference between palliative and hospice were discussed. Family understands when the goal is to focus solely on comfort and not pursue further treatment options or scans hospice would be most appropriate. At this time they are clear in expressed wishes to understandably continue to treat the treatable allowing Andrea [name] every opportunity to continue to thrive.  Hopefully to restart on her oral chemo in the upcoming months.  ? ?Recommendations to establish a rapport with outpatient Palliative in the home for additional support provided. Mr. Ucci and his daughter verbalized understanding and would like to proceed with Palliative referral through [facility]).  He shares his neighbor is a Radio producer with them. We also discussed hiring private caregivers to allow family some respite time and additional support. Family aware we will continue to follow here at the [facility] in addition to [facility].  ? ?I discussed the importance of continued conversation with family and their medical providers regarding overall plan of care and treatment options, ensuring decisions are within the context of the patients values and GOCs. ? ?PLAN: ?Appetite is much improved. Continue on dexamethasone as instructed by Dr. Mickeal Skinner  ?Discussed ongoing support and goals of care discussions. Education provided on home activities and self-care. Recommendations for outpatient Palliative support in the home in addition to ongoing support through the [place]. Family in agreement with request for [facility]. Referral has been placed.  ?I will plan to see patient back in 4 weeks in collaboration to other oncology appointments.  Will also follow up in 2 weeks via My Chart visit.  ? ? ?Patient expressed understanding and was in agreement with this plan. She also understands that She can call the clinic at any time with any questions, concerns, or complaints.  ? ?Time Total: 35 min.  ? ?Visit consisted of counseling and education dealing with the complex and emotionally intense issues of symptom management and palliative care in the setting of serious and potentially life-threatening illness.Greater than 50%  of this time was spent counseling and coordinating care related to the above assessment and plan. ? ?Alda Lea, AGPCNP-BC  ?[place] ? ? ?  ?

## 2021-11-07 ENCOUNTER — Telehealth: Payer: Self-pay

## 2021-11-07 DIAGNOSIS — C712 Malignant neoplasm of temporal lobe: Secondary | ICD-10-CM | POA: Diagnosis not present

## 2021-11-07 DIAGNOSIS — Z7952 Long term (current) use of systemic steroids: Secondary | ICD-10-CM | POA: Diagnosis not present

## 2021-11-07 DIAGNOSIS — Z9181 History of falling: Secondary | ICD-10-CM | POA: Diagnosis not present

## 2021-11-07 DIAGNOSIS — R530 Neoplastic (malignant) related fatigue: Secondary | ICD-10-CM | POA: Diagnosis not present

## 2021-11-07 DIAGNOSIS — Z87891 Personal history of nicotine dependence: Secondary | ICD-10-CM | POA: Diagnosis not present

## 2021-11-07 DIAGNOSIS — I10 Essential (primary) hypertension: Secondary | ICD-10-CM | POA: Diagnosis not present

## 2021-11-07 NOTE — Telephone Encounter (Signed)
Spoke with patient's husband [name] and scheduled a Mychart Palliative Consult for 11/08/21 @ 2 PM.  ? ?Consent obtained; updated Outlook/Netsmart/Team List and Epic.  ? ? ?

## 2021-11-08 ENCOUNTER — Telehealth: Payer: Medicare Other | Admitting: Hospice

## 2021-11-08 ENCOUNTER — Other Ambulatory Visit: Payer: Self-pay

## 2021-11-08 ENCOUNTER — Encounter: Payer: Self-pay | Admitting: Internal Medicine

## 2021-11-08 ENCOUNTER — Other Ambulatory Visit (HOSPITAL_COMMUNITY): Payer: Self-pay

## 2021-11-08 DIAGNOSIS — R569 Unspecified convulsions: Secondary | ICD-10-CM

## 2021-11-08 DIAGNOSIS — R269 Unspecified abnormalities of gait and mobility: Secondary | ICD-10-CM

## 2021-11-08 DIAGNOSIS — Z87891 Personal history of nicotine dependence: Secondary | ICD-10-CM | POA: Diagnosis not present

## 2021-11-08 DIAGNOSIS — C712 Malignant neoplasm of temporal lobe: Secondary | ICD-10-CM

## 2021-11-08 DIAGNOSIS — Z515 Encounter for palliative care: Secondary | ICD-10-CM | POA: Diagnosis not present

## 2021-11-08 DIAGNOSIS — Z7952 Long term (current) use of systemic steroids: Secondary | ICD-10-CM | POA: Diagnosis not present

## 2021-11-08 DIAGNOSIS — R530 Neoplastic (malignant) related fatigue: Secondary | ICD-10-CM

## 2021-11-08 DIAGNOSIS — Z9181 History of falling: Secondary | ICD-10-CM | POA: Diagnosis not present

## 2021-11-08 DIAGNOSIS — I10 Essential (primary) hypertension: Secondary | ICD-10-CM | POA: Diagnosis not present

## 2021-11-08 NOTE — Progress Notes (Signed)
[facility] Consult Note Telephone: [phone]  Fax: [phone]  PATIENT NAME: Andrea [name] [address] [place] [address] [phone] (home)  DOB: 08-07-1946 MRN: [id number]  PRIMARY CARE PROVIDER:    [name] [facility] [address] [place] [address] [phone]  REFERRING PROVIDER:   Elise Benne [address] [facility] [address] [place],  [place] [address] [phone]  RESPONSIBLE PARTY:   Andrea [name] Contact Information     Name Relation Home Work Mobile   Andrea [name] Spouse   [phone]   Andrea [name] Daughter   [phone]       TELEHEALTH VISIT STATEMENT Due to the COVID-19 crisis, this visit was done via telemedicine from my office and it was initiated and consent by this patient and or family.  I connected with patient OR PROXY by a telephone/video  and verified that I am speaking with the correct person. I discussed the limitations of evaluation and management by telemedicine. The patient expressed understanding and agreed to proceed. Palliative Care was asked to follow this patient to address advance care planning, complex medical decision making and goals of care clarification. Andrea [name] is with patient during visit. This is the initial visit.     ASSESSMENT AND / RECOMMENDATIONS:   Advance Care Planning: Our advance care planning conversation included a discussion about:    The value and importance of advance care planning  Difference between Hospice and Palliative care Exploration of goals of care in the event of a sudden injury or illness  Identification and preparation of a healthcare agent  Review and updating or creation of an  advance directive document . Decision not to resuscitate or to de-escalate disease focused treatments due to poor prognosis.  CODE STATUS: Discussion on ramifications and implications of code status. Patient is a Do Not Resuscitate.   Goals  of Care: Goals include to maximize quality of life and symptom management. Follow-up discussion on hospice service next visit.  I spent  16 minutes providing this initial consultation. More than 50% of the time in this consultation was spent on counseling patient and coordinating communication. --------------------------------------------------------------------------------------------------------------------------------------  Symptom Management/Plan: Brain neoplasm: s/p partial brain radiation and currently on Temozolmide and dexamethasone. Zofran on hand prn nausea/vomiting.  There is plan to reassess the need for ongoing chemotherapy.  Follow-up with oncologist as planned.  Routine CBC CMP Fatigue/gait disturbance: related to brain neoplasm. PT/OT is ongoing for strengthening and gait training.  PPS 40% down from 60% 6 months ago.  Fall precautions reiterated. Balance of work and performance activity.  Seizure: On keppra, transitioning to Lamotrigine. Followed by Neurologist. Seizure precautions. Follow up: Palliative care will continue to follow for complex medical decision making, advance care planning, and clarification of goals. Return 6 weeks or prn. Encouraged to call provider sooner with any concerns.   Family /Caregiver/Community Supports: Patient lives at home with spouse. Her daughter Andrea [name] visits often and is involved in her care. Strong family support system identified.   HOSPICE ELIGIBILITY/DIAGNOSIS: TBD  Chief Complaint: Initial Palliative care visit  HISTORY OF PRESENT ILLNESS:  Andrea [name] is a 76 y.o. 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 History obtained from review of EMR, discussion with primary team, caregiver, family and/or Andrea [name].  Review and summarization of Epic records shows  history  from other than patient. Rest of 10 point ROS asked and negative.  I reviewed as needed, available labs, patient records, imaging, studies and related documents from the EMR.  Recent Labs  Lab 11/06/21 1134  WBC 6.6  HGB 11.0*  HCT 31.8*  PLT 49*  MCV 106.4*   Recent Labs  Lab 11/06/21 1134  NA 136  K 3.9  CL 103  CO2 29  BUN 27*  CREATININE 0.61  GLUCOSE 94   Latest GFR by Cockcroft Gault (not valid in AKI or ESRD) Estimated Creatinine Clearance: 45.8 mL/min (by C-G formula based on SCr of 0.61 mg/dL). Recent Labs  Lab 11/06/21 1134  AST 12*  ALT 22  ALKPHOS 49   No components found for: ALB No results for input(s): APTT, INR in the last 168 hours.  Invalid input(s): PTPATIENT No results for input(s): BNP, PROBNP in the last 168 hours.   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 ([place]) 04/13/2021   Neutropenia ([place]) 08/16/2021   Resolved Ambulatory Problems    Diagnosis Date Noted   No Resolved Ambulatory Problems   Past Medical History:  Diagnosis Date   Brain cancer ([place])    Hypertension     SOCIAL HX:  Social History   Tobacco Use   Smoking status: Former    Types: Cigarettes    Quit date: 2016    Years since quitting: 7.1   Smokeless tobacco: Never  Substance Use Topics   Alcohol use: Not Currently     FAMILY HX:  Family History  Problem Relation Age of Onset   Alcohol abuse Son    Brain cancer Neg Hx       ALLERGIES:  Allergies  Allergen Reactions   Penicillins Rash      PERTINENT MEDICATIONS:  Outpatient Encounter Medications as of 11/08/2021   Medication Sig   ALPRAZolam (XANAX) 0.25 MG tablet Take 1 tablet (0.25 mg total) by mouth 3 (three) times daily as needed. (Patient not taking: Reported on 11/06/2021)   benazepril-hydrochlorthiazide (LOTENSIN HCT) 10-12.5 MG tablet Take 1 tablet by mouth 2 (two) times daily.   COVID-19 mRNA bivalent vaccine, Moderna, (MODERNA COVID-19 BIVAL BOOSTER) 50 MCG/0.5ML injection Inject into the muscle.   dexamethasone (DECADRON) 4 MG tablet Take 1 tablet (4 mg total) by mouth daily.   [START ON 11/20/2021] lamoTRIgine (LAMICTAL) 100 MG tablet Take 1 tablet (100 mg total) by mouth daily for 7 days, THEN 1 tablet (100 mg total) 2 (two) times daily.   lamoTRIgine (LAMICTAL) 25 MG tablet Take 2 tablets (50 mg total) by mouth daily.   levETIRAcetam (KEPPRA) 1000 MG tablet Take 1 tablet (1,000 mg total) by mouth 2 (two) times daily.   ondansetron (ZOFRAN) 8 MG tablet Take 1 tablet (8 mg total) by mouth 2 (two) times daily as needed (nausea and vomiting). May take 30-60 minutes prior to Temodar administration if nausea/vomiting occurs. (Patient not taking: Reported on 11/06/2021)   pantoprazole (PROTONIX) 40 MG tablet TAKE 1 TABLET(40 MG) BY MOUTH AT BEDTIME   prochlorperazine (COMPAZINE) 10 MG tablet Take 1 tablet (10 mg total) by  mouth every 6 (six) hours as needed for nausea or vomiting. (Patient not taking: Reported on 11/06/2021)   temozolomide (TEMODAR) 100 MG capsule Take 1 capsule (100 mg total) by mouth daily. May take on an empty stomach to decrease nausea & vomiting. (Patient not taking: Reported on 11/06/2021)   temozolomide (TEMODAR) 140 MG capsule Take 1 capsule (140 mg total) by mouth daily. May take on an empty stomach to decrease nausea & vomiting. (Patient not taking: Reported on 11/06/2021)   No facility-administered encounter medications on file as of 11/08/2021.     Thank you for the opportunity to participate in the care of Ms. Naim.  The palliative care team will continue to follow. Please call  our office at [phone] if we can be of additional assistance.   Note: Portions of this note were generated with Lobbyist. Dictation errors may occur despite best attempts at proofreading.  Teodoro Spray, NP

## 2021-11-09 DIAGNOSIS — Z87891 Personal history of nicotine dependence: Secondary | ICD-10-CM | POA: Diagnosis not present

## 2021-11-09 DIAGNOSIS — I10 Essential (primary) hypertension: Secondary | ICD-10-CM | POA: Diagnosis not present

## 2021-11-09 DIAGNOSIS — C712 Malignant neoplasm of temporal lobe: Secondary | ICD-10-CM | POA: Diagnosis not present

## 2021-11-09 DIAGNOSIS — R530 Neoplastic (malignant) related fatigue: Secondary | ICD-10-CM | POA: Diagnosis not present

## 2021-11-09 DIAGNOSIS — Z7952 Long term (current) use of systemic steroids: Secondary | ICD-10-CM | POA: Diagnosis not present

## 2021-11-09 DIAGNOSIS — Z9181 History of falling: Secondary | ICD-10-CM | POA: Diagnosis not present

## 2021-11-10 DIAGNOSIS — I10 Essential (primary) hypertension: Secondary | ICD-10-CM | POA: Diagnosis not present

## 2021-11-10 DIAGNOSIS — R530 Neoplastic (malignant) related fatigue: Secondary | ICD-10-CM | POA: Diagnosis not present

## 2021-11-10 DIAGNOSIS — C712 Malignant neoplasm of temporal lobe: Secondary | ICD-10-CM | POA: Diagnosis not present

## 2021-11-10 DIAGNOSIS — Z7952 Long term (current) use of systemic steroids: Secondary | ICD-10-CM | POA: Diagnosis not present

## 2021-11-10 DIAGNOSIS — Z87891 Personal history of nicotine dependence: Secondary | ICD-10-CM | POA: Diagnosis not present

## 2021-11-10 DIAGNOSIS — Z9181 History of falling: Secondary | ICD-10-CM | POA: Diagnosis not present

## 2021-11-12 ENCOUNTER — Other Ambulatory Visit (HOSPITAL_COMMUNITY): Payer: Self-pay

## 2021-11-12 DIAGNOSIS — I10 Essential (primary) hypertension: Secondary | ICD-10-CM | POA: Diagnosis not present

## 2021-11-12 DIAGNOSIS — C712 Malignant neoplasm of temporal lobe: Secondary | ICD-10-CM | POA: Diagnosis not present

## 2021-11-12 DIAGNOSIS — Z7952 Long term (current) use of systemic steroids: Secondary | ICD-10-CM | POA: Diagnosis not present

## 2021-11-12 DIAGNOSIS — R530 Neoplastic (malignant) related fatigue: Secondary | ICD-10-CM | POA: Diagnosis not present

## 2021-11-12 DIAGNOSIS — Z87891 Personal history of nicotine dependence: Secondary | ICD-10-CM | POA: Diagnosis not present

## 2021-11-12 DIAGNOSIS — Z9181 History of falling: Secondary | ICD-10-CM | POA: Diagnosis not present

## 2021-11-13 DIAGNOSIS — Z87891 Personal history of nicotine dependence: Secondary | ICD-10-CM | POA: Diagnosis not present

## 2021-11-13 DIAGNOSIS — Z9181 History of falling: Secondary | ICD-10-CM | POA: Diagnosis not present

## 2021-11-13 DIAGNOSIS — Z7952 Long term (current) use of systemic steroids: Secondary | ICD-10-CM | POA: Diagnosis not present

## 2021-11-13 DIAGNOSIS — C712 Malignant neoplasm of temporal lobe: Secondary | ICD-10-CM | POA: Diagnosis not present

## 2021-11-13 DIAGNOSIS — I10 Essential (primary) hypertension: Secondary | ICD-10-CM | POA: Diagnosis not present

## 2021-11-13 DIAGNOSIS — Z20822 Contact with and (suspected) exposure to covid-19: Secondary | ICD-10-CM | POA: Diagnosis not present

## 2021-11-13 DIAGNOSIS — R530 Neoplastic (malignant) related fatigue: Secondary | ICD-10-CM | POA: Diagnosis not present

## 2021-11-15 ENCOUNTER — Telehealth: Payer: Self-pay

## 2021-11-15 ENCOUNTER — Other Ambulatory Visit: Payer: Self-pay

## 2021-11-15 ENCOUNTER — Telehealth: Payer: Self-pay | Admitting: Medical

## 2021-11-15 ENCOUNTER — Other Ambulatory Visit: Payer: Medicare Other | Admitting: Hospice

## 2021-11-15 DIAGNOSIS — C712 Malignant neoplasm of temporal lobe: Secondary | ICD-10-CM | POA: Diagnosis not present

## 2021-11-15 DIAGNOSIS — Z87891 Personal history of nicotine dependence: Secondary | ICD-10-CM | POA: Diagnosis not present

## 2021-11-15 DIAGNOSIS — F0390 Unspecified dementia without behavioral disturbance: Secondary | ICD-10-CM | POA: Diagnosis not present

## 2021-11-15 DIAGNOSIS — Z7952 Long term (current) use of systemic steroids: Secondary | ICD-10-CM | POA: Diagnosis not present

## 2021-11-15 DIAGNOSIS — I10 Essential (primary) hypertension: Secondary | ICD-10-CM | POA: Diagnosis not present

## 2021-11-15 DIAGNOSIS — R5381 Other malaise: Secondary | ICD-10-CM | POA: Diagnosis not present

## 2021-11-15 DIAGNOSIS — Z9181 History of falling: Secondary | ICD-10-CM | POA: Diagnosis not present

## 2021-11-15 DIAGNOSIS — Z515 Encounter for palliative care: Secondary | ICD-10-CM | POA: Diagnosis not present

## 2021-11-15 DIAGNOSIS — R530 Neoplastic (malignant) related fatigue: Secondary | ICD-10-CM | POA: Diagnosis not present

## 2021-11-15 NOTE — Telephone Encounter (Signed)
I called Mr. Montoro at the request of his team, as he had reached out wanting to speak to our office. He stated that he had originally called to ask about additional support, as it is getting harder to ambulate Mrs. Klemz. He stated that she is very weak and stays in the bed most of the day now.  ?He stated this past week, Mrs. Lackey sat on the edge of the bed and then fell down to the floor. He had to call EMS, where two firemen came to pick her up and place her in a wheelchair to take her to the bathroom and then place her back in bed. ?He stated that [facility] came to their house today and they have decided to transition Mrs. Calix to Hospice care. We will follow up with [facility] today.  ?All questions answered. Appreciation verbalized. Advised to call our office with any questions/concerns.  ?

## 2021-11-15 NOTE — Telephone Encounter (Signed)
Notified [place]  ?

## 2021-11-15 NOTE — Progress Notes (Signed)
? ? ?Manufacturing [facility] ?Community Palliative Care Consult Note ?Telephone: [phone]  ?Fax: [phone] ? ?PATIENT NAME: Andrea [name] ?[place] [address] ?[phone] (home)  ?DOB: 11/26/1945 ?MRN: [id number] ? ?PRIMARY CARE PROVIDER:    ?Elise Benne,  ?[address] ?[place] [address] ?[phone] ? ?REFERRING PROVIDER:   ?Mackie Pai, PA-C ?[address] ?[address] ?[place],  [place] [address] ?[phone] ? ?RESPONSIBLE PARTY:   [name]/[name] ?Contact Information   ? ? Name Relation Home Work Mobile  ? Mccole,[name] Spouse   [phone]  ? [name],[name] Daughter   [phone]  ? ?  ? ?Palliative Care was asked to follow this patient to address advance care planning, complex medical decision making and goals of care clarification. [name] is with patient during visit.  NP called [name] and updated on visit; she is in agreement for hospice service for patient. ? ?  ASSESSMENT AND / RECOMMENDATIONS:  ? ?Advance Care Planning: Our advance care planning conversation included a discussion about:    ?Decision not to resuscitate and to de-escalate disease focused treatments due to poor prognosis. ?No more chemo/radiation, no hospitalization, no more PT/OT.  ? ?CODE STATUS: Patient is a Do Not Resuscitate.  ? ?Goals of Care: Goals include to maximize quality of life and symptom management. ?Family desires hospice service.  Collaborative discussions with PCP's office and [facility] CMO on hospice eligibility. ? ?I spent 46 minutes providing this initial consultation. More than 50% of the time in this consultation was spent on counseling patient and coordinating communication. ?-------------------------------------------------------------------------------------------------------------------------------------- ? ?Symptom Management/Plan: ?Brain neoplasm: s/p partial brain radiation and chemotherapy.  Family has decided not to continue with chemo/radiation.    ?Fatigue/gait disturbance: related to brain neoplasm.  Family reports PT/OT is not helpful and has decided to discontinue.  ? PPS is weak 40% down from 60% 6 months ago.  Fall precautions reiterated.  ?Dementia: Ongoing memory loss/confusion. Impoverished thoughts, impaired judgement, word-finding difficulty. Encourage reminiscence, word search/puzzles, cueing for recollection.  Promote calm approach and engaging environment.  Fast 7A.  Optimize ongoing supportive care. ?Seizure: On keppra, transitioning to Lamotrigine. Followed by Neurologist. Seizure precautions. ?Follow up: Palliative care will continue to follow for complex medical decision making, advance care planning, and clarification of goals. Return 6 weeks or prn. Encouraged to call provider sooner with any concerns.  ? ?Family /Caregiver/Community Supports: Patient lives at home with spouse. Her daughter [name] visits often and is involved in her care. Strong family support system identified.  ? ?HOSPICE ELIGIBILITY/DIAGNOSIS: TBD ? ?Chief Complaint: Follow up visit ? ?HISTORY OF PRESENT ILLNESS:  Andrea [name] is a 76 y.o. 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 Patient in the last 6 months have declined from PPS 60% To currently a weak 40%, sleeping more up to 18 hours a day, preferring to stay in bed due worsening weakness not helped by PT. Family has decided to call off PT and no more chemo/radiation. They are requesting hospice service.  Patient in no acute distress, endorses weakness, denies pain/discomfort.  History obtained from review of EMR, discussion with primary team, caregiver, family and/or Ms. Lizaola.  ?Review and summarization of Epic records shows history from other than patient. Rest of 10 point ROS asked and  negative.  ?Independent interpretation of tests,  and reviewed as needed, available labs, patient records, imaging, studies and related documents from the EMR. ? ? ?PAST MEDICAL HISTORY:  ?Active Ambulatory Problems  ?  Diagnosis Date Noted  ? Seizure ([place]) 03/06/2021  ? Unresponsive   ? Hypertensive urgency   ? Acute respiratory failure with hypoxia ([place])   ? Hypokalemia   ? Gram-negative bacteremia 03/09/2021  ? Leukocytosis 03/09/2021  ? Brain abscess 03/09/2021  ? 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 Neutropenia ([place]) 08/16/2021  ? ?Resolved Ambulatory Problems  ?  Diagnosis Date Noted  ? No Resolved Ambulatory Problems  ? ?Past Medical History:  ?Diagnosis Date  ? Brain cancer [facility])   ? Hypertension   ? ? ?SOCIAL HX:  ?Social History  ? ?Tobacco Use  ? Smoking status: Former  ?  Types: Cigarettes  ?  Quit date: 2016  ?  Years since quitting: 7.2  ? Smokeless tobacco: Never  ?Substance Use Topics  ? Alcohol use: Not Currently  ? ?  ?FAMILY HX:  ?Family History  ?Problem Relation Age of Onset  ? Alcohol abuse Son   ? Brain cancer Neg Hx   ?   ? ?ALLERGIES:  ?Allergies  ?Allergen Reactions  ? Penicillins Rash  ?   ? ?PERTINENT MEDICATIONS:  ?Outpatient Encounter Medications as of 11/15/2021  ?Medication Sig  ? ALPRAZolam (XANAX) 0.25 MG tablet Take 1 tablet (0.25 mg total) by mouth 3 (three) times daily as needed. (Patient not taking: Reported on 11/06/2021)  ? benazepril-hydrochlorthiazide (LOTENSIN HCT) 10-12.5 MG tablet Take 1 tablet by mouth 2 (two) times daily.  ? COVID-19 mRNA bivalent vaccine, Moderna, (MODERNA COVID-19 BIVAL BOOSTER) 50 MCG/0.5ML injection Inject into the muscle.  ? dexamethasone (DECADRON) 4 MG tablet Take 1 tablet (4 mg total) by mouth daily.  ? [START ON 11/20/2021] lamoTRIgine (LAMICTAL) 100 MG tablet Take 1 tablet (100 mg total) by mouth daily for 7 days, THEN 1 tablet (100 mg total) 2 (two)  times daily.  ? lamoTRIgine (LAMICTAL) 25 MG tablet Take 2 tablets (50 mg total) by mouth daily.  ? levETIRAcetam (KEPPRA) 1000 MG tablet Take 1 tablet (1,000 mg total) by mouth 2 (two) times daily.  ? ondansetron (ZOFRAN) 8 MG tablet Take 1 tablet (8 mg total) by mouth 2 (two) times daily as needed (nausea and vomiting). May take 30-60 minutes prior to Temodar administration if nausea/vomiting occurs. (Patient not taking: Reported on 11/06/2021)  ? pantoprazole (PROTONIX) 40 MG tablet TAKE 1 TABLET(40 MG) BY MOUTH AT BEDTIME  ? prochlorperazine (COMPAZINE) 10 MG tablet Take 1 tablet (10 mg total) by mouth every 6 (six) hours as needed for nausea or vomiting. (Patient not taking: Reported on 11/06/2021)  ? temozolomide (TEMODAR) 100 MG capsule Take 1 capsule (100 mg total) by mouth daily. May take on an empty stomach to decrease nausea & vomiting. (Patient not taking: Reported on 11/06/2021)  ? temozolomide (TEMODAR) 140 MG capsule Take 1 capsule (140 mg total) by mouth daily. May take on an empty stomach to decrease nausea & vomiting. (Patient not taking: Reported on 11/06/2021)  ? ?No facility-administered encounter medications on file as of 11/15/2021.  ? ? ? ?Thank you for the opportunity to participate in the care of Ms. Kniss.  The palliative care team will continue to follow. Please call our office at [phone] if we can be of additional assistance.  ? ?Note: Portions of this  note were generated with Lobbyist. Dictation errors may occur despite best attempts at proofreading. ? ?Teodoro Spray, NP  ? ?  ?

## 2021-11-15 NOTE — Telephone Encounter (Signed)
Windy Canny the NP for Palliative care is calling to let Percell Miller know that the patient's health has declined and the family is requesting Hospice care. Windy Canny would like to make sure Percell Miller is ok with it and ask if he would continue being the hospice care provider. She can be reached at [phone].  ?

## 2021-11-16 ENCOUNTER — Other Ambulatory Visit: Payer: Self-pay | Admitting: Internal Medicine

## 2021-11-16 ENCOUNTER — Telehealth: Payer: Self-pay | Admitting: *Deleted

## 2021-11-16 DIAGNOSIS — Z87891 Personal history of nicotine dependence: Secondary | ICD-10-CM | POA: Diagnosis not present

## 2021-11-16 DIAGNOSIS — G40909 Epilepsy, unspecified, not intractable, without status epilepticus: Secondary | ICD-10-CM | POA: Diagnosis not present

## 2021-11-16 DIAGNOSIS — K219 Gastro-esophageal reflux disease without esophagitis: Secondary | ICD-10-CM | POA: Diagnosis not present

## 2021-11-16 DIAGNOSIS — C711 Malignant neoplasm of frontal lobe: Secondary | ICD-10-CM | POA: Diagnosis not present

## 2021-11-16 DIAGNOSIS — I10 Essential (primary) hypertension: Secondary | ICD-10-CM | POA: Diagnosis not present

## 2021-11-16 MED ORDER — LAMOTRIGINE 25 MG PO TABS: 50.0000 mg | ORAL_TABLET | Freq: Every day | ORAL | 2 refills | Status: AC

## 2021-11-16 NOTE — Telephone Encounter (Signed)
TCT [name], pt's daughter. Advised that Dr. Mickeal Skinner said to stay @ the 50 mg dosage of the Lamictal. A new prescription has been sent to the patient's pharmacy. ? ? ?[name] asked about the Keppra-she wasn't sure what she should do about the Keppra. ? ?Please advise ?

## 2021-11-16 NOTE — Telephone Encounter (Signed)
Received message from Dr. Mickeal Skinner about pt's Keppra. He is stopping the Keppra. ?TCT pt's daughter and spoke to her. Advised that Dr. Lorenso Courier said to stop the Keppra. Daughter voiced understanding. ?

## 2021-11-16 NOTE — Telephone Encounter (Signed)
Received vm message from pt's daughter-she is concerned about the transition from keppra to [place]. ?[place] and spoke with her. She states that after the first dose of 100 mg on 11/14/21, she noticed a change in her mother-she was unable to walk, she was not clear mentally, somewhat glassy eyed. ?Pt slid from the edge of the bed to the floor and EMS was called. They assisted pt to bathroom and back to bed. ?Andrea [name] is wondering if they should go back to the 50 mg dose. Instead of the 100 mg. ?Hospice services are starting today. ? ?Please advise. ?

## 2021-11-19 ENCOUNTER — Telehealth: Payer: Self-pay | Admitting: *Deleted

## 2021-11-19 ENCOUNTER — Other Ambulatory Visit (HOSPITAL_COMMUNITY): Payer: Self-pay

## 2021-11-19 DIAGNOSIS — Z87891 Personal history of nicotine dependence: Secondary | ICD-10-CM | POA: Diagnosis not present

## 2021-11-19 DIAGNOSIS — C711 Malignant neoplasm of frontal lobe: Secondary | ICD-10-CM | POA: Diagnosis not present

## 2021-11-19 DIAGNOSIS — G40909 Epilepsy, unspecified, not intractable, without status epilepticus: Secondary | ICD-10-CM | POA: Diagnosis not present

## 2021-11-19 DIAGNOSIS — K219 Gastro-esophageal reflux disease without esophagitis: Secondary | ICD-10-CM | POA: Diagnosis not present

## 2021-11-19 DIAGNOSIS — I10 Essential (primary) hypertension: Secondary | ICD-10-CM | POA: Diagnosis not present

## 2021-11-19 NOTE — Telephone Encounter (Signed)
Received call from [facility] with [facility] [phone].  She stated that patient's family called to start hospice and wanted to know if Dr Mickeal Skinner was agreeable to be the attending. ? ?Per Dr Mickeal Skinner he is agreeable.  [facility] was notified. ?

## 2021-11-20 ENCOUNTER — Encounter: Payer: Self-pay | Admitting: Nurse Practitioner

## 2021-11-20 ENCOUNTER — Inpatient Hospital Stay (HOSPITAL_BASED_OUTPATIENT_CLINIC_OR_DEPARTMENT_OTHER): Payer: Medicare Other | Admitting: Nurse Practitioner

## 2021-11-20 DIAGNOSIS — Z7189 Other specified counseling: Secondary | ICD-10-CM

## 2021-11-20 DIAGNOSIS — C719 Malignant neoplasm of brain, unspecified: Secondary | ICD-10-CM | POA: Diagnosis not present

## 2021-11-20 DIAGNOSIS — Z515 Encounter for palliative care: Secondary | ICD-10-CM | POA: Diagnosis not present

## 2021-11-20 DIAGNOSIS — R630 Anorexia: Secondary | ICD-10-CM

## 2021-11-20 NOTE — Progress Notes (Signed)
? ?  ?Palliative Medicine ?[place]  ?[phone] [phone] ? ? ?Name: Andrea [name] ?Date: 11/20/2021 ?MRN: [id number]  ?DOB: [birth year] ? ?Patient Care Team: ?Saguier, Iris Pert as PCP - General (Internal Medicine)  ? ?I connected with Nelwyn Hebdon on 11/20/21 at  2:00 PM EDT by MyChart video and verified that I am speaking with the correct person using two identifiers.  ? ?I discussed the limitations, risks, security and privacy concerns of performing an evaluation and management service by telemedicine and the availability of in-person appointments. I also discussed with the patient that there may be a patient responsible charge related to this service. The patient expressed understanding and agreed to proceed.  ? ?Other persons participating in the visit and their role in the encounter: Izetta Sakamoto  ? ?Patient?s location: Home   ?Provider?s location: [place]   ? ?REASON FOR CONSULTATION: ?Andrea [name] is a 76 y.o. female with medical history including high grade glioma and hypertension.  She is s/p partial brain radiation and currently on Temozolmide and dexamethasone. Palliative ask to see for symptom management and goals of care.  ? ? ?SOCIAL HISTORY:    ? reports that she quit smoking about 7 years ago. Her smoking use included cigarettes. She has never used smokeless tobacco. She reports that she does not currently use alcohol. She reports that she does not use drugs. ? ?ADVANCE DIRECTIVES:  ? ? ?CODE STATUS:  ? ?PAST MEDICAL HISTORY: ?Past Medical History:  ?Diagnosis Date  ? Brain cancer [facility])   ? Hypertension   ? ? ?PAST SURGICAL HISTORY:  ?Past Surgical History:  ?Procedure Laterality Date  ? APPLICATION OF CRANIAL NAVIGATION N/A 03/13/2021  ? Procedure: APPLICATION OF CRANIAL NAVIGATION;  Surgeon: Dawley, Theodoro Doing, DO;  Location: [place];  Service: Neurosurgery;  Laterality: N/A;  ? FRAMELESS  BIOPSY WITH BRAINLAB Right 03/13/2021  ? Procedure: Right  Parietal-Occipital craniotomy, open biopsy for brain lesion;  Surgeon: Dawley, Theodoro Doing, DO;  Location: [place];  Service: Neurosurgery;  Laterality: Right;  ? ? ?HEMATOLOGY/ONCOLOGY HISTORY:  ?Oncology History  ?High grade glioma not classifiable by WHO criteria ([place])  ?03/13/2021 Surgery  ? Open biopsy by Dr. Reatha Armour; path is high grade glioma ? ?  ?03/29/2021 Cancer Staging  ? Staging form: Brain and Spinal Cord, AJCC 8th Edition ?- Pathologic: WHO Grade IV - Signed by Ventura Sellers, MD on 03/29/2021 ?Histologic grading system: 4 grade system ?Extent of surgical resection: Biopsy ?Solitary (s) or multifocal (m) tumors in the primary site: Multifocal ?Karnofsky performance status: Score 70 ?Seizures at presentation: Present ?Duration of symptoms before diagnosis: Short ?IDH1 mutation: Negative ?  ?04/23/2021 - 05/14/2021 Radiation Therapy  ? 3 weeks IMRT and Temodar with Dr. Isidore Moos ?  ?06/18/2021 -  Chemotherapy  ? Patient is on Treatment Plan : BRAIN GLIOBLASTOMA Consolidation Temozolomide Days 1-5 q28 Days   ?   ? ? ?ALLERGIES:  is allergic to penicillins. ? ?MEDICATIONS:  ?Current Outpatient Medications  ?Medication Sig Dispense Refill  ? ALPRAZolam (XANAX) 0.25 MG tablet Take 1 tablet (0.25 mg total) by mouth 3 (three) times daily as needed. (Patient not taking: Reported on 11/06/2021) 30 tablet 0  ? benazepril-hydrochlorthiazide (LOTENSIN HCT) 10-12.5 MG tablet Take 1 tablet by mouth 2 (two) times daily.    ? COVID-19 mRNA bivalent vaccine, Moderna, (MODERNA COVID-19 BIVAL BOOSTER) 50 MCG/0.5ML injection Inject into the muscle. 0.5 mL 0  ? dexamethasone (DECADRON) 4 MG tablet Take 1 tablet (4  mg total) by mouth daily. 60 tablet 0  ? lamoTRIgine (LAMICTAL) 25 MG tablet Take 2 tablets (50 mg total) by mouth daily. 60 tablet 2  ? levETIRAcetam (KEPPRA) 1000 MG tablet Take 1 tablet (1,000 mg total) by mouth 2 (two) times daily. 60 tablet 3  ? ondansetron (ZOFRAN) 8 MG tablet Take 1 tablet (8 mg total) by mouth 2 (two)  times daily as needed (nausea and vomiting). May take 30-60 minutes prior to Temodar administration if nausea/vomiting occurs. (Patient not taking: Reported on 11/06/2021) 30 tablet 1  ? pantoprazole (PROTONIX) 40 MG tablet TAKE 1 TABLET(40 MG) BY MOUTH AT BEDTIME 30 tablet 3  ? prochlorperazine (COMPAZINE) 10 MG tablet Take 1 tablet (10 mg total) by mouth every 6 (six) hours as needed for nausea or vomiting. (Patient not taking: Reported on 11/06/2021) 30 tablet 0  ? temozolomide (TEMODAR) 100 MG capsule Take 1 capsule (100 mg total) by mouth daily. May take on an empty stomach to decrease nausea & vomiting. (Patient not taking: Reported on 11/06/2021) 5 capsule 0  ? temozolomide (TEMODAR) 140 MG capsule Take 1 capsule (140 mg total) by mouth daily. May take on an empty stomach to decrease nausea & vomiting. (Patient not taking: Reported on 11/06/2021) 5 capsule 0  ? ?No current facility-administered medications for this visit.  ? ? ?VITAL SIGNS: ?LMP  (LMP Unknown)  ?There were no vitals filed for this visit.  ?Estimated body mass index is 23.71 kg/m? as calculated from the following: ?  Height as of 11/06/21: _0  (1.549 m). ?  Weight as of 11/06/21: 125 lb 8 oz (56.9 kg). ? ?PERFORMANCE STATUS (ECOG) : 3 - Symptomatic, >50% confined to bed ? ?IMPRESSION: ? ?I connected with Mr and Mrs. Lehr via [employer] for follow-up and ongoing support. Mrs. Solana is now mainly bedbound . She is able to get out of the bed and pivot to bedside commode at times. Family made a decision after connecting with outpatient Palliative to transition care to hospice instead. Patient now has hospital bed in the home and foley catheter which was inserted by [facility] due to limited mobility.  ? ?Mr. Crespo shares she sleeps intermittently. Appetite continues to diminish. He and his daughter continue to provide 24/7 care with support from [place].  ? ?Confirms DNR/DNI. Mr. Dang also provided me with copies of patient's advanced  directives. Documents reviewed and placed on file.  ? ?Family is aware if needed we are available by phone however we do not need to schedule any future visits. Mr. Mette verbalized understanding and appreciation of ongoing support.  ? ?PLAN: ?Mrs. Minor has transitioned to home hospice with [employer].  ?No future appointments. Family is aware we are available if needed.  ? ?Mr. Shonk expressed understanding and was in agreement with this plan. He also understands that family can call the clinic at any time with any questions, concerns, or complaints.  ? ?Time Total: 30 min.  ? ?Visit consisted of counseling and education dealing with the complex and emotionally intense issues of symptom management and palliative care in the setting of serious and potentially life-threatening illness.Greater than 50%  of this time was spent counseling and coordinating care related to the above assessment and plan. ? ?Alda Lea, AGPCNP-BC  ?[place] ? ? ? ? ?  ?

## 2021-11-21 DIAGNOSIS — G40909 Epilepsy, unspecified, not intractable, without status epilepticus: Secondary | ICD-10-CM | POA: Diagnosis not present

## 2021-11-21 DIAGNOSIS — C711 Malignant neoplasm of frontal lobe: Secondary | ICD-10-CM | POA: Diagnosis not present

## 2021-11-21 DIAGNOSIS — Z87891 Personal history of nicotine dependence: Secondary | ICD-10-CM | POA: Diagnosis not present

## 2021-11-21 DIAGNOSIS — I10 Essential (primary) hypertension: Secondary | ICD-10-CM | POA: Diagnosis not present

## 2021-11-21 DIAGNOSIS — K219 Gastro-esophageal reflux disease without esophagitis: Secondary | ICD-10-CM | POA: Diagnosis not present

## 2021-11-22 ENCOUNTER — Other Ambulatory Visit: Payer: Self-pay

## 2021-11-22 DIAGNOSIS — G40909 Epilepsy, unspecified, not intractable, without status epilepticus: Secondary | ICD-10-CM | POA: Diagnosis not present

## 2021-11-22 DIAGNOSIS — Z87891 Personal history of nicotine dependence: Secondary | ICD-10-CM | POA: Diagnosis not present

## 2021-11-22 DIAGNOSIS — C711 Malignant neoplasm of frontal lobe: Secondary | ICD-10-CM | POA: Diagnosis not present

## 2021-11-22 DIAGNOSIS — K219 Gastro-esophageal reflux disease without esophagitis: Secondary | ICD-10-CM | POA: Diagnosis not present

## 2021-11-22 DIAGNOSIS — I10 Essential (primary) hypertension: Secondary | ICD-10-CM | POA: Diagnosis not present

## 2021-11-23 DIAGNOSIS — Z87891 Personal history of nicotine dependence: Secondary | ICD-10-CM | POA: Diagnosis not present

## 2021-11-23 DIAGNOSIS — C711 Malignant neoplasm of frontal lobe: Secondary | ICD-10-CM | POA: Diagnosis not present

## 2021-11-23 DIAGNOSIS — K219 Gastro-esophageal reflux disease without esophagitis: Secondary | ICD-10-CM | POA: Diagnosis not present

## 2021-11-23 DIAGNOSIS — G40909 Epilepsy, unspecified, not intractable, without status epilepticus: Secondary | ICD-10-CM | POA: Diagnosis not present

## 2021-11-23 DIAGNOSIS — I10 Essential (primary) hypertension: Secondary | ICD-10-CM | POA: Diagnosis not present

## 2021-11-24 DIAGNOSIS — C711 Malignant neoplasm of frontal lobe: Secondary | ICD-10-CM | POA: Diagnosis not present

## 2021-11-24 DIAGNOSIS — I10 Essential (primary) hypertension: Secondary | ICD-10-CM | POA: Diagnosis not present

## 2021-11-24 DIAGNOSIS — Z87891 Personal history of nicotine dependence: Secondary | ICD-10-CM | POA: Diagnosis not present

## 2021-11-24 DIAGNOSIS — K219 Gastro-esophageal reflux disease without esophagitis: Secondary | ICD-10-CM | POA: Diagnosis not present

## 2021-11-24 DIAGNOSIS — G40909 Epilepsy, unspecified, not intractable, without status epilepticus: Secondary | ICD-10-CM | POA: Diagnosis not present

## 2021-11-25 DIAGNOSIS — C711 Malignant neoplasm of frontal lobe: Secondary | ICD-10-CM | POA: Diagnosis not present

## 2021-11-25 DIAGNOSIS — G40909 Epilepsy, unspecified, not intractable, without status epilepticus: Secondary | ICD-10-CM | POA: Diagnosis not present

## 2021-11-25 DIAGNOSIS — I10 Essential (primary) hypertension: Secondary | ICD-10-CM | POA: Diagnosis not present

## 2021-11-25 DIAGNOSIS — Z87891 Personal history of nicotine dependence: Secondary | ICD-10-CM | POA: Diagnosis not present

## 2021-11-25 DIAGNOSIS — K219 Gastro-esophageal reflux disease without esophagitis: Secondary | ICD-10-CM | POA: Diagnosis not present

## 2021-11-26 DIAGNOSIS — Z87891 Personal history of nicotine dependence: Secondary | ICD-10-CM | POA: Diagnosis not present

## 2021-11-26 DIAGNOSIS — I10 Essential (primary) hypertension: Secondary | ICD-10-CM | POA: Diagnosis not present

## 2021-11-26 DIAGNOSIS — G40909 Epilepsy, unspecified, not intractable, without status epilepticus: Secondary | ICD-10-CM | POA: Diagnosis not present

## 2021-11-26 DIAGNOSIS — K219 Gastro-esophageal reflux disease without esophagitis: Secondary | ICD-10-CM | POA: Diagnosis not present

## 2021-11-26 DIAGNOSIS — C711 Malignant neoplasm of frontal lobe: Secondary | ICD-10-CM | POA: Diagnosis not present

## 2021-11-27 DIAGNOSIS — G40909 Epilepsy, unspecified, not intractable, without status epilepticus: Secondary | ICD-10-CM | POA: Diagnosis not present

## 2021-11-27 DIAGNOSIS — I10 Essential (primary) hypertension: Secondary | ICD-10-CM | POA: Diagnosis not present

## 2021-11-27 DIAGNOSIS — K219 Gastro-esophageal reflux disease without esophagitis: Secondary | ICD-10-CM | POA: Diagnosis not present

## 2021-11-27 DIAGNOSIS — Z87891 Personal history of nicotine dependence: Secondary | ICD-10-CM | POA: Diagnosis not present

## 2021-11-27 DIAGNOSIS — C711 Malignant neoplasm of frontal lobe: Secondary | ICD-10-CM | POA: Diagnosis not present

## 2021-11-28 DIAGNOSIS — G40909 Epilepsy, unspecified, not intractable, without status epilepticus: Secondary | ICD-10-CM | POA: Diagnosis not present

## 2021-11-28 DIAGNOSIS — C711 Malignant neoplasm of frontal lobe: Secondary | ICD-10-CM | POA: Diagnosis not present

## 2021-11-28 DIAGNOSIS — K219 Gastro-esophageal reflux disease without esophagitis: Secondary | ICD-10-CM | POA: Diagnosis not present

## 2021-11-28 DIAGNOSIS — Z87891 Personal history of nicotine dependence: Secondary | ICD-10-CM | POA: Diagnosis not present

## 2021-11-28 DIAGNOSIS — I10 Essential (primary) hypertension: Secondary | ICD-10-CM | POA: Diagnosis not present

## 2021-12-01 DEATH — deceased

## 2021-12-04 ENCOUNTER — Other Ambulatory Visit (HOSPITAL_COMMUNITY): Payer: Self-pay

## 2021-12-06 ENCOUNTER — Inpatient Hospital Stay: Payer: Medicare Other | Admitting: Internal Medicine

## 2021-12-06 ENCOUNTER — Inpatient Hospital Stay: Payer: Medicare Other

## 2021-12-06 ENCOUNTER — Inpatient Hospital Stay: Payer: Medicare Other | Admitting: Nurse Practitioner

## 2021-12-12 ENCOUNTER — Other Ambulatory Visit: Payer: Self-pay | Admitting: Internal Medicine
